# Patient Record
Sex: Female | Born: 1950 | Race: Black or African American | Hispanic: No | Marital: Married | Smoking: Never smoker
Health system: Southern US, Community
[De-identification: ages and names within clinical notes are randomized; demographics above are authoritative.]

## PROBLEM LIST (undated history)

## (undated) DIAGNOSIS — E079 Disorder of thyroid, unspecified: Secondary | ICD-10-CM

## (undated) DIAGNOSIS — N189 Chronic kidney disease, unspecified: Secondary | ICD-10-CM

## (undated) DIAGNOSIS — F329 Major depressive disorder, single episode, unspecified: Secondary | ICD-10-CM

## (undated) DIAGNOSIS — E039 Hypothyroidism, unspecified: Secondary | ICD-10-CM

## (undated) DIAGNOSIS — M1990 Unspecified osteoarthritis, unspecified site: Secondary | ICD-10-CM

## (undated) DIAGNOSIS — K219 Gastro-esophageal reflux disease without esophagitis: Secondary | ICD-10-CM

## (undated) DIAGNOSIS — E785 Hyperlipidemia, unspecified: Secondary | ICD-10-CM

## (undated) DIAGNOSIS — F32A Depression, unspecified: Secondary | ICD-10-CM

## (undated) DIAGNOSIS — Z98811 Dental restoration status: Secondary | ICD-10-CM

## (undated) HISTORY — PX: ABDOMINAL HYSTERECTOMY: SHX81

## (undated) HISTORY — DX: Gastro-esophageal reflux disease without esophagitis: K21.9

## (undated) HISTORY — DX: Hyperlipidemia, unspecified: E78.5

## (undated) HISTORY — DX: Major depressive disorder, single episode, unspecified: F32.9

## (undated) HISTORY — DX: Disorder of thyroid, unspecified: E07.9

## (undated) HISTORY — PX: BILATERAL OOPHORECTOMY: SHX1221

## (undated) HISTORY — DX: Depression, unspecified: F32.A

## (undated) HISTORY — PX: KNEE ARTHROSCOPY: SHX127

## (undated) HISTORY — PX: OTHER SURGICAL HISTORY: SHX169

## (undated) HISTORY — PX: TONSILLECTOMY: SUR1361

## (undated) HISTORY — DX: Unspecified osteoarthritis, unspecified site: M19.90

---

## 2005-06-03 ENCOUNTER — Ambulatory Visit: Payer: Self-pay

## 2006-05-12 ENCOUNTER — Ambulatory Visit: Payer: Self-pay | Admitting: Family Medicine

## 2006-06-02 ENCOUNTER — Ambulatory Visit (HOSPITAL_COMMUNITY): Admission: RE | Admit: 2006-06-02 | Discharge: 2006-06-02 | Payer: Self-pay | Admitting: Gynecology

## 2006-06-22 ENCOUNTER — Ambulatory Visit: Payer: Self-pay | Admitting: Family Medicine

## 2006-07-15 ENCOUNTER — Ambulatory Visit: Payer: Self-pay | Admitting: Gastroenterology

## 2006-09-15 ENCOUNTER — Ambulatory Visit: Payer: Self-pay | Admitting: Family Medicine

## 2006-11-07 ENCOUNTER — Ambulatory Visit: Payer: Self-pay | Admitting: Family Medicine

## 2007-06-16 DIAGNOSIS — D259 Leiomyoma of uterus, unspecified: Secondary | ICD-10-CM | POA: Insufficient documentation

## 2007-06-23 ENCOUNTER — Ambulatory Visit: Payer: Self-pay | Admitting: Family Medicine

## 2007-12-30 ENCOUNTER — Ambulatory Visit: Payer: Self-pay | Admitting: Family Medicine

## 2008-02-01 ENCOUNTER — Ambulatory Visit: Payer: Self-pay | Admitting: Specialist

## 2008-07-04 ENCOUNTER — Ambulatory Visit: Payer: Self-pay | Admitting: Family Medicine

## 2008-09-07 ENCOUNTER — Ambulatory Visit: Payer: Self-pay | Admitting: Family Medicine

## 2008-09-12 ENCOUNTER — Encounter: Payer: Self-pay | Admitting: Family Medicine

## 2008-10-02 ENCOUNTER — Encounter: Payer: Self-pay | Admitting: Family Medicine

## 2008-10-30 ENCOUNTER — Encounter: Payer: Self-pay | Admitting: Family Medicine

## 2008-12-15 ENCOUNTER — Ambulatory Visit: Payer: Self-pay | Admitting: Family Medicine

## 2008-12-27 ENCOUNTER — Ambulatory Visit: Payer: Self-pay | Admitting: Family Medicine

## 2009-01-22 ENCOUNTER — Encounter: Payer: Self-pay | Admitting: Orthopedic Surgery

## 2009-01-30 ENCOUNTER — Encounter: Payer: Self-pay | Admitting: Orthopedic Surgery

## 2009-03-01 ENCOUNTER — Encounter: Payer: Self-pay | Admitting: Orthopedic Surgery

## 2009-07-05 ENCOUNTER — Ambulatory Visit: Payer: Self-pay | Admitting: Family Medicine

## 2009-08-17 ENCOUNTER — Ambulatory Visit: Payer: Self-pay | Admitting: Specialist

## 2009-08-29 ENCOUNTER — Ambulatory Visit: Payer: Self-pay | Admitting: Specialist

## 2009-12-26 ENCOUNTER — Ambulatory Visit: Payer: Self-pay | Admitting: Anesthesiology

## 2010-07-09 ENCOUNTER — Ambulatory Visit: Payer: Self-pay | Admitting: Family Medicine

## 2011-07-29 ENCOUNTER — Ambulatory Visit: Payer: Self-pay | Admitting: Family Medicine

## 2011-12-23 ENCOUNTER — Ambulatory Visit: Payer: Self-pay | Admitting: Family Medicine

## 2011-12-23 LAB — CREATININE, SERUM
Creatinine: 1.09 mg/dL (ref 0.60–1.30)
EGFR (Non-African Amer.): 55 — ABNORMAL LOW

## 2012-04-15 ENCOUNTER — Ambulatory Visit: Payer: Self-pay | Admitting: Family Medicine

## 2012-08-02 ENCOUNTER — Ambulatory Visit: Payer: Self-pay | Admitting: Family Medicine

## 2012-08-14 ENCOUNTER — Emergency Department: Payer: Self-pay | Admitting: Emergency Medicine

## 2012-08-14 LAB — COMPREHENSIVE METABOLIC PANEL
Alkaline Phosphatase: 70 U/L (ref 50–136)
BUN: 21 mg/dL — ABNORMAL HIGH (ref 7–18)
Bilirubin,Total: 0.4 mg/dL (ref 0.2–1.0)
Chloride: 105 mmol/L (ref 98–107)
Creatinine: 1.02 mg/dL (ref 0.60–1.30)
EGFR (African American): >60
EGFR (Non-African Amer.): 59 — ABNORMAL LOW
SGOT(AST): 25 U/L (ref 15–37)
SGPT (ALT): 20 U/L (ref 12–78)
Total Protein: 7.8 g/dL (ref 6.4–8.2)

## 2012-08-14 LAB — CBC
HCT: 39.5 % (ref 35.0–47.0)
HGB: 12.9 g/dL (ref 12.0–16.0)
MCV: 91 fL (ref 80–100)
Platelet: 294 10*3/uL (ref 150–440)
RBC: 4.35 10*6/uL (ref 3.80–5.20)
WBC: 9.2 10*3/uL (ref 3.6–11.0)

## 2012-11-18 ENCOUNTER — Ambulatory Visit: Payer: Self-pay | Admitting: Family Medicine

## 2012-11-22 ENCOUNTER — Encounter: Payer: Self-pay | Admitting: Family Medicine

## 2012-11-30 ENCOUNTER — Encounter: Payer: Self-pay | Admitting: Family Medicine

## 2012-12-04 ENCOUNTER — Emergency Department: Payer: Self-pay | Admitting: Emergency Medicine

## 2012-12-09 ENCOUNTER — Ambulatory Visit: Payer: Self-pay | Admitting: Family Medicine

## 2012-12-30 ENCOUNTER — Encounter: Payer: Self-pay | Admitting: Family Medicine

## 2013-01-30 ENCOUNTER — Encounter: Payer: Self-pay | Admitting: Family Medicine

## 2013-05-16 ENCOUNTER — Ambulatory Visit: Payer: Self-pay | Admitting: Family Medicine

## 2013-06-01 ENCOUNTER — Ambulatory Visit: Payer: Self-pay | Admitting: Family Medicine

## 2013-08-03 ENCOUNTER — Ambulatory Visit: Payer: Self-pay | Admitting: Family Medicine

## 2013-09-20 ENCOUNTER — Encounter: Payer: Self-pay | Admitting: Obstetrics & Gynecology

## 2013-09-20 ENCOUNTER — Ambulatory Visit (INDEPENDENT_AMBULATORY_CARE_PROVIDER_SITE_OTHER): Payer: MEDICARE | Admitting: Obstetrics & Gynecology

## 2013-09-20 VITALS — BP 131/78 | HR 72 | Ht 67.0 in | Wt 176.6 lb

## 2013-09-20 DIAGNOSIS — Z113 Encounter for screening for infections with a predominantly sexual mode of transmission: Secondary | ICD-10-CM

## 2013-09-20 NOTE — Progress Notes (Signed)
Husband passed away 18 months ago and she recently became sexually active again in early January and did not use protection so she would like to be checked out.

## 2013-09-20 NOTE — Progress Notes (Signed)
   Subjective:    Patient ID: Lauren Johnston, female    DOB: [birth year], 63 y.o.   MRN: [id number]  HPI This lovely 63 yo lady had unprotected intercourse 09/03/13 and would like STI testing. She has absolutely no symptoms or complaints.   Review of Systems Her health maintenance is UTD, including flu vaccine, mammo and breast exam.    Objective:   Physical Exam        Assessment & Plan:  Check for GC/CT today and draw blood in 6 weeks for blood-bourne STIs.

## 2013-09-21 LAB — GC/CHLAMYDIA PROBE AMP, URINE
CHLAMYDIA, SWAB/URINE, PCR: NEGATIVE
GC PROBE AMP, URINE: NEGATIVE

## 2013-11-02 ENCOUNTER — Other Ambulatory Visit (INDEPENDENT_AMBULATORY_CARE_PROVIDER_SITE_OTHER): Payer: MEDICARE | Admitting: *Deleted

## 2013-11-02 DIAGNOSIS — Z113 Encounter for screening for infections with a predominantly sexual mode of transmission: Secondary | ICD-10-CM

## 2013-11-02 NOTE — Progress Notes (Signed)
Pt came in today for STD testing

## 2013-11-03 LAB — HEPATITIS B SURFACE ANTIGEN: Hepatitis B Surface Ag: NEGATIVE

## 2013-11-03 LAB — HEPATITIS C ANTIBODY: HCV AB: NEGATIVE

## 2013-11-03 LAB — HIV ANTIBODY (ROUTINE TESTING W REFLEX): HIV: NONREACTIVE

## 2013-11-03 LAB — RPR

## 2014-02-20 DIAGNOSIS — E042 Nontoxic multinodular goiter: Secondary | ICD-10-CM | POA: Insufficient documentation

## 2014-02-20 DIAGNOSIS — E890 Postprocedural hypothyroidism: Secondary | ICD-10-CM | POA: Insufficient documentation

## 2014-07-05 ENCOUNTER — Ambulatory Visit: Payer: Self-pay | Admitting: Family Medicine

## 2014-10-24 ENCOUNTER — Ambulatory Visit: Payer: Self-pay | Admitting: Family Medicine

## 2015-03-14 ENCOUNTER — Ambulatory Visit: Payer: Self-pay | Admitting: Family Medicine

## 2015-04-23 ENCOUNTER — Ambulatory Visit (INDEPENDENT_AMBULATORY_CARE_PROVIDER_SITE_OTHER): Payer: Medicare Other | Admitting: Family Medicine

## 2015-04-23 ENCOUNTER — Encounter: Payer: Self-pay | Admitting: Family Medicine

## 2015-04-23 ENCOUNTER — Encounter (INDEPENDENT_AMBULATORY_CARE_PROVIDER_SITE_OTHER): Payer: Self-pay

## 2015-04-23 VITALS — BP 120/78 | HR 77 | Temp 97.6°F | Resp 16 | Ht 66.0 in | Wt 200.6 lb

## 2015-04-23 DIAGNOSIS — Z23 Encounter for immunization: Secondary | ICD-10-CM | POA: Diagnosis not present

## 2015-04-23 DIAGNOSIS — G4700 Insomnia, unspecified: Secondary | ICD-10-CM | POA: Diagnosis not present

## 2015-04-23 DIAGNOSIS — F325 Major depressive disorder, single episode, in full remission: Secondary | ICD-10-CM | POA: Insufficient documentation

## 2015-04-23 DIAGNOSIS — F329 Major depressive disorder, single episode, unspecified: Secondary | ICD-10-CM | POA: Diagnosis not present

## 2015-04-23 DIAGNOSIS — E890 Postprocedural hypothyroidism: Secondary | ICD-10-CM | POA: Diagnosis not present

## 2015-04-23 DIAGNOSIS — E042 Nontoxic multinodular goiter: Secondary | ICD-10-CM | POA: Diagnosis not present

## 2015-04-23 DIAGNOSIS — G8929 Other chronic pain: Secondary | ICD-10-CM | POA: Insufficient documentation

## 2015-04-23 DIAGNOSIS — M19212 Secondary osteoarthritis, left shoulder: Secondary | ICD-10-CM

## 2015-04-23 DIAGNOSIS — E7800 Pure hypercholesterolemia, unspecified: Secondary | ICD-10-CM

## 2015-04-23 DIAGNOSIS — F32A Depression, unspecified: Secondary | ICD-10-CM

## 2015-04-23 MED ORDER — GABAPENTIN 300 MG PO CAPS: 300.0000 mg | ORAL_CAPSULE | Freq: Three times a day (TID) | ORAL | Status: DC

## 2015-04-23 MED ORDER — LEVOTHYROXINE SODIUM 50 MCG PO TABS: 50.0000 ug | ORAL_TABLET | Freq: Every day | ORAL | Status: DC

## 2015-04-23 MED ORDER — DULOXETINE HCL 60 MG PO CPEP: 60.0000 mg | ORAL_CAPSULE | Freq: Every day | ORAL | Status: DC

## 2015-04-23 MED ORDER — ESTRADIOL 0.5 MG PO TABS
0.5000 mg | ORAL_TABLET | Freq: Every day | ORAL | Status: DC
Start: 2015-04-23 — End: 2016-01-16

## 2015-04-23 MED ORDER — ZOLPIDEM TARTRATE ER 12.5 MG PO TBCR: 12.5000 mg | EXTENDED_RELEASE_TABLET | Freq: Every evening | ORAL | Status: DC | PRN

## 2015-04-23 NOTE — Patient Instructions (Signed)

## 2015-04-24 DIAGNOSIS — Z23 Encounter for immunization: Secondary | ICD-10-CM | POA: Insufficient documentation

## 2015-04-24 NOTE — Progress Notes (Signed)
Name: Lauren Johnston   MRN: [id number]    DOB: [birth year]   Date:04/24/2015       Progress Note  Subjective  Chief Complaint  Chief Complaint  Patient presents with  . Insomnia  . Hyperlipidemia  . Depression  . Pain    HPI  Insomnia problem  Patient has a long-standing history of insomnia. Most recently her zolpidem was switched 10 mg daily at bedtime and she states she's having early awakening.  Left shoulder pain  Complaint of pain and discomfort in the left shoulder is 3 weeks. No history of any significant trauma. The pain is most noted when she pulls her has overhead such as dimpling of her blouse or her brassiere. It is also noted when she abducts the shoulder. There's been no history of any antecedent trauma.  Hypothyroidism  Long-standing history of hypothyroidism for over 5 years she is currently cysts Synthroid 50 g daily. Currently no problem with any weight loss night sweats.: Intolerance significant hair loss .  Hyperlipidemia  Patient currently on a regimen of atorvastatin 20 mg once daily. She is tolerating this well cardiac risk factors include hyperlipidemia and postmenopausal state relative inactivity  Depression. Patient currently on Cymbalta 60 mg once daily. This continues to do well.  Past Medical History  Diagnosis Date  . Arthritis   . Hyperlipidemia   . Thyroid disease     Social History  Substance Use Topics  . Smoking status: Never Smoker   . Smokeless tobacco: Not on file  . Alcohol Use: No     Current outpatient prescriptions:  .  aspirin 81 MG tablet, Take 81 mg by mouth daily., Disp: , Rfl:  .  DULoxetine (CYMBALTA) 60 MG capsule, Take 1 capsule (60 mg total) by mouth daily., Disp: 90 capsule, Rfl: 1 .  estradiol (ESTRACE) 0.5 MG tablet, Take 1 tablet (0.5 mg total) by mouth daily., Disp: 90 tablet, Rfl: 1 .  gabapentin (NEURONTIN) 300 MG capsule, Take 1 capsule (300 mg total) by mouth 3 (three) times daily., Disp: 90 capsule,  Rfl: 1 .  levothyroxine (SYNTHROID, LEVOTHROID) 50 MCG tablet, Take 1 tablet (50 mcg total) by mouth daily before breakfast., Disp: 90 tablet, Rfl: 1 .  zolpidem (AMBIEN CR) 12.5 MG CR tablet, Take 1 tablet (12.5 mg total) by mouth at bedtime as needed for sleep., Disp: 90 tablet, Rfl: 1  Allergies  Allergen Reactions  . Sulfamethoxazole-Trimethoprim Other (See Comments) and Rash  . Tetracycline Other (See Comments)    Other Reaction: URTICARIA  . Tetracyclines & Related Itching  . Codeine Itching    Review of Systems  Constitutional: Negative for fever, chills and weight loss.  HENT: Negative for congestion, hearing loss, sore throat and tinnitus.   Eyes: Negative for blurred vision, double vision and redness.  Respiratory: Negative for cough, hemoptysis and shortness of breath.   Cardiovascular: Negative for chest pain, palpitations, orthopnea, claudication and leg swelling.  Gastrointestinal: Negative for heartburn, nausea, vomiting, diarrhea, constipation and blood in stool.  Genitourinary: Negative for dysuria, urgency, frequency and hematuria.  Musculoskeletal: Positive for back pain and joint pain (Left shoulder pain). Negative for myalgias, falls and neck pain.  Skin: Negative for itching.  Neurological: Negative for dizziness, tingling, tremors, focal weakness, seizures, loss of consciousness, weakness and headaches.  Endo/Heme/Allergies: Does not bruise/bleed easily.  Psychiatric/Behavioral: Positive for depression. Negative for substance abuse. The patient is not nervous/anxious and does not have insomnia.      Objective  Filed Vitals:  04/23/15 1113  BP: 120/78  Pulse: 77  Temp: 97.6 F (36.4 C)  Resp: 16  Height: 5\' 6"  (1.676 m)  Weight: 200 lb 9 oz (90.975 kg)  SpO2: 97%     Physical Exam  Constitutional: She is oriented to person, place, and time and well-developed, well-nourished, and in no distress.  HENT:  Head: Normocephalic.  Eyes: EOM are normal.  Pupils are equal, round, and reactive to light.  Neck: Normal range of motion. No thyromegaly present.  Cardiovascular: Normal rate, regular rhythm and normal heart sounds.   No murmur heard. Pulmonary/Chest: Effort normal and breath sounds normal.  Abdominal: Soft. Bowel sounds are normal.  Musculoskeletal: She exhibits no edema.  Crepitus and pain noted with extreme abduction of the left shoulder distal range of motion of the C-spine O flexion-extension  Neurological: She is alert and oriented to person, place, and time. No cranial nerve deficit. Gait normal.  Skin: Skin is warm and dry. No rash noted.  Psychiatric:  Somewhat anxious and loquacious      Assessment & Plan  1. Goiter, nontoxic, multinodular Stabl   2. Hypothyroidism, postablative Stable  3. Hypercholesterolemia   4. Insomnia Change zolpidem 12.5 mg  5. Depression Continue duloxetine  6. Chronic pain Continue Ultram as needed  7. Immunization due  - Flu Vaccine QUAD 36+ mos PF IM (Fluarix & Fluzone Quad PF)

## 2015-08-23 ENCOUNTER — Ambulatory Visit: Admitting: Family Medicine

## 2015-08-29 ENCOUNTER — Encounter: Payer: Self-pay | Admitting: Family Medicine

## 2015-08-29 ENCOUNTER — Ambulatory Visit (INDEPENDENT_AMBULATORY_CARE_PROVIDER_SITE_OTHER): Payer: Medicare Other | Admitting: Family Medicine

## 2015-08-29 VITALS — BP 110/70 | HR 78 | Temp 98.6°F | Resp 16 | Ht 66.0 in | Wt 199.3 lb

## 2015-08-29 DIAGNOSIS — F329 Major depressive disorder, single episode, unspecified: Secondary | ICD-10-CM | POA: Diagnosis not present

## 2015-08-29 DIAGNOSIS — G8929 Other chronic pain: Secondary | ICD-10-CM | POA: Diagnosis not present

## 2015-08-29 DIAGNOSIS — E7800 Pure hypercholesterolemia, unspecified: Secondary | ICD-10-CM | POA: Diagnosis not present

## 2015-08-29 DIAGNOSIS — E785 Hyperlipidemia, unspecified: Secondary | ICD-10-CM

## 2015-08-29 DIAGNOSIS — F419 Anxiety disorder, unspecified: Secondary | ICD-10-CM

## 2015-08-29 DIAGNOSIS — E890 Postprocedural hypothyroidism: Secondary | ICD-10-CM

## 2015-08-29 DIAGNOSIS — G4700 Insomnia, unspecified: Secondary | ICD-10-CM | POA: Diagnosis not present

## 2015-08-29 DIAGNOSIS — F32A Depression, unspecified: Secondary | ICD-10-CM

## 2015-08-29 DIAGNOSIS — Z23 Encounter for immunization: Secondary | ICD-10-CM | POA: Diagnosis not present

## 2015-08-29 DIAGNOSIS — E042 Nontoxic multinodular goiter: Secondary | ICD-10-CM | POA: Diagnosis not present

## 2015-08-29 MED ORDER — ESTRADIOL 0.5 MG PO TABS: 0.5000 mg | ORAL_TABLET | Freq: Every day | ORAL | Status: DC

## 2015-08-29 MED ORDER — ATORVASTATIN CALCIUM 20 MG PO TABS: 20.0000 mg | ORAL_TABLET | Freq: Every day | ORAL | Status: DC

## 2015-08-29 MED ORDER — LEVOTHYROXINE SODIUM 50 MCG PO TABS: 50.0000 ug | ORAL_TABLET | Freq: Every day | ORAL | Status: DC

## 2015-08-29 MED ORDER — DULOXETINE HCL 60 MG PO CPEP: 60.0000 mg | ORAL_CAPSULE | Freq: Every day | ORAL | Status: DC

## 2015-08-29 MED ORDER — GABAPENTIN 300 MG PO CAPS: 300.0000 mg | ORAL_CAPSULE | Freq: Three times a day (TID) | ORAL | Status: DC

## 2015-08-29 MED ORDER — ZOLPIDEM TARTRATE ER 12.5 MG PO TBCR: 12.5000 mg | EXTENDED_RELEASE_TABLET | Freq: Every evening | ORAL | Status: DC | PRN

## 2015-08-29 MED ORDER — LORAZEPAM 0.5 MG PO TABS: 0.5000 mg | ORAL_TABLET | Freq: Two times a day (BID) | ORAL | Status: DC | PRN

## 2015-08-29 NOTE — Progress Notes (Signed)
Name: Lauren Johnston   MRN: [id number]    DOB: Feb 14, 1951   Date:08/29/2015       Progress Note  Subjective  Chief Complaint  Chief Complaint  Patient presents with  . Depression    4 month recheck  . Hypothyroidism  . Insomnia  . Hyperlipidemia    HPI    hypothyroidism  Patient presents for follow-up of hypothyroidism. It has been present for  Over 5 years.  Current symptoms consist of none . Current medication regimen consist of levothyroxin  levothyroxin 50 micrograms micrograms daily .   There is good compliance with regimen.    Hyperlipidemia  Patient has a history of hyperlipidemia for  Over 5 years.  Current medical regimen consist of  Atorvastatin 20 mg daily at bedtime .  Compliance is  good .  Diet and exercise are currently followed  irregularly .  Risk factors for cardiovascular disease include hyperlipidemia  hypertension .   There have been no side effects from the medication.    Insomnia history of present illness   long-standing history of insomnia. It acutely worsened the death of her husband several years ago. She describes difficulty falling asleep more difficulty with staying asleep. Ambien has been effective in the CR formulation.     Depression history of present illness   several year history of depression since the death of her husband. She still (about him and has crying episodes and time to time. She has not been suicidal or had hallucinations. She is currently on Cymbalta 60 mg daily. She is trying to go on with life with her family other family members and has some new female friend at times.    Anxiety to situational  Disturbance.   anxiety patient's has intermittent episodes of anxiety symptomatology. Patient has significant panic attacks associated with the stressors of her former husband who died recently and in her relations with the children that she had by that marriage.   Arthritis and low back pain.    long-standing history of degenerative  disc disease along with joint pain from arthritis. She takes over-the-counter NSAIDs and gabapentin.    Past Medical History  Diagnosis Date  . Arthritis   . Hyperlipidemia   . Thyroid disease     Social History  Substance Use Topics  . Smoking status: Never Smoker   . Smokeless tobacco: Not on file  . Alcohol Use: No     Current outpatient prescriptions:  .  aspirin 81 MG tablet, Take 81 mg by mouth daily., Disp: , Rfl:  .  atorvastatin (LIPITOR) 20 MG tablet, Take 1 tablet (20 mg total) by mouth daily., Disp: 90 tablet, Rfl: 1 .  DULoxetine (CYMBALTA) 60 MG capsule, Take 1 capsule (60 mg total) by mouth daily., Disp: 90 capsule, Rfl: 1 .  DULoxetine (CYMBALTA) 60 MG capsule, Take 1 capsule (60 mg total) by mouth daily., Disp: 90 capsule, Rfl: 1 .  estradiol (ESTRACE) 0.5 MG tablet, Take 1 tablet (0.5 mg total) by mouth daily., Disp: 90 tablet, Rfl: 1 .  estradiol (ESTRACE) 0.5 MG tablet, Take 1 tablet (0.5 mg total) by mouth daily., Disp: 90 tablet, Rfl: 1 .  gabapentin (NEURONTIN) 300 MG capsule, Take 1 capsule (300 mg total) by mouth 3 (three) times daily., Disp: 270 capsule, Rfl: 1 .  levothyroxine (SYNTHROID, LEVOTHROID) 50 MCG tablet, Take 1 tablet (50 mcg total) by mouth daily before breakfast., Disp: 90 tablet, Rfl: 1 .  zolpidem (AMBIEN CR) 12.5 MG CR  tablet, Take 1 tablet (12.5 mg total) by mouth at bedtime as needed for sleep., Disp: 90 tablet, Rfl: 1  Allergies  Allergen Reactions  . Sulfamethoxazole-Trimethoprim Other (See Comments) and Rash  . Tetracycline Other (See Comments)    Other Reaction: URTICARIA  . Tetracyclines & Related Itching  . Codeine Itching    Review of Systems  Constitutional: Negative for fever, chills and weight loss.  HENT: Negative for congestion, hearing loss, sore throat and tinnitus.   Eyes: Negative for blurred vision, double vision and redness.  Respiratory: Negative for cough, hemoptysis and shortness of breath.   Cardiovascular:  Negative for chest pain, palpitations, orthopnea, claudication and leg swelling.  Gastrointestinal: Negative for heartburn, nausea, vomiting, diarrhea, constipation and blood in stool.  Genitourinary: Negative for dysuria, urgency, frequency and hematuria.  Musculoskeletal: Positive for back pain and joint pain. Negative for myalgias, falls and neck pain.  Skin: Negative for itching.  Neurological: Negative for dizziness, tingling, tremors, focal weakness, seizures, loss of consciousness, weakness and headaches.  Endo/Heme/Allergies: Does not bruise/bleed easily.  Psychiatric/Behavioral: Positive for depression. Negative for suicidal ideas, hallucinations, memory loss and substance abuse. The patient is not nervous/anxious and does not have insomnia.      Objective  Filed Vitals:   08/29/15 1202  BP: 110/70  Pulse: 78  Temp: 98.6 F (37 C)  TempSrc: Oral  Resp: 16  Height: 5\' 6"  (1.676 m)  Weight: 199 lb 4.8 oz (90.402 kg)  SpO2: 95%     Physical Exam  Constitutional: She is oriented to person, place, and time.  Family obese in no acute distress  HENT:  Head: Normocephalic.  Eyes: EOM are normal. Pupils are equal, round, and reactive to light.  Neck: Normal range of motion. No thyromegaly present.  Cardiovascular: Normal rate, regular rhythm and normal heart sounds.   No murmur heard. Pulmonary/Chest: Effort normal and breath sounds normal.  Abdominal: Soft. Bowel sounds are normal.  Musculoskeletal: Normal range of motion. She exhibits tenderness. She exhibits no edema.  Neurological: She is alert and oriented to person, place, and time. No cranial nerve deficit. Gait normal.  Skin: Skin is warm and dry. No rash noted.  Psychiatric: Memory normal.  Rather anxious and loquacious and today      Assessment & Plan  1. Insomnia  - zolpidem (AMBIEN CR) 12.5 MG CR tablet; Take 1 tablet (12.5 mg total) by mouth at bedtime as needed for sleep.  Dispense: 90 tablet; Refill:  1 - Comprehensive Metabolic Panel (CMET)  2. Hyperlipidemia  atorvastatin (LIPITOR) 20 MG tablet; Take 1 tablet (20 mg total) by mouth daily.  Dispense: 90 tablet; Refill: 1 - Lipid panel  3. Acute anxiety  situational related to family issues - LORazepam (ATIVAN) 0.5 MG tablet; Take 1 tablet (0.5 mg total) by mouth 2 (two) times daily as needed for anxiety.  Dispense: 30 tablet; Refill: 1 - Comprehensive Metabolic Panel (CMET)  4. Hypothyroidism, postablative  - levothyroxine (SYNTHROID, LEVOTHROID) 50 MCG tablet; Take 1 tablet (50 mcg total) by mouth daily before breakfast.  Dispense: 90 tablet; Refill: 1  5. Goiter, nontoxic, multinodular   6. Immunization due  given  7. Hypercholesterolemia  continue atorvastatin  8. Chronic pain  continue current meds - gabapentin (NEURONTIN) 300 MG capsule; Take 1 capsule (300 mg total) by mouth 3 (three) times daily.  Dispense: 270 capsule; Refill: 1  9. Depression  stable on Cymbalta with exacerbation with some anxiety due to family stressors - DULoxetine (CYMBALTA) 53  MG capsule; Take 1 capsule (60 mg total) by mouth daily.  Dispense: 90 capsule; Refill: 1

## 2015-08-30 LAB — COMPREHENSIVE METABOLIC PANEL
ALK PHOS: 73 IU/L (ref 39–117)
ALT: 15 IU/L (ref 0–32)
AST: 20 IU/L (ref 0–40)
Albumin/Globulin Ratio: 1.3 (ref 1.1–2.5)
Albumin: 4 g/dL (ref 3.6–4.8)
BUN/Creatinine Ratio: 16 (ref 11–26)
BUN: 16 mg/dL (ref 8–27)
Bilirubin Total: 0.5 mg/dL (ref 0.0–1.2)
CALCIUM: 9.3 mg/dL (ref 8.7–10.3)
CO2: 23 mmol/L (ref 18–29)
CREATININE: 0.98 mg/dL (ref 0.57–1.00)
Chloride: 102 mmol/L (ref 96–106)
GFR calc Af Amer: 70 mL/min/{1.73_m2} (ref >59)
GFR, EST NON AFRICAN AMERICAN: 61 mL/min/{1.73_m2} (ref >59)
GLOBULIN, TOTAL: 3.1 g/dL (ref 1.5–4.5)
GLUCOSE: 88 mg/dL (ref 65–99)
Potassium: 4.2 mmol/L (ref 3.5–5.2)
SODIUM: 141 mmol/L (ref 134–144)
Total Protein: 7.1 g/dL (ref 6.0–8.5)

## 2015-08-30 LAB — LIPID PANEL
CHOL/HDL RATIO: 2.8 ratio (ref 0.0–4.4)
CHOLESTEROL TOTAL: 163 mg/dL (ref 100–199)
HDL: 58 mg/dL (ref >39)
LDL CALC: 92 mg/dL (ref 0–99)
TRIGLYCERIDES: 63 mg/dL (ref 0–149)
VLDL CHOLESTEROL CAL: 13 mg/dL (ref 5–40)

## 2015-09-05 ENCOUNTER — Telehealth: Payer: Self-pay | Admitting: Emergency Medicine

## 2015-09-05 NOTE — Telephone Encounter (Signed)
Patient notified of labs.   

## 2015-12-07 ENCOUNTER — Other Ambulatory Visit: Payer: Self-pay

## 2015-12-07 DIAGNOSIS — F419 Anxiety disorder, unspecified: Secondary | ICD-10-CM

## 2015-12-07 DIAGNOSIS — G4700 Insomnia, unspecified: Secondary | ICD-10-CM

## 2015-12-07 MED ORDER — ZOLPIDEM TARTRATE ER 12.5 MG PO TBCR: 12.5000 mg | EXTENDED_RELEASE_TABLET | Freq: Every evening | ORAL | Status: DC | PRN

## 2015-12-07 MED ORDER — LORAZEPAM 0.5 MG PO TABS: 0.5000 mg | ORAL_TABLET | Freq: Two times a day (BID) | ORAL | Status: DC | PRN

## 2016-01-01 ENCOUNTER — Ambulatory Visit: Admitting: Family Medicine

## 2016-01-16 ENCOUNTER — Other Ambulatory Visit: Payer: Self-pay | Admitting: Family Medicine

## 2016-01-16 DIAGNOSIS — E785 Hyperlipidemia, unspecified: Secondary | ICD-10-CM

## 2016-01-16 DIAGNOSIS — F32A Depression, unspecified: Secondary | ICD-10-CM

## 2016-01-16 DIAGNOSIS — G4700 Insomnia, unspecified: Secondary | ICD-10-CM

## 2016-01-16 DIAGNOSIS — F329 Major depressive disorder, single episode, unspecified: Secondary | ICD-10-CM

## 2016-01-16 MED ORDER — ATORVASTATIN CALCIUM 20 MG PO TABS: 20.0000 mg | ORAL_TABLET | Freq: Every day | ORAL | Status: DC

## 2016-01-16 MED ORDER — ESTRADIOL 0.5 MG PO TABS: 0.5000 mg | ORAL_TABLET | Freq: Every day | ORAL | Status: DC

## 2016-01-16 MED ORDER — DULOXETINE HCL 60 MG PO CPEP: 60.0000 mg | ORAL_CAPSULE | Freq: Every day | ORAL | Status: DC

## 2016-01-16 MED ORDER — ZOLPIDEM TARTRATE ER 12.5 MG PO TBCR: 12.5000 mg | EXTENDED_RELEASE_TABLET | Freq: Every evening | ORAL | Status: DC | PRN

## 2016-01-16 NOTE — Telephone Encounter (Signed)
Dr. Rutherford Nail patient: patient has scheduled appointment with Dr Ancil Boozer for medication refills for 02-05-16. She is asking that you please refill Atorvastatin 20mg , Estradiol 0.5mg , Cymbalta 60mg  and Zolpidem 12.5mg . It can now be faxed to [facility]. She will be out by her appointment date.

## 2016-01-16 NOTE — Telephone Encounter (Signed)
Refill request was sent to Dr. Krichna Sowles for approval and submission.  

## 2016-02-05 ENCOUNTER — Encounter: Payer: Self-pay | Admitting: Family Medicine

## 2016-02-05 ENCOUNTER — Ambulatory Visit (INDEPENDENT_AMBULATORY_CARE_PROVIDER_SITE_OTHER): Payer: Medicare Other | Admitting: Family Medicine

## 2016-02-05 VITALS — BP 122/68 | HR 88 | Temp 97.4°F | Resp 16 | Ht 66.0 in | Wt 204.0 lb

## 2016-02-05 DIAGNOSIS — Z79899 Other long term (current) drug therapy: Secondary | ICD-10-CM | POA: Diagnosis not present

## 2016-02-05 DIAGNOSIS — Z23 Encounter for immunization: Secondary | ICD-10-CM | POA: Diagnosis not present

## 2016-02-05 DIAGNOSIS — F325 Major depressive disorder, single episode, in full remission: Secondary | ICD-10-CM

## 2016-02-05 DIAGNOSIS — Z1211 Encounter for screening for malignant neoplasm of colon: Secondary | ICD-10-CM

## 2016-02-05 DIAGNOSIS — L989 Disorder of the skin and subcutaneous tissue, unspecified: Secondary | ICD-10-CM

## 2016-02-05 DIAGNOSIS — M5030 Other cervical disc degeneration, unspecified cervical region: Secondary | ICD-10-CM | POA: Insufficient documentation

## 2016-02-05 DIAGNOSIS — Z1231 Encounter for screening mammogram for malignant neoplasm of breast: Secondary | ICD-10-CM | POA: Diagnosis not present

## 2016-02-05 DIAGNOSIS — E890 Postprocedural hypothyroidism: Secondary | ICD-10-CM | POA: Diagnosis not present

## 2016-02-05 DIAGNOSIS — E2839 Other primary ovarian failure: Secondary | ICD-10-CM

## 2016-02-05 DIAGNOSIS — E785 Hyperlipidemia, unspecified: Secondary | ICD-10-CM | POA: Diagnosis not present

## 2016-02-05 DIAGNOSIS — G4700 Insomnia, unspecified: Secondary | ICD-10-CM

## 2016-02-05 DIAGNOSIS — E559 Vitamin D deficiency, unspecified: Secondary | ICD-10-CM | POA: Insufficient documentation

## 2016-02-05 DIAGNOSIS — E042 Nontoxic multinodular goiter: Secondary | ICD-10-CM

## 2016-02-05 DIAGNOSIS — B001 Herpesviral vesicular dermatitis: Secondary | ICD-10-CM | POA: Insufficient documentation

## 2016-02-05 MED ORDER — LEVOTHYROXINE SODIUM 50 MCG PO TABS: 50.0000 ug | ORAL_TABLET | Freq: Every day | ORAL | Status: DC

## 2016-02-05 MED ORDER — MELATONIN 1 MG PO TABS: 1.0000 | ORAL_TABLET | Freq: Every evening | ORAL | Status: DC

## 2016-02-05 NOTE — Progress Notes (Signed)
Name: Lauren Johnston   MRN: [id number]    DOB: 01/18/1951   Date:02/05/2016       Progress Note  Subjective  Chief Complaint  Chief Complaint  Patient presents with  . Medication Refill    6 month F/U and is due for her colonoscopy  . Insomnia    Patient states she doesn't sleep well, but takes medication most time to help. After taking medication it will still take her 2 hours to fall asleep   . Depression    Cymbalta controls symptoms  . Hypothyroidism    Takes Miralex for constipation and some weight gain   . Hyperlipidemia    Joint pain from medication  . Skin Mole    Patient is concerned about mole on right side of forehead that is changing texture and becoming itchy. Would like it checked out.    HPI  Insomnia: she has been taking Ambien every night and helps her but still has difficulty falling asleep at times. Try adding Melatonin before bed. She stays at nursing home until 8 pm, advised to eat dinner at home and have a routine before bed time  Depression Major in Remission: she was very depressed when husband died from complications of CHF in 3790, she was very angry because he did not tell her how sick he was and it caught her by surprise. 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 Advised her to start having hobbies to help her when her mother dies.   Hypothyroidism: taking synthroid daily and denies side effects, she sees Dr. Filbert Berthold  Skin lesion: she has a lesion on right forehead for at least 30 years, but is changing in texture and has been itching over the past couple of months. No change in size that she can tell.   Hyperlipidemia: taking Atorvastatin and denies side effects, no chest pain or myalgia.    Patient Active Problem List   Diagnosis Date Noted  . Vitamin D deficiency 02/05/2016  . Cold sore 02/05/2016  . Degeneration of intervertebral disc of cervical region 02/05/2016  . Insomnia 04/23/2015  . Major  depression in remission ([place]) 04/23/2015  . Chronic pain 04/23/2015  . Goiter, nontoxic, 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  . 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 Uterine leiomyoma 06/16/2007    Past Surgical History  Procedure Laterality Date  . Abdominal hysterectomy    . Bilateral oophorectomy    . Knee arthroscopy    . Dequavian tendonitis      Family History  Problem Relation Age of Onset  . Diabetes Mother   . Emphysema Father   . Multiple myeloma Brother     Social History   Social History  . Marital Status: Widowed    Spouse Name: N/A  . Number of Children: N/A  . Years of Education: N/A   Occupational History  . Not on file.   Social History Main Topics  . Smoking status: Never Smoker   . Smokeless tobacco: Never Used  . Alcohol Use: No  . Drug Use: No  . Sexual Activity:    Partners: Male    Birth Control/ Protection: Post-menopausal   Other Topics Concern  . Not on file   Social History Narrative     Current outpatient prescriptions:  .  aspirin 81 MG tablet, Take 81 mg by mouth daily., Disp: , Rfl:  .  atorvastatin (LIPITOR) 20 MG tablet, Take 1 tablet (20 mg total) by mouth daily.,  Disp: 90 tablet, Rfl: 1 .  DULoxetine (CYMBALTA) 60 MG capsule, Take 1 capsule (60 mg total) by mouth daily., Disp: 90 capsule, Rfl: 1 .  estradiol (ESTRACE) 0.5 MG tablet, Take 1 tablet (0.5 mg total) by mouth daily., Disp: 90 tablet, Rfl: 1 .  gabapentin (NEURONTIN) 300 MG capsule, Take 1 capsule (300 mg total) by mouth 3 (three) times daily., Disp: 270 capsule, Rfl: 1 .  levothyroxine (SYNTHROID, LEVOTHROID) 50 MCG tablet, Take 1 tablet (50 mcg total) by mouth daily before breakfast., Disp: 90 tablet, Rfl: 1 .  LORazepam (ATIVAN) 0.5 MG tablet, Take 1 tablet (0.5 mg total) by mouth 2 (two) times daily as needed for anxiety., Disp: 30 tablet, Rfl: 1 .  zolpidem (AMBIEN CR) 12.5 MG CR tablet, Take 1 tablet (12.5 mg total) by mouth at bedtime as needed for sleep., Disp: 90  tablet, Rfl: 0  Allergies  Allergen Reactions  . Sulfamethoxazole-Trimethoprim Other (See Comments) and Rash  . Citalopram   . Tetracycline Other (See Comments)    Other Reaction: URTICARIA  . Tetracyclines & Related Itching  . Codeine Itching     ROS  Constitutional: Negative for fever or significant weight change.  Respiratory: Negative for cough and shortness of breath.   Cardiovascular: Negative for chest pain or palpitations.  Gastrointestinal: Negative for abdominal pain, no bowel changes.  Musculoskeletal: Negative for gait problem or joint swelling.  Skin: Negative for rash. Lesion skin changed Neurological: Negative for dizziness or headache.  No other specific complaints in a complete review of systems (except as listed in HPI above).  Objective  Filed Vitals:   02/05/16 1600  BP: 132/84  Pulse: 88  Temp: 97.4 F (36.3 C)  TempSrc: Oral  Resp: 16  Height: _0  (1.676 m)  Weight: 204 lb (92.534 kg)  SpO2: 96%    Body mass index is 32.94 kg/(m^2).  Physical Exam  Constitutional: Patient appears well-developed and well-nourished. Obese  No distress.  HEENT: head atraumatic, normocephalic, pupils equal and reactive to light, neck supple, throat within normal limits Cardiovascular: Normal rate, regular rhythm and normal heart sounds.  No murmur heard. No BLE edema. Pulmonary/Chest: Effort normal and breath sounds normal. No respiratory distress. Abdominal: Soft.  There is no tenderness. Psychiatric: Patient has a normal mood and affect. behavior is normal. Judgment and thought content normal. Skin: large SK on right forehead - per patient changing in texture and getting itchy, refer to Dermatologist   PHQ2/9: Depression screen [facility] 2/9 02/05/2016 08/29/2015 04/23/2015  Decreased Interest 0 0 0  Down, Depressed, Hopeless 0 0 0  PHQ - 2 Score 0 0 0    Fall Risk: Fall Risk  02/05/2016 08/29/2015 04/23/2015  Falls in the past year? No No No     Functional  Status Survey: Is the patient deaf or have difficulty hearing?: No Does the patient have difficulty seeing, even when wearing glasses/contacts?: No Does the patient have difficulty concentrating, remembering, or making decisions?: No Does the patient have difficulty walking or climbing stairs?: No Does the patient have difficulty dressing or bathing?: No Does the patient have difficulty doing errands alone such as visiting a doctor's office or shopping?: No    Assessment & Plan  1. Insomnia  Doing well on Ambien CR , add melatonin and discussed sleep hygiene  2. Goiter, nontoxic, multinodular  Continue follow up with Endo - Dr. Filbert Berthold  3. Hyperlipidemia  - Lipid panel  4. Hypothyroidism, postablative  - TSH  5. Major  depression in remission (HCC)  Continue Cymbalta  6. Vitamin D deficiency  - VITAMIN D 25 Hydroxy (Vit-D Deficiency, Fractures)  7. Need for pneumococcal vaccination  - Pneumococcal polysaccharide vaccine 23-valent greater than or equal to 2yo subcutaneous/IM  8. Long-term use of high-risk medication  - Comprehensive metabolic panel  9. Colon cancer screening  - Ambulatory referral to Gastroenterology  10. Ovarian failure  - DG Bone Density; Future  11. Encounter for screening mammogram for breast cancer  - MM Digital Screening; Future  12. Skin lesion of face  - Ambulatory referral to Dermatology

## 2016-03-06 ENCOUNTER — Telehealth: Payer: Self-pay | Admitting: Family Medicine

## 2016-03-06 DIAGNOSIS — Z79899 Other long term (current) drug therapy: Secondary | ICD-10-CM | POA: Diagnosis not present

## 2016-03-06 DIAGNOSIS — E890 Postprocedural hypothyroidism: Secondary | ICD-10-CM | POA: Diagnosis not present

## 2016-03-06 DIAGNOSIS — E559 Vitamin D deficiency, unspecified: Secondary | ICD-10-CM | POA: Diagnosis not present

## 2016-03-06 DIAGNOSIS — E785 Hyperlipidemia, unspecified: Secondary | ICD-10-CM | POA: Diagnosis not present

## 2016-03-06 NOTE — Telephone Encounter (Signed)
Pt would like a call back from Dr Ancil Boozer when available.

## 2016-03-07 LAB — COMPREHENSIVE METABOLIC PANEL
ALBUMIN: 3.8 g/dL (ref 3.6–4.8)
ALT: 15 IU/L (ref 0–32)
AST: 20 IU/L (ref 0–40)
Albumin/Globulin Ratio: 1.2 (ref 1.2–2.2)
Alkaline Phosphatase: 73 IU/L (ref 39–117)
BUN / CREAT RATIO: 12 (ref 12–28)
BUN: 14 mg/dL (ref 8–27)
Bilirubin Total: 0.5 mg/dL (ref 0.0–1.2)
CALCIUM: 8.8 mg/dL (ref 8.7–10.3)
CHLORIDE: 101 mmol/L (ref 96–106)
CO2: 22 mmol/L (ref 18–29)
CREATININE: 1.13 mg/dL — AB (ref 0.57–1.00)
GFR calc Af Amer: 59 mL/min/{1.73_m2} — ABNORMAL LOW (ref >59)
GFR, EST NON AFRICAN AMERICAN: 51 mL/min/{1.73_m2} — AB (ref >59)
GLOBULIN, TOTAL: 3.1 g/dL (ref 1.5–4.5)
GLUCOSE: 94 mg/dL (ref 65–99)
Potassium: 4.5 mmol/L (ref 3.5–5.2)
SODIUM: 140 mmol/L (ref 134–144)
TOTAL PROTEIN: 6.9 g/dL (ref 6.0–8.5)

## 2016-03-07 LAB — LIPID PANEL
CHOL/HDL RATIO: 2.9 ratio (ref 0.0–4.4)
CHOLESTEROL TOTAL: 161 mg/dL (ref 100–199)
HDL: 55 mg/dL (ref >39)
LDL Calculated: 92 mg/dL (ref 0–99)
TRIGLYCERIDES: 72 mg/dL (ref 0–149)
VLDL Cholesterol Cal: 14 mg/dL (ref 5–40)

## 2016-03-07 LAB — VITAMIN D 25 HYDROXY (VIT D DEFICIENCY, FRACTURES): Vit D, 25-Hydroxy: 25.1 ng/mL — ABNORMAL LOW (ref 30.0–100.0)

## 2016-03-07 LAB — TSH: TSH: 1.96 u[IU]/mL (ref 0.450–4.500)

## 2016-03-07 NOTE — Telephone Encounter (Signed)
I contacted this patient to review labs and asked her how we can be of help to her. She asked about her thyroid and said that was it. I encouraged her to give [place] a call back on Monday if she had any other questions.

## 2016-03-07 NOTE — Telephone Encounter (Signed)
Please call patient to find out what she needs to discuss with me

## 2016-03-11 DIAGNOSIS — L821 Other seborrheic keratosis: Secondary | ICD-10-CM | POA: Diagnosis not present

## 2016-03-11 DIAGNOSIS — L820 Inflamed seborrheic keratosis: Secondary | ICD-10-CM | POA: Diagnosis not present

## 2016-03-11 DIAGNOSIS — L815 Leukoderma, not elsewhere classified: Secondary | ICD-10-CM | POA: Diagnosis not present

## 2016-03-11 DIAGNOSIS — D485 Neoplasm of uncertain behavior of skin: Secondary | ICD-10-CM | POA: Diagnosis not present

## 2016-05-07 ENCOUNTER — Encounter: Payer: Self-pay | Admitting: Family Medicine

## 2016-05-07 ENCOUNTER — Ambulatory Visit (INDEPENDENT_AMBULATORY_CARE_PROVIDER_SITE_OTHER): Payer: Medicare Other | Admitting: Family Medicine

## 2016-05-07 VITALS — BP 118/74 | HR 64 | Temp 97.7°F | Resp 16 | Ht 66.0 in | Wt 202.2 lb

## 2016-05-07 DIAGNOSIS — F4321 Adjustment disorder with depressed mood: Secondary | ICD-10-CM

## 2016-05-07 DIAGNOSIS — E559 Vitamin D deficiency, unspecified: Secondary | ICD-10-CM

## 2016-05-07 DIAGNOSIS — M150 Primary generalized (osteo)arthritis: Secondary | ICD-10-CM

## 2016-05-07 DIAGNOSIS — M25552 Pain in left hip: Secondary | ICD-10-CM

## 2016-05-07 DIAGNOSIS — Z1211 Encounter for screening for malignant neoplasm of colon: Secondary | ICD-10-CM

## 2016-05-07 DIAGNOSIS — G4700 Insomnia, unspecified: Secondary | ICD-10-CM | POA: Diagnosis not present

## 2016-05-07 DIAGNOSIS — Z23 Encounter for immunization: Secondary | ICD-10-CM | POA: Diagnosis not present

## 2016-05-07 DIAGNOSIS — F331 Major depressive disorder, recurrent, moderate: Secondary | ICD-10-CM

## 2016-05-07 DIAGNOSIS — E890 Postprocedural hypothyroidism: Secondary | ICD-10-CM | POA: Diagnosis not present

## 2016-05-07 DIAGNOSIS — M159 Polyosteoarthritis, unspecified: Secondary | ICD-10-CM

## 2016-05-07 DIAGNOSIS — E785 Hyperlipidemia, unspecified: Secondary | ICD-10-CM

## 2016-05-07 DIAGNOSIS — M8949 Other hypertrophic osteoarthropathy, multiple sites: Secondary | ICD-10-CM

## 2016-05-07 MED ORDER — ATORVASTATIN CALCIUM 20 MG PO TABS: 20.0000 mg | ORAL_TABLET | Freq: Every day | ORAL | 1 refills | Status: DC

## 2016-05-07 MED ORDER — ZOLPIDEM TARTRATE ER 12.5 MG PO TBCR: 12.5000 mg | EXTENDED_RELEASE_TABLET | Freq: Every evening | ORAL | 0 refills | Status: DC | PRN

## 2016-05-07 MED ORDER — DULOXETINE HCL 60 MG PO CPEP: 60.0000 mg | ORAL_CAPSULE | Freq: Every day | ORAL | 1 refills | Status: DC

## 2016-05-07 NOTE — Addendum Note (Signed)
Addended by: Steele Sizer F on: 05/07/2016 12:09 PM   Modules accepted: Orders

## 2016-05-07 NOTE — Progress Notes (Signed)
Name: Lauren Johnston   MRN: [id number]    DOB: 05-20-1951   Date:05/07/2016       Progress Note  Subjective  Chief Complaint  Chief Complaint  Patient presents with  . Medication Refill    4 month F/U, Needs referra for colonoscopy.  . Insomnia    Patient states medication is the only way she gets sleep, patient mother passed away since last visit.   Marland Kitchen Hypothyroidism    Itchy Skin, Hair Loss- patient takes medication daily  . Hyperlipidemia    Still giving her joint pain  . Toe Pain    Big Toe on Right Foot, Edema and has been giving her pain.    HPI  Insomnia: she has been taking Ambien every night and helps her but still has difficulty falling asleep at times. She forgot to get Melatonin. Discussed going down on dose of Ambien but she states other doses did not work. Discussed FDA.  Depression Major recurrent: she was very depressed when husband died from complications of CHF in 9528, she was very angry because he did not tell her how sick he was and it caught her by surprise. Mother died end of March 02, 2023 and was feeling lost initially, still grieving , started to get out of the house again, but still has anhedonia. Discussed Hospice care  Hypothyroidism: taking synthroid daily and denies side effects, she sees Dr. Filbert Berthold  Skin lesion: she has a lesion on right forehead for at least 30 years, but is changing in texture and has been itching over the past couple of months. No change in size that she can tell.   Hyperlipidemia: taking Atorvastatin and denies side effects, no chest pain or myalgia. Discussed last labs done.   Osteoarthritis: history of problems on right shoulder, also history of bilateral knee surgery. She is now having daily left hip pain/groin pain.    Patient Active Problem List   Diagnosis Date Noted  . Vitamin D deficiency 02/05/2016  . Cold sore 02/05/2016  . Degeneration of intervertebral disc of cervical region 02/05/2016  . Insomnia 04/23/2015  .  Major depression in remission ([place]) 04/23/2015  . Chronic pain 04/23/2015  . Goiter, nontoxic, 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  . 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 Uterine leiomyoma 06/16/2007    Past Surgical History:  Procedure Laterality Date  . ABDOMINAL HYSTERECTOMY    . BILATERAL OOPHORECTOMY    . dequavian tendonitis    . KNEE ARTHROSCOPY      Family History  Problem Relation Age of Onset  . Diabetes Mother   . Emphysema Father   . Multiple myeloma Brother     Social History   Social History  . Marital status: Widowed    Spouse name: N/A  . Number of children: N/A  . Years of education: N/A   Occupational History  . Not on file.   Social History Main Topics  . Smoking status: Never Smoker  . Smokeless tobacco: Never Used  . Alcohol use No  . Drug use: No  . Sexual activity: Yes    Partners: Male    Birth control/ protection: Post-menopausal   Other Topics Concern  . Not on file   Social History Narrative  . No narrative on file     Current Outpatient Prescriptions:  .  aspirin 81 MG tablet, Take 81 mg by mouth daily., Disp: , Rfl:  .  atorvastatin (LIPITOR) 20 MG tablet, Take 1 tablet (20 mg total) by mouth daily., Disp: 90 tablet,  Rfl: 1 .  DULoxetine (CYMBALTA) 60 MG capsule, Take 1 capsule (60 mg total) by mouth daily., Disp: 90 capsule, Rfl: 1 .  estradiol (ESTRACE) 0.5 MG tablet, Take 1 tablet (0.5 mg total) by mouth daily., Disp: 90 tablet, Rfl: 1 .  gabapentin (NEURONTIN) 300 MG capsule, Take 1 capsule (300 mg total) by mouth 3 (three) times daily., Disp: 270 capsule, Rfl: 1 .  levothyroxine (SYNTHROID, LEVOTHROID) 50 MCG tablet, Take 1 tablet (50 mcg total) by mouth daily before breakfast., Disp: 90 tablet, Rfl: 1 .  LORazepam (ATIVAN) 0.5 MG tablet, Take 1 tablet (0.5 mg total) by mouth 2 (two) times daily as needed for anxiety., Disp: 30 tablet, Rfl: 1 .  Melatonin 1 MG TABS, Take 1 tablet (1 mg total) by mouth every evening., Disp: 30  tablet, Rfl: 0 .  zolpidem (AMBIEN CR) 12.5 MG CR tablet, Take 1 tablet (12.5 mg total) by mouth at bedtime as needed for sleep., Disp: 90 tablet, Rfl: 0  Allergies  Allergen Reactions  . Sulfamethoxazole-Trimethoprim Other (See Comments) and Rash  . Citalopram   . Tetracycline Other (See Comments)    Other Reaction: URTICARIA  . Tetracyclines & Related Itching  . Codeine Itching     ROS  Ten systems reviewed and is negative except as mentioned in HPI   Objective  Vitals:   05/07/16 1119  BP: 118/74  Pulse: 64  Resp: 16  Temp: 97.7 F (36.5 C)  TempSrc: Oral  SpO2: 95%  Weight: 202 lb 3.2 oz (91.7 kg)  Height: '5\' 6"'  (1.676 m)    Body mass index is 32.64 kg/m.  Physical Exam  Constitutional: Patient appears well-developed and well-nourished. Obese No distress.  HEENT: head atraumatic, normocephalic, pupils equal and reactive to light,  neck supple, throat within normal limits Cardiovascular: Normal rate, regular rhythm and normal heart sounds.  No murmur heard. No BLE edema. Pulmonary/Chest: Effort normal and breath sounds normal. No respiratory distress. Abdominal: Soft.  There is no tenderness. Psychiatric: Patient has a normal mood and affect. behavior is normal. Judgment and thought content normal. Muscular Skeletal: pain with internal rotation of left hip  Recent Results (from the past 2160 hour(s))  Lipid panel     Status: None   Collection Time: 03/06/16 10:49 AM  Result Value Ref Range   Cholesterol, Total 161 100 - 199 mg/dL   Triglycerides 72 0 - 149 mg/dL   HDL 55 >39 mg/dL   VLDL Cholesterol Cal 14 5 - 40 mg/dL   LDL Calculated 92 0 - 99 mg/dL   Chol/HDL Ratio 2.9 0.0 - 4.4 ratio units    Comment:                                   T. Chol/HDL Ratio                                             Men  Women                               1/2 Avg.Risk  3.4    3.3  Avg.Risk  5.0    4.4                                2X  Avg.Risk  9.6    7.1                                3X Avg.Risk 23.4   11.0   TSH     Status: None   Collection Time: 03/06/16 10:49 AM  Result Value Ref Range   TSH 1.960 0.450 - 4.500 uIU/mL  VITAMIN D 25 Hydroxy (Vit-D Deficiency, Fractures)     Status: Abnormal   Collection Time: 03/06/16 10:49 AM  Result Value Ref Range   Vit D, 25-Hydroxy 25.1 (L) 30.0 - 100.0 ng/mL    Comment: Vitamin D deficiency has been defined by the [place] and an Endocrine Society practice guideline as a level of serum 25-OH vitamin D less than 20 ng/mL (1,2). The Endocrine Society went on to further define vitamin D insufficiency as a level between 21 and 29 ng/mL (2). 1. IOM (Institute of Medicine). 2010. Dietary reference    intakes for calcium and D. [place]: The    [employer]. 2. Holick MF, Binkley [place], Bischoff-Ferrari HA, et al.    Evaluation, treatment, and prevention of vitamin D    deficiency: an Endocrine Society clinical practice    guideline. JCEM. 2011 Jul; 96(7):1911-30.   Comprehensive metabolic panel     Status: Abnormal   Collection Time: 03/06/16 10:49 AM  Result Value Ref Range   Glucose 94 65 - 99 mg/dL   BUN 14 8 - 27 mg/dL   Creatinine, Ser 1.13 (H) 0.57 - 1.00 mg/dL   GFR calc non Af Amer 51 (L) >59 mL/min/1.73   GFR calc Af Amer 59 (L) >59 mL/min/1.73   BUN/Creatinine Ratio 12 12 - 28   Sodium 140 134 - 144 mmol/L   Potassium 4.5 3.5 - 5.2 mmol/L   Chloride 101 96 - 106 mmol/L   CO2 22 18 - 29 mmol/L   Calcium 8.8 8.7 - 10.3 mg/dL   Total Protein 6.9 6.0 - 8.5 g/dL   Albumin 3.8 3.6 - 4.8 g/dL   Globulin, Total 3.1 1.5 - 4.5 g/dL   Albumin/Globulin Ratio 1.2 1.2 - 2.2   Bilirubin Total 0.5 0.0 - 1.2 mg/dL   Alkaline Phosphatase 73 39 - 117 IU/L   AST 20 0 - 40 IU/L   ALT 15 0 - 32 IU/L      PHQ2/9: Depression screen [facility] 2/9 05/07/2016 02/05/2016 08/29/2015 04/23/2015  Decreased Interest 0 0 0 0  Down, Depressed, Hopeless 0 0 0 0   PHQ - 2 Score 0 0 0 0     Fall Risk: Fall Risk  05/07/2016 02/05/2016 08/29/2015 04/23/2015  Falls in the past year? No No No No     Functional Status Survey: Is the patient deaf or have difficulty hearing?: No Does the patient have difficulty seeing, even when wearing glasses/contacts?: No Does the patient have difficulty concentrating, remembering, or making decisions?: No Does the patient have difficulty walking or climbing stairs?: No Does the patient have difficulty dressing or bathing?: No Does the patient have difficulty doing errands alone such as visiting a doctor's office or shopping?: No    Assessment & Plan  1. Moderate recurrent major depression ([place])  -  DULoxetine (CYMBALTA) 60 MG capsule; Take 1 capsule (60 mg total) by mouth daily.  Dispense: 90 capsule; Refill: 1  2. Insomnia  - zolpidem (AMBIEN CR) 12.5 MG CR tablet; Take 1 tablet (12.5 mg total) by mouth at bedtime as needed for sleep.  Dispense: 90 tablet; Refill: 0  3. Needs flu shot  - Flu Vaccine QUAD 36+ mos PF IM (Fluarix & Fluzone Quad PF)  4. Hyperlipidemia  - atorvastatin (LIPITOR) 20 MG tablet; Take 1 tablet (20 mg total) by mouth daily.  Dispense: 90 tablet; Refill: 1  5. Hypothyroidism, postablative  Dr. Filbert Berthold  6. Primary osteoarthritis involving multiple joints   7. Grieving   8. Vitamin D deficiency  Continue otc supplementation   9. Left hip pain  - Ambulatory referral to Orthopedic Surgery ( seen by Dr. Tamala Julian in the past ) She is also having pain on left thumb, history of Dequervains' tenosynovitis surgery

## 2016-05-09 ENCOUNTER — Telehealth: Payer: Self-pay | Admitting: Gastroenterology

## 2016-05-09 NOTE — Telephone Encounter (Signed)
colonoscopy

## 2016-05-12 ENCOUNTER — Other Ambulatory Visit: Payer: Self-pay

## 2016-05-12 NOTE — Telephone Encounter (Signed)
Gastroenterology Pre-Procedure Review  Request Date: 06/02/2016 Requesting Physician: Dr. Rutherford Nail  PATIENT REVIEW QUESTIONS: The patient responded to the following health history questions as indicated:    1. Are you having any GI issues? no 2. Do you have a personal history of Polyps? yes (benign) 3. Do you have a family history of Colon Cancer or Polyps? no 4. Diabetes Mellitus? no 5. Joint replacements in the past 12 months?no 6. Major health problems in the past 3 months?no 7. Any artificial heart valves, MVP, or defibrillator?no    MEDICATIONS & ALLERGIES:    Patient reports the following regarding taking any anticoagulation/antiplatelet therapy:   Plavix, Coumadin, Eliquis, Xarelto, Lovenox, Pradaxa, Brilinta, or Effient? no Aspirin? yes (Heart health)  Patient confirms/reports the following medications:  Current Outpatient Prescriptions  Medication Sig Dispense Refill  . aspirin 81 MG tablet Take 81 mg by mouth daily.    Marland Kitchen atorvastatin (LIPITOR) 20 MG tablet Take 1 tablet (20 mg total) by mouth daily. 90 tablet 1  . DULoxetine (CYMBALTA) 60 MG capsule Take 1 capsule (60 mg total) by mouth daily. 90 capsule 1  . estradiol (ESTRACE) 0.5 MG tablet Take 1 tablet (0.5 mg total) by mouth daily. 90 tablet 1  . gabapentin (NEURONTIN) 300 MG capsule Take 1 capsule (300 mg total) by mouth 3 (three) times daily. 270 capsule 1  . levothyroxine (SYNTHROID, LEVOTHROID) 50 MCG tablet Take 1 tablet (50 mcg total) by mouth daily before breakfast. 90 tablet 1  . zolpidem (AMBIEN CR) 12.5 MG CR tablet Take 1 tablet (12.5 mg total) by mouth at bedtime as needed for sleep. 90 tablet 0  . LORazepam (ATIVAN) 0.5 MG tablet Take 1 tablet (0.5 mg total) by mouth 2 (two) times daily as needed for anxiety. (Patient not taking: Reported on 05/12/2016) 30 tablet 1  . Melatonin 1 MG TABS Take 1 tablet (1 mg total) by mouth every evening. (Patient not taking: Reported on 05/12/2016) 30 tablet 0   No current  facility-administered medications for this visit.     Patient confirms/reports the following allergies:  Allergies  Allergen Reactions  . Sulfamethoxazole-Trimethoprim Other (See Comments) and Rash  . Citalopram   . Tetracycline Other (See Comments)    Other Reaction: URTICARIA  . Tetracyclines & Related Itching  . Codeine Itching    No orders of the defined types were placed in this encounter.   AUTHORIZATION INFORMATION Primary Insurance: 1D#: Group #:  Secondary Insurance: 1D#: Group #:  SCHEDULE INFORMATION: Date: 06/02/2016 Time: Location: [facility]

## 2016-05-12 NOTE — Telephone Encounter (Signed)
Screening Colonoscopy Z12.11 [facility] 06/02/2016 BCBS/ Medicare (no Josem Kaufmann is required)

## 2016-05-15 DIAGNOSIS — M1612 Unilateral primary osteoarthritis, left hip: Secondary | ICD-10-CM | POA: Diagnosis not present

## 2016-05-15 DIAGNOSIS — M778 Other enthesopathies, not elsewhere classified: Secondary | ICD-10-CM | POA: Diagnosis not present

## 2016-05-15 DIAGNOSIS — M25552 Pain in left hip: Secondary | ICD-10-CM | POA: Diagnosis not present

## 2016-05-16 ENCOUNTER — Encounter: Payer: Self-pay | Admitting: Family Medicine

## 2016-05-16 DIAGNOSIS — M778 Other enthesopathies, not elsewhere classified: Secondary | ICD-10-CM | POA: Insufficient documentation

## 2016-05-16 DIAGNOSIS — M1612 Unilateral primary osteoarthritis, left hip: Secondary | ICD-10-CM | POA: Insufficient documentation

## 2016-05-26 ENCOUNTER — Encounter: Payer: Self-pay | Admitting: *Deleted

## 2016-05-29 DIAGNOSIS — M1612 Unilateral primary osteoarthritis, left hip: Secondary | ICD-10-CM | POA: Diagnosis not present

## 2016-05-29 DIAGNOSIS — M25552 Pain in left hip: Secondary | ICD-10-CM | POA: Diagnosis not present

## 2016-05-30 NOTE — Discharge Instructions (Signed)

## 2016-06-02 ENCOUNTER — Encounter
Admission: RE | Disposition: A | Discharge status: Home / Self Care | Payer: Self-pay | Source: Ambulatory Visit | Attending: Gastroenterology

## 2016-06-02 ENCOUNTER — Ambulatory Visit
Admission: RE | Admit: 2016-06-02 | Discharge: 2016-06-02 | Disposition: A | Discharge status: Home / Self Care | Payer: Medicare Other | Source: Ambulatory Visit | Attending: Gastroenterology | Admitting: Gastroenterology

## 2016-06-02 ENCOUNTER — Ambulatory Visit: Payer: Medicare Other | Admitting: Anesthesiology

## 2016-06-02 DIAGNOSIS — K621 Rectal polyp: Secondary | ICD-10-CM

## 2016-06-02 DIAGNOSIS — Z79899 Other long term (current) drug therapy: Secondary | ICD-10-CM | POA: Diagnosis not present

## 2016-06-02 DIAGNOSIS — Z1211 Encounter for screening for malignant neoplasm of colon: Secondary | ICD-10-CM

## 2016-06-02 DIAGNOSIS — E785 Hyperlipidemia, unspecified: Secondary | ICD-10-CM | POA: Diagnosis not present

## 2016-06-02 DIAGNOSIS — E039 Hypothyroidism, unspecified: Secondary | ICD-10-CM | POA: Insufficient documentation

## 2016-06-02 DIAGNOSIS — Z7982 Long term (current) use of aspirin: Secondary | ICD-10-CM | POA: Insufficient documentation

## 2016-06-02 HISTORY — DX: Hypothyroidism, unspecified: E03.9

## 2016-06-02 HISTORY — PX: POLYPECTOMY: SHX5525

## 2016-06-02 HISTORY — DX: Dental restoration status: Z98.811

## 2016-06-02 HISTORY — PX: COLONOSCOPY WITH PROPOFOL: SHX5780

## 2016-06-02 SURGERY — COLONOSCOPY WITH PROPOFOL
Anesthesia: Monitor Anesthesia Care

## 2016-06-02 MED ORDER — ACETAMINOPHEN 160 MG/5ML PO SOLN: 325.0000 mg | ORAL | Status: DC | PRN

## 2016-06-02 MED ORDER — LACTATED RINGERS IV SOLN
INTRAVENOUS | Status: DC
  Administered 2016-06-02: 08:00:00 via INTRAVENOUS

## 2016-06-02 MED ORDER — ACETAMINOPHEN 325 MG PO TABS: 325.0000 mg | ORAL_TABLET | ORAL | Status: DC | PRN

## 2016-06-02 MED ORDER — PROPOFOL 10 MG/ML IV BOLUS
INTRAVENOUS | Status: DC | PRN
  Administered 2016-06-02: 20 mg via INTRAVENOUS
  Administered 2016-06-02: 30 mg via INTRAVENOUS
  Administered 2016-06-02: 50 mg via INTRAVENOUS
  Administered 2016-06-02: 100 mg via INTRAVENOUS

## 2016-06-02 MED ORDER — SODIUM CHLORIDE 0.9 % IJ SOLN
PREFILLED_SYRINGE | INTRAMUSCULAR | Status: DC | PRN
  Administered 2016-06-02: 1 mL

## 2016-06-02 MED ORDER — LIDOCAINE HCL (CARDIAC) 20 MG/ML IV SOLN
INTRAVENOUS | Status: DC | PRN
  Administered 2016-06-02: 50 mg via INTRAVENOUS

## 2016-06-02 SURGICAL SUPPLY — 23 items
CANISTER SUCT 1200ML W/VALVE (MISCELLANEOUS) ×3 IMPLANT
CLIP HMST 235XBRD CATH ROT (MISCELLANEOUS) IMPLANT
CLIP RESOLUTION 360 11X235 (MISCELLANEOUS)
FCP ESCP3.2XJMB 240X2.8X (MISCELLANEOUS) ×2
FORCEPS BIOP RAD 4 LRG CAP 4 (CUTTING FORCEPS) IMPLANT
FORCEPS BIOP RJ4 240 W/NDL (MISCELLANEOUS) ×3
FORCEPS ESCP3.2XJMB 240X2.8X (MISCELLANEOUS) ×1 IMPLANT
GOWN CVR UNV OPN BCK APRN NK (MISCELLANEOUS) ×4 IMPLANT
GOWN ISOL THUMB LOOP REG UNIV (MISCELLANEOUS) ×6
INJECTOR VARIJECT VIN23 (MISCELLANEOUS) IMPLANT
KIT DEFENDO VALVE AND CONN (KITS) IMPLANT
KIT ENDO PROCEDURE OLY (KITS) ×3 IMPLANT
MARKER SPOT ENDO TATTOO 5ML (MISCELLANEOUS) IMPLANT
PAD GROUND ADULT SPLIT (MISCELLANEOUS) IMPLANT
PROBE APC STR FIRE (PROBE) IMPLANT
RETRIEVER NET ROTH 2.5X230 LF (MISCELLANEOUS) IMPLANT
SNARE SHORT THROW 13M SML OVAL (MISCELLANEOUS) IMPLANT
SNARE SHORT THROW 30M LRG OVAL (MISCELLANEOUS) IMPLANT
SNARE SNG USE RND 15MM (INSTRUMENTS) IMPLANT
SPOT EX ENDOSCOPIC TATTOO (MISCELLANEOUS)
TRAP ETRAP POLY (MISCELLANEOUS) IMPLANT
VARIJECT INJECTOR VIN23 (MISCELLANEOUS)
WATER STERILE IRR 250ML POUR (IV SOLUTION) ×3 IMPLANT

## 2016-06-02 NOTE — Anesthesia Postprocedure Evaluation (Signed)
Anesthesia Post Note  Patient: Lauren Johnston  Procedure(s) Performed: Procedure(s) (LRB): COLONOSCOPY WITH PROPOFOL (N/A) POLYPECTOMY  Patient location during evaluation: PACU Anesthesia Type: MAC Level of consciousness: awake and alert and oriented Pain management: satisfactory to patient Vital Signs Assessment: post-procedure vital signs reviewed and stable Respiratory status: spontaneous breathing, nonlabored ventilation and respiratory function stable Cardiovascular status: blood pressure returned to baseline and stable Postop Assessment: Adequate PO intake and No signs of nausea or vomiting Anesthetic complications: no    Raliegh Ip

## 2016-06-02 NOTE — Anesthesia Preprocedure Evaluation (Signed)
Anesthesia Evaluation  Patient identified by MRN, date of birth, ID band Patient awake    Reviewed: Allergy & Precautions, H&P , NPO status , Patient's Chart, lab work & pertinent test results  Airway Mallampati: II  TM Distance: >3 FB Neck ROM: full    Dental no notable dental hx.    Pulmonary    Pulmonary exam normal        Cardiovascular Normal cardiovascular exam     Neuro/Psych    GI/Hepatic   Endo/Other  Hypothyroidism   Renal/GU      Musculoskeletal   Abdominal   Peds  Hematology   Anesthesia Other Findings   Reproductive/Obstetrics                             Anesthesia Physical Anesthesia Plan  ASA: II  Anesthesia Plan: MAC   Post-op Pain Management:    Induction:   Airway Management Planned:   Additional Equipment:   Intra-op Plan:   Post-operative Plan:   Informed Consent: I have reviewed the patients History and Physical, chart, labs and discussed the procedure including the risks, benefits and alternatives for the proposed anesthesia with the patient or authorized representative who has indicated his/her understanding and acceptance.     Plan Discussed with:   Anesthesia Plan Comments:         Anesthesia Quick Evaluation

## 2016-06-02 NOTE — Transfer of Care (Signed)
Immediate Anesthesia Transfer of Care Note  Patient: Lauren Johnston  Procedure(s) Performed: Procedure(s): COLONOSCOPY WITH PROPOFOL (N/A) POLYPECTOMY  Patient Location: PACU  Anesthesia Type: MAC  Level of Consciousness: awake, alert  and patient cooperative  Airway and Oxygen Therapy: Patient Spontanous Breathing and Patient connected to supplemental oxygen  Post-op Assessment: Post-op Vital signs reviewed, Patient's Cardiovascular Status Stable, Respiratory Function Stable, Patent Airway and No signs of Nausea or vomiting  Post-op Vital Signs: Reviewed and stable  Complications: No apparent anesthesia complications

## 2016-06-02 NOTE — H&P (Signed)
Lucilla Lame, MD [facility] [address], [place], [place] [address] Phone: [phone] Fax : [phone]  Primary Care Physician:  Ashok Norris, MD Primary Gastroenterologist:  Dr. Allen Norris  Pre-Procedure History & Physical: HPI:  Lauren Johnston is a 65 y.o. female is here for a screening colonoscopy.   Past Medical History:  Diagnosis Date  . Arthritis    hips  . Dental crowns present    dental implants - upper  . Hyperlipidemia   . Hypothyroidism   . Thyroid disease     Past Surgical History:  Procedure Laterality Date  . ABDOMINAL HYSTERECTOMY    . BILATERAL OOPHORECTOMY    . dequavian tendonitis    . KNEE ARTHROSCOPY      Prior to Admission medications   Medication Sig Start Date End Date Taking? Authorizing Provider  aspirin 81 MG tablet Take 81 mg by mouth daily.   Yes Historical Provider, MD  atorvastatin (LIPITOR) 20 MG tablet Take 1 tablet (20 mg total) by mouth daily. 05/07/16  Yes Steele Sizer, MD  Cholecalciferol (VITAMIN D) 2000 units CAPS Take by mouth.   Yes Historical Provider, MD  DULoxetine (CYMBALTA) 60 MG capsule Take 1 capsule (60 mg total) by mouth daily. 05/07/16  Yes Steele Sizer, MD  estradiol (ESTRACE) 0.5 MG tablet Take 1 tablet (0.5 mg total) by mouth daily. 01/16/16  Yes Steele Sizer, MD  levothyroxine (SYNTHROID, LEVOTHROID) 50 MCG tablet Take 1 tablet (50 mcg total) by mouth daily before breakfast. 02/05/16  Yes Steele Sizer, MD  zolpidem (AMBIEN CR) 12.5 MG CR tablet Take 1 tablet (12.5 mg total) by mouth at bedtime as needed for sleep. 05/07/16  Yes Steele Sizer, MD  gabapentin (NEURONTIN) 300 MG capsule Take 1 capsule (300 mg total) by mouth 3 (three) times daily. Patient not taking: Reported on 05/26/2016 08/29/15   Ashok Norris, MD    Allergies as of 05/12/2016 - Review Complete 05/12/2016  Allergen Reaction Noted  . Sulfamethoxazole-trimethoprim Other (See Comments) and Rash 04/23/2015  . Citalopram  02/05/2016  .  Tetracycline Other (See Comments) 04/23/2015  . Tetracyclines & related Itching 09/20/2013  . Codeine Itching 09/20/2013    Family History  Problem Relation Age of Onset  . Diabetes Mother   . Emphysema Father   . Multiple myeloma Brother     Social History   Social History  . Marital status: Widowed    Spouse name: N/A  . Number of children: N/A  . Years of education: N/A   Occupational History  . Not on file.   Social History Main Topics  . Smoking status: Never Smoker  . Smokeless tobacco: Never Used  . Alcohol use No  . Drug use: No  . Sexual activity: Yes    Partners: Male    Birth control/ protection: Post-menopausal   Other Topics Concern  . Not on file   Social History Narrative  . No narrative on file    Review of Systems: See HPI, otherwise negative ROS  Physical Exam: BP (!) 136/93   Pulse 80   Temp 97.1 F (36.2 C)   Resp 16   Ht 5' 7" (1.702 m)   Wt 195 lb (88.5 kg)   SpO2 100%   BMI 30.54 kg/m  General:   Alert,  pleasant and cooperative in NAD Head:  Normocephalic and atraumatic. Neck:  Supple; no masses or thyromegaly. Lungs:  Clear throughout to auscultation.    Heart:  Regular rate and rhythm. Abdomen:  Soft,  nontender and nondistended. Normal bowel sounds, without guarding, and without rebound.   Neurologic:  Alert and  oriented x4;  grossly normal neurologically.  Impression/Plan: Lauren Johnston is now here to undergo a screening colonoscopy.  Risks, benefits, and alternatives regarding colonoscopy have been reviewed with the patient.  Questions have been answered.  All parties agreeable.

## 2016-06-02 NOTE — Op Note (Signed)
[facility] Gastroenterology Patient Name: Lauren Johnston Procedure Date: 06/02/2016 9:00 AM MRN: [id number] Account #: [id number] Date of Birth: 05-Mar-1951 Admit Type: Outpatient Age: 66 Room: [facility] OR ROOM 01 Gender: Female Note Status: Finalized Procedure:            Colonoscopy Indications:          Screening for colorectal malignant neoplasm Providers:            Lucilla Lame MD, MD Referring MD:         Ashok Norris, MD (Referring MD) Medicines:            Propofol per Anesthesia Complications:        No immediate complications. Procedure:            Pre-Anesthesia Assessment:                       - Prior to the procedure, a History and Physical was                        performed, and patient medications and allergies were                        reviewed. The patient's tolerance of previous                        anesthesia was also reviewed. The risks and benefits of                        the procedure and the sedation options and risks were                        discussed with the patient. All questions were                        answered, and informed consent was obtained. Prior                        Anticoagulants: The patient has taken no previous                        anticoagulant or antiplatelet agents. ASA Grade                        Assessment: II - A patient with mild systemic disease.                        After reviewing the risks and benefits, the patient was                        deemed in satisfactory condition to undergo the                        procedure.                       After obtaining informed consent, the colonoscope was                        passed under direct vision. Throughout the procedure,  the patient's blood pressure, pulse, and oxygen                        saturations were monitored continuously. The Olympus                        CF-HQ190L Colonoscope (S#. [id number]) was introduced                      through the anus and advanced to the the cecum,                        identified by appendiceal orifice and ileocecal valve.                        The colonoscopy was performed without difficulty. The                        patient tolerated the procedure well. The quality of                        the bowel preparation was excellent. Findings:      The perianal and digital rectal examinations were normal.      A 3 mm polyp was found in the rectum. The polyp was sessile. The polyp       was removed with a cold biopsy forceps. Resection and retrieval were       complete. Impression:           - One 3 mm polyp in the rectum, removed with a cold                        biopsy forceps. Resected and retrieved. Recommendation:       - Await pathology results.                       - Discharge patient to home.                       - Resume previous diet.                       - Continue present medications. Procedure Code(s):    --- Professional ---                       [phone], Colonoscopy, flexible; with biopsy, single or                        multiple Diagnosis Code(s):    --- Professional ---                       Z12.11, Encounter for screening for malignant neoplasm                        of colon                       K62.1, Rectal polyp CPT copyright 2016 American Medical Association. All rights reserved. The codes documented in this report are preliminary and upon coder review may  be revised to meet current compliance requirements. Lucilla Lame MD, MD 06/02/2016 9:19:06 AM This report  has been signed electronically. Number of Addenda: 0 Note Initiated On: 06/02/2016 9:00 AM Scope Withdrawal Time: 0 hours 6 minutes 37 seconds  Total Procedure Duration: 0 hours 9 minutes 52 seconds       [facility]

## 2016-06-03 ENCOUNTER — Encounter: Payer: Self-pay | Admitting: Gastroenterology

## 2016-06-03 DIAGNOSIS — M25552 Pain in left hip: Secondary | ICD-10-CM | POA: Diagnosis not present

## 2016-06-03 DIAGNOSIS — M25652 Stiffness of left hip, not elsewhere classified: Secondary | ICD-10-CM | POA: Diagnosis not present

## 2016-06-05 ENCOUNTER — Encounter: Payer: Self-pay | Admitting: Gastroenterology

## 2016-06-10 ENCOUNTER — Encounter: Payer: Self-pay | Admitting: Gastroenterology

## 2016-06-10 DIAGNOSIS — M25652 Stiffness of left hip, not elsewhere classified: Secondary | ICD-10-CM | POA: Diagnosis not present

## 2016-06-10 DIAGNOSIS — M25552 Pain in left hip: Secondary | ICD-10-CM | POA: Diagnosis not present

## 2016-06-13 DIAGNOSIS — M25552 Pain in left hip: Secondary | ICD-10-CM | POA: Diagnosis not present

## 2016-06-13 DIAGNOSIS — M25652 Stiffness of left hip, not elsewhere classified: Secondary | ICD-10-CM | POA: Diagnosis not present

## 2016-06-16 DIAGNOSIS — M1612 Unilateral primary osteoarthritis, left hip: Secondary | ICD-10-CM | POA: Diagnosis not present

## 2016-06-16 DIAGNOSIS — M25552 Pain in left hip: Secondary | ICD-10-CM | POA: Diagnosis not present

## 2016-06-24 DIAGNOSIS — M25652 Stiffness of left hip, not elsewhere classified: Secondary | ICD-10-CM | POA: Diagnosis not present

## 2016-06-24 DIAGNOSIS — M25552 Pain in left hip: Secondary | ICD-10-CM | POA: Diagnosis not present

## 2016-07-01 DIAGNOSIS — M25652 Stiffness of left hip, not elsewhere classified: Secondary | ICD-10-CM | POA: Diagnosis not present

## 2016-07-01 DIAGNOSIS — M25552 Pain in left hip: Secondary | ICD-10-CM | POA: Diagnosis not present

## 2016-07-08 DIAGNOSIS — M1711 Unilateral primary osteoarthritis, right knee: Secondary | ICD-10-CM | POA: Diagnosis not present

## 2016-07-09 ENCOUNTER — Ambulatory Visit (INDEPENDENT_AMBULATORY_CARE_PROVIDER_SITE_OTHER): Payer: Medicare Other | Admitting: Family Medicine

## 2016-07-09 ENCOUNTER — Encounter: Payer: Self-pay | Admitting: Family Medicine

## 2016-07-09 VITALS — BP 122/78 | HR 80 | Temp 98.0°F | Resp 16 | Ht 67.0 in | Wt 199.3 lb

## 2016-07-09 DIAGNOSIS — F5101 Primary insomnia: Secondary | ICD-10-CM

## 2016-07-09 DIAGNOSIS — R944 Abnormal results of kidney function studies: Secondary | ICD-10-CM | POA: Diagnosis not present

## 2016-07-09 DIAGNOSIS — Z01419 Encounter for gynecological examination (general) (routine) without abnormal findings: Secondary | ICD-10-CM

## 2016-07-09 DIAGNOSIS — E890 Postprocedural hypothyroidism: Secondary | ICD-10-CM

## 2016-07-09 DIAGNOSIS — Z124 Encounter for screening for malignant neoplasm of cervix: Secondary | ICD-10-CM

## 2016-07-09 DIAGNOSIS — E559 Vitamin D deficiency, unspecified: Secondary | ICD-10-CM

## 2016-07-09 DIAGNOSIS — Z0001 Encounter for general adult medical examination with abnormal findings: Secondary | ICD-10-CM | POA: Diagnosis not present

## 2016-07-09 LAB — COMPLETE METABOLIC PANEL WITH GFR
ALBUMIN: 4 g/dL (ref 3.6–5.1)
ALT: 13 U/L (ref 6–29)
AST: 17 U/L (ref 10–35)
Alkaline Phosphatase: 61 U/L (ref 33–130)
BILIRUBIN TOTAL: 0.5 mg/dL (ref 0.2–1.2)
BUN: 15 mg/dL (ref 7–25)
CALCIUM: 9.5 mg/dL (ref 8.6–10.4)
CO2: 27 mmol/L (ref 20–31)
CREATININE: 1.01 mg/dL — AB (ref 0.50–0.99)
Chloride: 102 mmol/L (ref 98–110)
GFR, EST AFRICAN AMERICAN: 68 mL/min (ref >=60)
GFR, Est Non African American: 59 mL/min — ABNORMAL LOW (ref >=60)
Glucose, Bld: 96 mg/dL (ref 65–99)
Potassium: 4.1 mmol/L (ref 3.5–5.3)
Sodium: 138 mmol/L (ref 135–146)
TOTAL PROTEIN: 7.4 g/dL (ref 6.1–8.1)

## 2016-07-09 LAB — TSH: TSH: 0.77 m[IU]/L

## 2016-07-09 MED ORDER — LEVOTHYROXINE SODIUM 50 MCG PO TABS: 50.0000 ug | ORAL_TABLET | Freq: Every day | ORAL | 1 refills | Status: DC

## 2016-07-09 MED ORDER — QUETIAPINE FUMARATE 25 MG PO TABS: 25.0000 mg | ORAL_TABLET | Freq: Every day | ORAL | 1 refills | Status: DC

## 2016-07-09 NOTE — Progress Notes (Signed)
Name: Lauren Johnston   MRN: [id number]    DOB: 1951/05/25   Date:07/09/2016       Progress Note  Subjective  Chief Complaint  Chief Complaint  Patient presents with  . Annual Exam    HPI  Well Woman : she is a widow for the past 4 years, she has been dating her old boyfriend ( from 53[employer] 27 [employer] grade ) and they re-connected in a class reunion last year. She has been sexually active but denies vaginal discharge or dyspareunia. She denies urinary problems  Hypothyroidism: she is taking medication, denies dysphagia, no weight gain or constipation  Vitamin D deficiency: she has been taking otc supplementation  Insomnia: we will try to stop Ambien CR and switch to Seroquel, discussed possible side effects. She states her mind is always going.  Patient Active Problem List   Diagnosis Date Noted  . Special screening for malignant neoplasms, colon   . Rectal polyp   . Right wrist tendonitis 05/16/2016  . Osteoarthritis of left hip 05/16/2016  . Vitamin D deficiency 02/05/2016  . Cold sore 02/05/2016  . Degeneration of intervertebral disc of cervical region 02/05/2016  . Insomnia 04/23/2015  . Major depression in remission ([place]) 04/23/2015  . Chronic pain 04/23/2015  . Goiter, nontoxic, 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  . 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 Uterine leiomyoma 06/16/2007    Past Surgical History:  Procedure Laterality Date  . ABDOMINAL HYSTERECTOMY    . BILATERAL OOPHORECTOMY    . COLONOSCOPY WITH PROPOFOL N/A 06/02/2016   Procedure: COLONOSCOPY WITH PROPOFOL;  Surgeon: Lucilla Lame, MD;  Location: [place];  Service: Endoscopy;  Laterality: N/A;  . dequavian tendonitis    . KNEE ARTHROSCOPY    . POLYPECTOMY  06/02/2016   Procedure: POLYPECTOMY;  Surgeon: Lucilla Lame, MD;  Location: [place];  Service: Endoscopy;;    Family History  Problem Relation Age of Onset  . Diabetes Mother   . Emphysema Father   . Multiple myeloma Brother      Social History   Social History  . Marital status: Widowed    Spouse name: N/A  . Number of children: N/A  . Years of education: N/A   Occupational History  . Not on file.   Social History Main Topics  . Smoking status: Never Smoker  . Smokeless tobacco: Never Used  . Alcohol use No  . Drug use: No  . Sexual activity: Yes    Partners: Male    Birth control/ protection: Post-menopausal   Other Topics Concern  . Not on file   Social History Narrative  . No narrative on file     Current Outpatient Prescriptions:  .  aspirin 81 MG tablet, Take 81 mg by mouth daily., Disp: , Rfl:  .  atorvastatin (LIPITOR) 20 MG tablet, Take 1 tablet (20 mg total) by mouth daily., Disp: 90 tablet, Rfl: 1 .  Cholecalciferol (VITAMIN D) 2000 units CAPS, Take by mouth., Disp: , Rfl:  .  DULoxetine (CYMBALTA) 60 MG capsule, Take 1 capsule (60 mg total) by mouth daily., Disp: 90 capsule, Rfl: 1 .  estradiol (ESTRACE) 0.5 MG tablet, Take 1 tablet (0.5 mg total) by mouth daily., Disp: 90 tablet, Rfl: 1 .  gabapentin (NEURONTIN) 300 MG capsule, Take 1 capsule (300 mg total) by mouth 3 (three) times daily. (Patient not taking: Reported on 05/26/2016), Disp: 270 capsule, Rfl: 1 .  levothyroxine (SYNTHROID, LEVOTHROID) 50 MCG tablet, Take 1 tablet (50 mcg total)  by mouth daily before breakfast., Disp: 90 tablet, Rfl: 1 .  zolpidem (AMBIEN CR) 12.5 MG CR tablet, Take 1 tablet (12.5 mg total) by mouth at bedtime as needed for sleep., Disp: 90 tablet, Rfl: 0  Allergies  Allergen Reactions  . Sulfamethoxazole-Trimethoprim Other (See Comments) and Rash  . Citalopram   . Tetracycline Other (See Comments)    Other Reaction: URTICARIA  . Tetracyclines & Related Itching  . Codeine Itching     ROS  Constitutional: Negative for fever or weight change.  Respiratory: Negative for cough and shortness of breath.   Cardiovascular: Negative for chest pain or palpitations.  Gastrointestinal: Negative for  abdominal pain, no bowel changes.  Musculoskeletal: Negative for gait problem or joint swelling.  Skin: Negative for rash.  Neurological: Negative for dizziness or headache.  No other specific complaints in a complete review of systems (except as listed in HPI above).  Objective  Vitals:   07/09/16 1122  BP: 122/78  Pulse: 80  Resp: 16  Temp: 98 F (36.7 C)  SpO2: 95%  Weight: 199 lb 5 oz (90.4 kg)  Height: '5\' 7"'  (1.702 m)    Body mass index is 31.22 kg/m.  Physical Exam  Constitutional: Patient appears well-developed and obese. No distress.  HENT: Head: Normocephalic and atraumatic. Ears: B TMs ok, no erythema or effusion; Nose: Nose normal. Mouth/Throat: Oropharynx is clear and moist. No oropharyngeal exudate.  Eyes: Conjunctivae and EOM are normal. Pupils are equal, round, and reactive to light. No scleral icterus.  Neck: Normal range of motion. Neck supple. No JVD present. No thyromegaly present.  Cardiovascular: Normal rate, regular rhythm and normal heart sounds.  No murmur heard. No BLE edema. Pulmonary/Chest: Effort normal and breath sounds normal. No respiratory distress. Abdominal: Soft. Bowel sounds are normal, no distension. There is no tenderness. no masses Breast: no lumps or masses, no nipple discharge or rashes FEMALE GENITALIA:  External genitalia normal External urethra normal Vaginal vault normal without discharge or lesions Cervix absent  Bimanual exam normal without masses RECTAL: not done Musculoskeletal: Normal range of motion, no joint effusions. No gross deformities Neurological: he is alert and oriented to person, place, and time. No cranial nerve deficit. Coordination, balance, strength, speech and gait are normal.  Skin: Skin is warm and dry. No rash noted. No erythema.  Psychiatric: Patient has a normal mood and affect. behavior is normal. Judgment and thought content normal.  PHQ2/9: Depression screen [facility] 2/9 07/09/2016 05/07/2016 02/05/2016  08/29/2015 04/23/2015  Decreased Interest 0 0 0 0 0  Down, Depressed, Hopeless 0 0 0 0 0  PHQ - 2 Score 0 0 0 0 0     Fall Risk: Fall Risk  07/09/2016 05/07/2016 02/05/2016 08/29/2015 04/23/2015  Falls in the past year? No No No No No     Functional Status Survey: Is the patient deaf or have difficulty hearing?: No Does the patient have difficulty seeing, even when wearing glasses/contacts?: No Does the patient have difficulty concentrating, remembering, or making decisions?: No Does the patient have difficulty walking or climbing stairs?: Yes Does the patient have difficulty dressing or bathing?: No Does the patient have difficulty doing errands alone such as visiting a doctor's office or shopping?: No    Assessment & Plan  1. Well woman exam  Discussed importance of 150 minutes of physical activity weekly, eat two servings of fish weekly, eat one serving of tree nuts ( cashews, pistachios, pecans, almonds.Marland Kitchen) every other day, eat 6 servings of fruit/vegetables  daily and drink plenty of water and avoid sweet beverages.   2. Cervical cancer screening   not done, s/p hysterectomy   3. Hypothyroidism, postablative  - levothyroxine (SYNTHROID, LEVOTHROID) 50 MCG tablet; Take 1 tablet (50 mcg total) by mouth daily before breakfast.  Dispense: 90 tablet; Refill: 1  4. Primary insomnia  - QUEtiapine (SEROQUEL) 25 MG tablet; Take 1 tablet (25 mg total) by mouth at bedtime.  Dispense: 90 tablet; Refill: 1  5. Vitamin D deficiency  - TSH  6. Decreased GFR  - COMPLETE METABOLIC PANEL WITH GFR

## 2016-07-10 LAB — VITAMIN D 25 HYDROXY (VIT D DEFICIENCY, FRACTURES): Vit D, 25-Hydroxy: 33 ng/mL (ref 30–100)

## 2016-07-16 ENCOUNTER — Ambulatory Visit
Admission: RE | Admit: 2016-07-16 | Discharge: 2016-07-16 | Disposition: A | Discharge status: Home / Self Care | Payer: Medicare Other | Source: Ambulatory Visit | Attending: Family Medicine | Admitting: Family Medicine

## 2016-07-16 DIAGNOSIS — Z1231 Encounter for screening mammogram for malignant neoplasm of breast: Secondary | ICD-10-CM

## 2016-07-22 DIAGNOSIS — M1612 Unilateral primary osteoarthritis, left hip: Secondary | ICD-10-CM | POA: Diagnosis not present

## 2016-07-22 DIAGNOSIS — M25552 Pain in left hip: Secondary | ICD-10-CM | POA: Diagnosis not present

## 2016-08-06 DIAGNOSIS — M1711 Unilateral primary osteoarthritis, right knee: Secondary | ICD-10-CM | POA: Diagnosis not present

## 2016-08-06 DIAGNOSIS — M25561 Pain in right knee: Secondary | ICD-10-CM | POA: Diagnosis not present

## 2016-08-06 DIAGNOSIS — R609 Edema, unspecified: Secondary | ICD-10-CM | POA: Diagnosis not present

## 2016-08-12 DIAGNOSIS — M25561 Pain in right knee: Secondary | ICD-10-CM | POA: Diagnosis not present

## 2016-08-12 DIAGNOSIS — R609 Edema, unspecified: Secondary | ICD-10-CM | POA: Diagnosis not present

## 2016-08-12 DIAGNOSIS — M25661 Stiffness of right knee, not elsewhere classified: Secondary | ICD-10-CM | POA: Diagnosis not present

## 2016-08-14 ENCOUNTER — Other Ambulatory Visit: Payer: Self-pay | Admitting: Family Medicine

## 2016-08-14 ENCOUNTER — Telehealth: Payer: Self-pay | Admitting: Family Medicine

## 2016-08-14 MED ORDER — SUVOREXANT 15 MG PO TABS: 1.0000 | ORAL_TABLET | Freq: Every evening | ORAL | 0 refills | Status: DC

## 2016-08-14 NOTE — Telephone Encounter (Signed)
Patient was put on quetiapine. States she is having side effects from it. It makes her feel jittery, dizzy, feeling like she is having out of body experiences, and makes her feel like she is hallucinating. Patient is asking that you please put her back on zolpidem. Asking that you send prescription to [facility] and that she can bring quetiapine prescription to you if you need her too.

## 2016-08-14 NOTE — Telephone Encounter (Signed)
I will try switching to Belsomra, we should try to avoid giving her Ambien

## 2016-09-03 ENCOUNTER — Ambulatory Visit: Payer: Medicare Other

## 2016-09-25 ENCOUNTER — Telehealth: Payer: Self-pay | Admitting: Family Medicine

## 2016-09-25 NOTE — Telephone Encounter (Signed)
It is okay to call it in

## 2016-09-25 NOTE — Telephone Encounter (Signed)
It was not sent electronically, script was printed on 08/14/16. Are you willing to reprint?

## 2016-09-25 NOTE — Telephone Encounter (Signed)
Patient stated that the [facility] can not take the belsomra rx electronically, therefore she needs it to be faxed to the [facility] instead.

## 2016-09-26 ENCOUNTER — Other Ambulatory Visit: Payer: Self-pay | Admitting: Family Medicine

## 2016-09-26 MED ORDER — SUVOREXANT 15 MG PO TABS: 1.0000 | ORAL_TABLET | Freq: Every evening | ORAL | 0 refills | Status: DC

## 2016-09-26 NOTE — Telephone Encounter (Signed)
faxed

## 2016-09-26 NOTE — Telephone Encounter (Signed)
printed

## 2016-09-26 NOTE — Telephone Encounter (Signed)
Pharmacy said that it has to be faxed

## 2016-10-09 ENCOUNTER — Ambulatory Visit
Admission: RE | Admit: 2016-10-09 | Discharge: 2016-10-09 | Disposition: A | Discharge status: Home / Self Care | Payer: Medicare Other | Source: Ambulatory Visit | Attending: Family Medicine | Admitting: Family Medicine

## 2016-10-09 ENCOUNTER — Encounter: Payer: Self-pay | Admitting: Family Medicine

## 2016-10-09 DIAGNOSIS — E2839 Other primary ovarian failure: Secondary | ICD-10-CM | POA: Diagnosis not present

## 2016-10-09 DIAGNOSIS — M8580 Other specified disorders of bone density and structure, unspecified site: Secondary | ICD-10-CM | POA: Insufficient documentation

## 2016-10-09 DIAGNOSIS — M85852 Other specified disorders of bone density and structure, left thigh: Secondary | ICD-10-CM | POA: Insufficient documentation

## 2016-10-10 ENCOUNTER — Telehealth: Payer: Self-pay

## 2016-10-10 ENCOUNTER — Other Ambulatory Visit: Payer: Self-pay | Admitting: Family Medicine

## 2016-10-10 MED ORDER — TRAZODONE HCL 50 MG PO TABS: 25.0000 mg | ORAL_TABLET | Freq: Every evening | ORAL | 0 refills | Status: DC | PRN

## 2016-10-10 NOTE — Telephone Encounter (Signed)
Patient notified via voicemail.

## 2016-10-10 NOTE — Telephone Encounter (Signed)
Sent Trazodone to [facility] and if she can sleep we can send it to [facility], She can go up to 100 mg at night if needed.  She will need to call the pharmacy to cancel the Seroquel

## 2016-10-10 NOTE — Telephone Encounter (Signed)
Patient states Quetiapine made her hallucinations, feel high, dry mouth and could not tolerate this sleep medication. Asked [place] to Discontinue this medication because the pharmacy keeps filling it and she doesn't like it. Patient states Ambien worked well for her and she has been unable to sleep. Patient also called [facility] and they can not fill Belsomra due to not covering it. But they do cover Lunesta, Ambien and Trazodone. Please fill one of the prefer medications to [facility] and send in a 30 day supply to local pharmacy. [facility] takes 14-21 days to process medications and patient has nothing but the quetiapine. Thanks

## 2016-10-27 ENCOUNTER — Other Ambulatory Visit: Payer: Self-pay

## 2016-10-27 MED ORDER — TRAZODONE HCL 50 MG PO TABS: 25.0000 mg | ORAL_TABLET | Freq: Every evening | ORAL | 0 refills | Status: DC | PRN

## 2016-10-27 NOTE — Telephone Encounter (Signed)
Patient called and states Trazodone has been helping her sleep, she has been sleeping 6 hours nightly with medication. Please send medication to [facility] due to medication being successful. Will need a 90 day supply of medication.

## 2016-11-11 ENCOUNTER — Ambulatory Visit (INDEPENDENT_AMBULATORY_CARE_PROVIDER_SITE_OTHER): Payer: Medicare Other | Admitting: Family Medicine

## 2016-11-11 ENCOUNTER — Encounter: Payer: Self-pay | Admitting: Family Medicine

## 2016-11-11 VITALS — BP 118/82 | HR 90 | Temp 97.4°F | Resp 14 | Wt 203.9 lb

## 2016-11-11 DIAGNOSIS — E66811 Obesity, class 1: Secondary | ICD-10-CM

## 2016-11-11 DIAGNOSIS — E669 Obesity, unspecified: Secondary | ICD-10-CM | POA: Insufficient documentation

## 2016-11-11 DIAGNOSIS — E559 Vitamin D deficiency, unspecified: Secondary | ICD-10-CM

## 2016-11-11 DIAGNOSIS — E890 Postprocedural hypothyroidism: Secondary | ICD-10-CM | POA: Diagnosis not present

## 2016-11-11 DIAGNOSIS — F5101 Primary insomnia: Secondary | ICD-10-CM

## 2016-11-11 DIAGNOSIS — M159 Polyosteoarthritis, unspecified: Secondary | ICD-10-CM

## 2016-11-11 DIAGNOSIS — M75101 Unspecified rotator cuff tear or rupture of right shoulder, not specified as traumatic: Secondary | ICD-10-CM

## 2016-11-11 DIAGNOSIS — Z780 Asymptomatic menopausal state: Secondary | ICD-10-CM

## 2016-11-11 DIAGNOSIS — F331 Major depressive disorder, recurrent, moderate: Secondary | ICD-10-CM | POA: Diagnosis not present

## 2016-11-11 DIAGNOSIS — M8949 Other hypertrophic osteoarthropathy, multiple sites: Secondary | ICD-10-CM

## 2016-11-11 DIAGNOSIS — E7800 Pure hypercholesterolemia, unspecified: Secondary | ICD-10-CM

## 2016-11-11 DIAGNOSIS — E6609 Other obesity due to excess calories: Secondary | ICD-10-CM

## 2016-11-11 DIAGNOSIS — M150 Primary generalized (osteo)arthritis: Secondary | ICD-10-CM

## 2016-11-11 MED ORDER — ESTRADIOL 0.5 MG PO TABS: 0.5000 mg | ORAL_TABLET | Freq: Every day | ORAL | 1 refills | Status: DC

## 2016-11-11 MED ORDER — DULOXETINE HCL 60 MG PO CPEP: 60.0000 mg | ORAL_CAPSULE | Freq: Every day | ORAL | 1 refills | Status: DC

## 2016-11-11 MED ORDER — LEVOTHYROXINE SODIUM 50 MCG PO TABS: 50.0000 ug | ORAL_TABLET | Freq: Every day | ORAL | 1 refills | Status: DC

## 2016-11-11 MED ORDER — ATORVASTATIN CALCIUM 20 MG PO TABS: 20.0000 mg | ORAL_TABLET | Freq: Every day | ORAL | 1 refills | Status: DC

## 2016-11-11 MED ORDER — MELOXICAM 15 MG PO TABS: 15.0000 mg | ORAL_TABLET | Freq: Every day | ORAL | 0 refills | Status: DC

## 2016-11-11 NOTE — Progress Notes (Addendum)
Name: Lauren Johnston   MRN: [id number]    DOB: 02/17/1951   Date:11/11/2016       Progress Note  Subjective  Chief Complaint  Chief Complaint  Patient presents with  . Insomnia    4 month follow up    HPI  Hypothyroidism: she is taking medication, denies dysphagia, no weight gain or constipation, last TSH is has been at goal for the past year.   Vitamin D deficiency: she has been taking otc supplementation   Insomnia: we stopped Ambien Summer 2017, but unable to tolerate side effects of Seroquel, but doing well on Trazodone 25 mg qhs and is able to fall and stay asleep.   OA: she has been using elliptical and has helped with strengthening her quads. Occasionally has pain, but doing better with exercising, no swelling or redness  Depression Major recurrent: she was very depressed when husband died from complications of CHF in 3903, she was very angry because he did not tell her how sick he was and it caught her by surprise. Mother died end of February 25, 2016 and was feeling lost initially, still grieving , she is getting counseling through her church. She states Cymbalta has helped with symptoms but she does not want to stop medication at this time  Right shoulder pain : she states been sweeping her yard and developed pain on right biceps about one week ago, followed by right shoulder pain, aching like, pain with abduction of right arm, after that pain went under her right breast and is worried that something is wrong with her breast, no redness not lumps, but would like to be checked.   Obesity: she is going to try cutting down on ice cream, she eats when stressed out.   Patient Active Problem List   Diagnosis Date Noted  . Osteopenia 10/09/2016  . Special screening for malignant neoplasms, colon   . Rectal polyp   . Right wrist tendonitis 05/16/2016  . Osteoarthritis of left hip 05/16/2016  . Vitamin D deficiency 02/05/2016  . Cold sore 02/05/2016  . Degeneration of intervertebral  disc of cervical region 02/05/2016  . Insomnia 04/23/2015  . Major depression in remission ([place]) 04/23/2015  . Chronic pain 04/23/2015  . Goiter, nontoxic, 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  . 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 Uterine leiomyoma 06/16/2007    Past Surgical History:  Procedure Laterality Date  . ABDOMINAL HYSTERECTOMY    . BILATERAL OOPHORECTOMY    . COLONOSCOPY WITH PROPOFOL N/A 06/02/2016   Procedure: COLONOSCOPY WITH PROPOFOL;  Surgeon: Lucilla Lame, MD;  Location: [place];  Service: Endoscopy;  Laterality: N/A;  . dequavian tendonitis    . KNEE ARTHROSCOPY    . POLYPECTOMY  06/02/2016   Procedure: POLYPECTOMY;  Surgeon: Lucilla Lame, MD;  Location: [place];  Service: Endoscopy;;    Family History  Problem Relation Age of Onset  . Diabetes Mother   . Emphysema Father   . Multiple myeloma Brother     Social History   Social History  . Marital status: Widowed    Spouse name: N/A  . Number of children: N/A  . Years of education: N/A   Occupational History  . Not on file.   Social History Main Topics  . Smoking status: Never Smoker  . Smokeless tobacco: Never Used  . Alcohol use No  . Drug use: No  . Sexual activity: Yes    Partners: Male    Birth control/ protection: Post-menopausal   Other Topics Concern  .  Not on file   Social History Narrative  . No narrative on file     Current Outpatient Prescriptions:  .  Calcium Carb-Cholecalciferol (CALCIUM 1000 + D PO), Take by mouth., Disp: , Rfl:  .  aspirin 81 MG tablet, Take 81 mg by mouth daily., Disp: , Rfl:  .  atorvastatin (LIPITOR) 20 MG tablet, Take 1 tablet (20 mg total) by mouth daily., Disp: 90 tablet, Rfl: 1 .  DULoxetine (CYMBALTA) 60 MG capsule, Take 1 capsule (60 mg total) by mouth daily., Disp: 90 capsule, Rfl: 1 .  estradiol (ESTRACE) 0.5 MG tablet, Take 1 tablet (0.5 mg total) by mouth daily., Disp: 90 tablet, Rfl: 1 .  levothyroxine (SYNTHROID, LEVOTHROID) 50  MCG tablet, Take 1 tablet (50 mcg total) by mouth daily before breakfast., Disp: 90 tablet, Rfl: 1 .  traZODone (DESYREL) 50 MG tablet, Take 0.5-1 tablets (25-50 mg total) by mouth at bedtime as needed for sleep., Disp: 90 tablet, Rfl: 0  Allergies  Allergen Reactions  . Sulfamethoxazole-Trimethoprim Other (See Comments) and Rash  . Citalopram   . Tetracycline Other (See Comments)    Other Reaction: URTICARIA  . Tetracyclines & Related Itching  . Codeine Itching     ROS  Constitutional: Negative for fever or weight change.  Respiratory: Negative for cough and shortness of breath.   Cardiovascular: Negative for chest pain or palpitations.  Gastrointestinal: Negative for abdominal pain, no bowel changes.  Musculoskeletal: Negative for gait problem or joint swelling.  Skin: Negative for rash.  Neurological: Negative for dizziness or headache.  No other specific complaints in a complete review of systems (except as listed in HPI above).  Objective  Vitals:   11/11/16 0958  BP: 118/82  Pulse: 90  Resp: 14  Temp: 97.4 F (36.3 C)  TempSrc: Oral  SpO2: 97%  Weight: 203 lb 14.4 oz (92.5 kg)    Body mass index is 31.94 kg/m.  Physical Exam  Constitutional: Patient appears well-developed and well-nourished. Obese. No distress.  HEENT: head atraumatic, normocephalic, pupils equal and reactive to light, neck supple, throat within normal limits Cardiovascular: Normal rate, regular rhythm and normal heart sounds.  No murmur heard. No BLE edema. Pulmonary/Chest: Effort normal and breath sounds normal. No respiratory distress. Abdominal: Soft.  There is no tenderness. Breast: pain under breast, likely muscular, no nipple discharge or lumps.  Psychiatric: Patient has a normal mood and affect. behavior is normal. Judgment and thought content normal. Muscular Skeletal: grinding with extension of both knees, pain during abduction of right shoulder and internal rotation    PHQ2/9: Depression screen [facility] 2/9 11/11/2016 07/09/2016 05/07/2016 02/05/2016 08/29/2015  Decreased Interest 0 0 0 0 0  Down, Depressed, Hopeless 0 0 0 0 0  PHQ - 2 Score 0 0 0 0 0     Fall Risk: Fall Risk  11/11/2016 07/09/2016 05/07/2016 02/05/2016 08/29/2015  Falls in the past year? No No No No No     Functional Status Survey: Is the patient deaf or have difficulty hearing?: No Does the patient have difficulty seeing, even when wearing glasses/contacts?: Yes Does the patient have difficulty concentrating, remembering, or making decisions?: No Does the patient have difficulty walking or climbing stairs?: No Does the patient have difficulty dressing or bathing?: No Does the patient have difficulty doing errands alone such as visiting a doctor's office or shopping?: No    Assessment & Plan  1. Hypothyroidism, postablative  - levothyroxine (SYNTHROID, LEVOTHROID) 50 MCG tablet; Take 1 tablet (50  mcg total) by mouth daily before breakfast.  Dispense: 90 tablet; Refill: 1  2. Primary insomnia  Doing well on Trazodone, off Ambien   3. Vitamin D deficiency  Continue otc supplementation   4. Moderate recurrent major depression ([place])  Still grieving the loss of her mother - DULoxetine (CYMBALTA) 60 MG capsule; Take 1 capsule (60 mg total) by mouth daily.  Dispense: 90 capsule; Refill: 1  5. Primary osteoarthritis involving multiple joints  Take Tylenol prn   6. Pure hypercholesterolemia  - atorvastatin (LIPITOR) 20 MG tablet; Take 1 tablet (20 mg total) by mouth daily.  Dispense: 90 tablet; Refill: 1  7. Menopause  She is taking half pill 4 days a week and one pill three days week.  - estradiol (ESTRACE) 0.5 MG tablet; Take 1 tablet (0.5 mg total) by mouth daily.  Dispense: 90 tablet; Refill: 1  8. Rotator cuff syndrome of right shoulder  - meloxicam (MOBIC) 15 MG tablet; Take 1 tablet (15 mg total) by mouth daily.  Dispense: 30 tablet; Refill: 0   9. Class 1 obesity due  to excess calories without serious comorbidity in adult, unspecified BMI  Discussed with the patient the risk posed by an increased BMI. Discussed importance of portion control, calorie counting and at least 150 minutes of physical activity weekly. Avoid sweet beverages and drink more water. Eat at least 6 servings of fruit and vegetables daily

## 2016-11-11 NOTE — Patient Instructions (Signed)
Rotator Cuff Tear Rehab After Surgery Ask your health care provider which exercises are safe for you. Do exercises exactly as told by your health care provider and adjust them as directed. It is normal to feel mild stretching, pulling, tightness, or discomfort as you do these exercises, but you should stop right away if you feel sudden pain or your pain gets worse. Do not begin these exercises until told by your health care provider. Stretching and range of motion exercises These exercises warm up your muscles and joints and improve the movement and flexibility of your shoulder. These exercises also help to relieve pain, numbness, and tingling. Exercise A: Pendulum 1. Stand near a wall or a surface that you can hold onto for balance. 2. Bend at the waist and let your left / right arm hang straight down. Use your other arm to keep your balance. 3. Relax your arm and shoulder muscles, and move your hips and your trunk so your left / right arm swings freely. Your arm should swing because of the motion of your body, not because you are using your arm or shoulder muscles. 4. Keep moving so your arm swings in the following directions, as told by your health care provider:  Side to side.  Forward and backward.  In clockwise and counterclockwise circles. Repeat __________ times, or for __________ seconds per direction. Complete this exercise __________ times a day. Exercise B: Flexion, seated 1. Sit in a stable chair so your left / right forearm can rest on a flat surface. Your elbow should rest at a height that keeps your upper arm next to your body. 2. Keeping your shoulder relaxed, lean forward at the waist and let your hand slide forward. Stop when you feel a stretch in your shoulder, or when you reach the angle that is recommended by your health care provider. 3. Hold for __________ seconds. 4. Slowly return to the starting position. Repeat __________ times. Complete this exercise __________ times  a day. Exercise C: Flexion, standing 1. Stand and hold a broomstick, a cane, or a similar object. Place your hands a little more than shoulder-width apart on the object. Your left / right hand should be palm-up, and your other hand should be palm-down. 2. Push the stick down with your healthy arm to raise your left / right arm in front of your body, and then over your head. Use your other hand to help move the stick. Stop when you feel a stretch in your shoulder, or when you reach the angle that is recommended by your health care provider.  Avoid shrugging your shoulder while you raise your arm. Keep your shoulder blade tucked down toward your spine.  Keep your left / right shoulder muscles relaxed. 3. Hold for __________ seconds. 4. Slowly return to the starting position. Repeat __________ times. Complete this exercise __________ times a day. Exercise D: Abduction, supine 1. Lie on your back and hold a broomstick, a cane, or a similar object. Place your hands a little more than shoulder-width apart on the object. Your left / right hand should be palm-up, and your other hand should be palm-down. 2. Push the stick to raise your left / right arm out to your side and then over your head. Use your other hand to help move the stick. Stop when you feel a stretch in your shoulder, or when you reach the angle that is recommended by your health care provider.  Avoid shrugging your shoulder while you raise your arm.   Keep your shoulder blade tucked down toward your spine. 3. Hold for __________ seconds. 4. Slowly return to the starting position. Repeat __________ times. Complete this exercise __________ times a day. Exercise E: Shoulder flexion, active-assisted 1. Lie on your back. You may bend your knees for comfort. 2. Hold a broomstick, a cane, or a similar object so your hands are about shoulder-width apart. Your palms should face toward your feet. 3. Raise your left / right arm over your head and  behind your head, toward the floor. Use your other hand to help you do this. Stop when you feel a gentle stretch in your shoulder, or when you reach the angle that is recommended by your health care provider. 4. Hold for __________ seconds. 5. Use the broomstick and your other arm to help you return your left / right arm to the starting position. Repeat __________ times. Complete this exercise __________ times a day. Exercise F: External rotation 1. Sit in a stable chair without armrests, or stand. 2. Tuck a soft object, such as a folded towel or a small ball, under your left / right upper arm. 3. Hold a broomstick, a cane, or a similar object so your palms face down, toward the floor. Bend your elbows to an "L" shape (90 degrees), and keep your hands about shoulder-width apart. 4. Straighten your healthy arm and push the broomstick across your body, toward your left / right side. Keep your left / right arm bent. This will rotate your left / right forearm away from your body. 5. Hold for __________ seconds. 6. Slowly return to the starting position. Repeat __________ times. Complete this exercise __________ times a day. Strengthening exercises These exercises build strength and endurance in your shoulder. Endurance is the ability to use your muscles for a long time, even after they get tired. Exercise G: Shoulder flexion, isometric 1. Stand or sit about 4-6 inches (10-15 cm) away from a wall with your left / right side facing the wall. 2. Gently make a fist and place your left / right hand on the wall so the top of your fist touches the wall. 3. With your left / right elbow straight, gently press the top of your fist into the wall. Gradually increase the pressure until you are pressing as hard as you can without shrugging your shoulder. 4. Hold for __________ seconds. 5. Slowly release the tension and relax your muscles completely before you repeat the exercise. Repeat __________ times. Complete  this exercise __________ times a day. Exercise H: Shoulder abduction, isometric 1. Stand or sit about 4-6 inches (10-15 cm) away from a wall with your right/left side facing the wall. 2. Bend your left / right elbow and gently press your elbow into the wall as if you are trying to move your arm out to your side. Increase the pressure gradually until you are pressing as hard as you can without shrugging your shoulder. 3. Hold for __________ seconds. 4. Slowly release the tension and relax your muscles completely before repeating the exercise. Repeat __________ times. Complete this exercise __________ times a day. Exercise I: Internal rotation, isometric 1. Stand or sit in a doorway, facing the door frame. 2. Bend your left / right elbow and place the palm of your hand against the door frame. Only your palm should be touching the frame. Keep your upper arm at your side. 3. Gently press your hand into the door frame, as if you are trying to push your arm toward your   abdomen. Do not let your wrist bend.  Avoid shrugging your shoulder while you press your hand into the door frame. Keep your shoulder blade tucked down toward the middle of your back. 4. Hold for __________ seconds. 5. Slowly release the tension, and relax your muscles completely before you repeat the exercise. Repeat __________ times. Complete this exercise __________ times a day. Exercise J: External rotation, isometric 1. Stand or sit in a doorway, facing the door frame. 2. Bend your left / right elbow and place the back of your wrist against the door frame. Only the back of your wrist should be touching the frame. Keep your upper arm at your side. 3. Gently press your wrist against the door frame, as if you are trying to push your arm away from your abdomen.  Avoid shrugging your shoulder while you press your wrist into the door frame. Keep your shoulder blade tucked down toward the middle of your back. 4. Hold for __________  seconds. 5. Slowly release the tension, and relax your muscles completely before you repeat the exercise. Repeat __________ times. Complete this exercise __________ times a day. This information is not intended to replace advice given to you by your health care provider. Make sure you discuss any questions you have with your health care provider. Document Released: 08/18/2005 Document Revised: 04/24/2016 Document Reviewed: 09/01/2015 Elsevier Interactive Patient Education  2017 [employer]  

## 2017-02-18 DIAGNOSIS — E042 Nontoxic multinodular goiter: Secondary | ICD-10-CM | POA: Diagnosis not present

## 2017-02-18 DIAGNOSIS — E890 Postprocedural hypothyroidism: Secondary | ICD-10-CM | POA: Diagnosis not present

## 2017-02-25 DIAGNOSIS — Z8639 Personal history of other endocrine, nutritional and metabolic disease: Secondary | ICD-10-CM | POA: Diagnosis not present

## 2017-02-25 DIAGNOSIS — E042 Nontoxic multinodular goiter: Secondary | ICD-10-CM | POA: Diagnosis not present

## 2017-02-25 DIAGNOSIS — Z923 Personal history of irradiation: Secondary | ICD-10-CM | POA: Diagnosis not present

## 2017-05-06 ENCOUNTER — Ambulatory Visit: Payer: Medicare Other | Admitting: Family Medicine

## 2017-05-06 ENCOUNTER — Ambulatory Visit (INDEPENDENT_AMBULATORY_CARE_PROVIDER_SITE_OTHER): Payer: Medicare Other | Admitting: Family Medicine

## 2017-05-06 ENCOUNTER — Encounter: Payer: Self-pay | Admitting: Family Medicine

## 2017-05-06 VITALS — Temp 98.6°F | Resp 16 | Ht 67.0 in | Wt 206.5 lb

## 2017-05-06 DIAGNOSIS — E559 Vitamin D deficiency, unspecified: Secondary | ICD-10-CM

## 2017-05-06 DIAGNOSIS — E669 Obesity, unspecified: Secondary | ICD-10-CM | POA: Diagnosis not present

## 2017-05-06 DIAGNOSIS — J209 Acute bronchitis, unspecified: Secondary | ICD-10-CM

## 2017-05-06 DIAGNOSIS — E890 Postprocedural hypothyroidism: Secondary | ICD-10-CM | POA: Diagnosis not present

## 2017-05-06 DIAGNOSIS — Z79899 Other long term (current) drug therapy: Secondary | ICD-10-CM

## 2017-05-06 DIAGNOSIS — E7800 Pure hypercholesterolemia, unspecified: Secondary | ICD-10-CM

## 2017-05-06 MED ORDER — BENZONATATE 100 MG PO CAPS: 100.0000 mg | ORAL_CAPSULE | Freq: Three times a day (TID) | ORAL | 0 refills | Status: DC | PRN

## 2017-05-06 MED ORDER — FLUTICASONE FUROATE-VILANTEROL 200-25 MCG/INH IN AEPB: 1.0000 | INHALATION_SPRAY | Freq: Every day | RESPIRATORY_TRACT | 0 refills | Status: DC

## 2017-05-06 NOTE — Addendum Note (Signed)
Addended by: Inda Coke on: 05/06/2017 03:58 PM   Modules accepted: Orders

## 2017-05-06 NOTE — Progress Notes (Signed)
Name: Lauren Johnston   MRN: [id number]    DOB: April 11, 1951   Date:05/06/2017       Progress Note  Subjective  Chief Complaint  Chief Complaint  Patient presents with  . Laryngitis    Onset-1 week, coughing up yellow and greenish thick phlegm, sneezing, chest congestion. Patient has tried [employer], [employer] Plus gel with some relief.    HPI  URI: her daughter just found out that her 67 yo daughter has lymphoma. She has been worried and not sleeping well. 8 days ago she developed hoarseness followed by a productive cough, she denies fever, chills, chest pain or wheezing. Hoarseness has improved but continues to cough and is affecting her sleep. She never smoked and there is no history of asthma. She is taking otc medication without significant help.    Patient Active Problem List   Diagnosis Date Noted  . Class 1 obesity in adult 11/11/2016  . Osteopenia 10/09/2016  . Special screening for malignant neoplasms, colon   . Rectal polyp   . Right wrist tendonitis 05/16/2016  . Osteoarthritis of left hip 05/16/2016  . Vitamin D deficiency 02/05/2016  . Cold sore 02/05/2016  . Degeneration of intervertebral disc of cervical region 02/05/2016  . Insomnia 04/23/2015  . Major depression in remission ([place]) 04/23/2015  . Chronic pain 04/23/2015  . Goiter, nontoxic, 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  . 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 Uterine leiomyoma 06/16/2007    Past Surgical History:  Procedure Laterality Date  . ABDOMINAL HYSTERECTOMY    . BILATERAL OOPHORECTOMY    . COLONOSCOPY WITH PROPOFOL N/A 06/02/2016   Procedure: COLONOSCOPY WITH PROPOFOL;  Surgeon: Lucilla Lame, MD;  Location: [facility];  Service: Endoscopy;  Laterality: N/A;  . dequavian tendonitis    . KNEE ARTHROSCOPY    . POLYPECTOMY  06/02/2016   Procedure: POLYPECTOMY;  Surgeon: Lucilla Lame, MD;  Location: [facility];  Service: Endoscopy;;    Family History  Problem Relation Age of Onset  . Diabetes  Mother   . Emphysema Father   . Multiple myeloma Brother     Social History   Social History  . Marital status: Widowed    Spouse name: N/A  . Number of children: N/A  . Years of education: N/A   Occupational History  . Not on file.   Social History Main Topics  . Smoking status: Never Smoker  . Smokeless tobacco: Never Used  . Alcohol use No  . Drug use: No  . Sexual activity: Yes    Partners: Male    Birth control/ protection: Post-menopausal   Other Topics Concern  . Not on file   Social History Narrative  . No narrative on file     Current Outpatient Prescriptions:  .  aspirin 81 MG tablet, Take 81 mg by mouth daily., Disp: , Rfl:  .  atorvastatin (LIPITOR) 20 MG tablet, Take 1 tablet (20 mg total) by mouth daily., Disp: 90 tablet, Rfl: 1 .  Calcium Carb-Cholecalciferol (CALCIUM 1000 + D PO), Take by mouth., Disp: , Rfl:  .  DULoxetine (CYMBALTA) 60 MG capsule, Take 1 capsule (60 mg total) by mouth daily., Disp: 90 capsule, Rfl: 1 .  estradiol (ESTRACE) 0.5 MG tablet, Take 1 tablet (0.5 mg total) by mouth daily., Disp: 90 tablet, Rfl: 1 .  levothyroxine (SYNTHROID, LEVOTHROID) 50 MCG tablet, Take 1 tablet (50 mcg total) by mouth daily before breakfast., Disp: 90 tablet, Rfl: 1 .  traZODone (DESYREL) 50 MG tablet, Take 0.5-1  tablets (25-50 mg total) by mouth at bedtime as needed for sleep., Disp: 90 tablet, Rfl: 0 .  benzonatate (TESSALON) 100 MG capsule, Take 1-2 capsules (100-200 mg total) by mouth 3 (three) times daily as needed., Disp: 40 capsule, Rfl: 0 .  fluticasone furoate-vilanterol (BREO ELLIPTA) 200-25 MCG/INH AEPB, Inhale 1 puff into the lungs daily., Disp: 14 each, Rfl: 0  Allergies  Allergen Reactions  . Sulfamethoxazole-Trimethoprim Other (See Comments) and Rash  . Citalopram   . Tetracycline Other (See Comments)    Other Reaction: URTICARIA  . Tetracyclines & Related Itching  . Codeine Itching     ROS  Ten systems reviewed and is negative  except as mentioned in HPI   Objective  Vitals:   05/06/17 1446  Resp: 16  Temp: 98.6 F (37 C)  TempSrc: Oral  SpO2: 96%  Weight: 206 lb 8 oz (93.7 kg)  Height: '5\' 7"'  (1.702 m)    Body mass index is 32.34 kg/m.  Physical Exam  Constitutional: Patient appears well-developed and well-nourished. Obese No distress.  HEENT: head atraumatic, normocephalic, pupils equal and reactive to light,  neck supple, throat within normal limits Cardiovascular: Normal rate, regular rhythm and normal heart sounds.  No murmur heard. No BLE edema. Pulmonary/Chest: Effort normal and breath sounds normal. No respiratory distress. Abdominal: Soft.  There is no tenderness. Psychiatric: Patient has a normal mood and affect. behavior is normal. Judgment and thought content normal.   PHQ2/9: Depression screen [facility] 2/9 11/11/2016 07/09/2016 05/07/2016 02/05/2016 08/29/2015  Decreased Interest 0 0 0 0 0  Down, Depressed, Hopeless 0 0 0 0 0  PHQ - 2 Score 0 0 0 0 0    Fall Risk: Fall Risk  05/06/2017 11/11/2016 07/09/2016 05/07/2016 02/05/2016  Falls in the past year? No No No No No    Functional Status Survey: Is the patient deaf or have difficulty hearing?: No Does the patient have difficulty seeing, even when wearing glasses/contacts?: No Does the patient have difficulty concentrating, remembering, or making decisions?: No Does the patient have difficulty walking or climbing stairs?: No Does the patient have difficulty dressing or bathing?: No Does the patient have difficulty doing errands alone such as visiting a doctor's office or shopping?: No   Assessment & Plan  1. Acute bronchitis, unspecified organism  Likely viral, we will try inhaler and cough suppressant and if not better call us back  - fluticasone furoate-vilanterol (BREO ELLIPTA) 200-25 MCG/INH AEPB; Inhale 1 puff into the lungs daily.  Dispense: 14 each; Refill: 0 - benzonatate (TESSALON) 100 MG capsule; Take 1-2 capsules (100-200 mg total)  by mouth 3 (three) times daily as needed.  Dispense: 40 capsule; Refill: 0  2. Vitamin D deficiency  - VITAMIN D 25 Hydroxy (Vit-D Deficiency, Fractures)  3. Hypothyroidism, postablative  - TSH  4. Pure hypercholesterolemia  - Lipid panel  5. Long-term use of high-risk medication  - COMPLETE METABOLIC PANEL WITH GFR - CBC with Differential/Platelet  6. Obesity (BMI 30.0-34.9)  - Insulin, fasting

## 2017-05-12 DIAGNOSIS — E890 Postprocedural hypothyroidism: Secondary | ICD-10-CM | POA: Diagnosis not present

## 2017-05-12 DIAGNOSIS — E559 Vitamin D deficiency, unspecified: Secondary | ICD-10-CM | POA: Diagnosis not present

## 2017-05-12 DIAGNOSIS — E669 Obesity, unspecified: Secondary | ICD-10-CM | POA: Diagnosis not present

## 2017-05-12 DIAGNOSIS — E7800 Pure hypercholesterolemia, unspecified: Secondary | ICD-10-CM | POA: Diagnosis not present

## 2017-05-12 NOTE — Addendum Note (Signed)
Addended by: Inda Coke on: 05/12/2017 09:27 AM   Modules accepted: Orders

## 2017-05-13 LAB — CBC WITH DIFFERENTIAL/PLATELET
BASOS ABS: 28 {cells}/uL (ref 0–200)
BASOS PCT: 0.5 %
EOS PCT: 4.2 %
Eosinophils Absolute: 231 cells/uL (ref 15–500)
HEMATOCRIT: 38 % (ref 35.0–45.0)
HEMOGLOBIN: 12.5 g/dL (ref 11.7–15.5)
LYMPHS ABS: 1711 {cells}/uL (ref 850–3900)
MCH: 29.8 pg (ref 27.0–33.0)
MCHC: 32.9 g/dL (ref 32.0–36.0)
MCV: 90.7 fL (ref 80.0–100.0)
MONOS PCT: 7.9 %
MPV: 10 fL (ref 7.5–12.5)
NEUTROS ABS: 3097 {cells}/uL (ref 1500–7800)
Neutrophils Relative %: 56.3 %
Platelets: 289 10*3/uL (ref 140–400)
RBC: 4.19 10*6/uL (ref 3.80–5.10)
RDW: 12.8 % (ref 11.0–15.0)
Total Lymphocyte: 31.1 %
WBC mixed population: 435 cells/uL (ref 200–950)
WBC: 5.5 10*3/uL (ref 3.8–10.8)

## 2017-05-13 LAB — TSH: TSH: 2.01 mIU/L (ref 0.40–4.50)

## 2017-05-13 LAB — COMPLETE METABOLIC PANEL WITH GFR
AG RATIO: 1.2 (calc) (ref 1.0–2.5)
ALT: 13 U/L (ref 6–29)
AST: 17 U/L (ref 10–35)
Albumin: 3.8 g/dL (ref 3.6–5.1)
Alkaline phosphatase (APISO): 62 U/L (ref 33–130)
BILIRUBIN TOTAL: 0.6 mg/dL (ref 0.2–1.2)
BUN/Creatinine Ratio: 14 (calc) (ref 6–22)
BUN: 16 mg/dL (ref 7–25)
CHLORIDE: 104 mmol/L (ref 98–110)
CO2: 26 mmol/L (ref 20–32)
Calcium: 9.1 mg/dL (ref 8.6–10.4)
Creat: 1.17 mg/dL — ABNORMAL HIGH (ref 0.50–0.99)
GFR, EST AFRICAN AMERICAN: 56 mL/min/{1.73_m2} — AB (ref >=60)
GFR, Est Non African American: 49 mL/min/{1.73_m2} — ABNORMAL LOW (ref >=60)
GLUCOSE: 92 mg/dL (ref 65–99)
Globulin: 3.1 g/dL (calc) (ref 1.9–3.7)
POTASSIUM: 4.1 mmol/L (ref 3.5–5.3)
Sodium: 139 mmol/L (ref 135–146)
TOTAL PROTEIN: 6.9 g/dL (ref 6.1–8.1)

## 2017-05-13 LAB — LIPID PANEL
CHOL/HDL RATIO: 2.9 (calc) (ref <5.0)
Cholesterol: 145 mg/dL (ref <200)
HDL: 50 mg/dL — ABNORMAL LOW (ref >50)
LDL Cholesterol (Calc): 80 mg/dL (calc)
NON-HDL CHOLESTEROL (CALC): 95 mg/dL (ref <130)
TRIGLYCERIDES: 69 mg/dL (ref <150)

## 2017-05-13 LAB — INSULIN, FASTING: Insulin: 6.5 u[IU]/mL (ref 2.0–19.6)

## 2017-05-13 LAB — VITAMIN D 25 HYDROXY (VIT D DEFICIENCY, FRACTURES): Vit D, 25-Hydroxy: 44 ng/mL (ref 30–100)

## 2017-05-14 ENCOUNTER — Encounter: Payer: Self-pay | Admitting: Family Medicine

## 2017-05-14 ENCOUNTER — Ambulatory Visit (INDEPENDENT_AMBULATORY_CARE_PROVIDER_SITE_OTHER): Payer: Medicare Other | Admitting: Family Medicine

## 2017-05-14 VITALS — BP 106/68 | HR 90 | Temp 98.1°F | Resp 14 | Ht 67.0 in | Wt 204.7 lb

## 2017-05-14 DIAGNOSIS — E669 Obesity, unspecified: Secondary | ICD-10-CM

## 2017-05-14 DIAGNOSIS — E7800 Pure hypercholesterolemia, unspecified: Secondary | ICD-10-CM

## 2017-05-14 DIAGNOSIS — Z23 Encounter for immunization: Secondary | ICD-10-CM

## 2017-05-14 DIAGNOSIS — M159 Polyosteoarthritis, unspecified: Secondary | ICD-10-CM

## 2017-05-14 DIAGNOSIS — M8949 Other hypertrophic osteoarthropathy, multiple sites: Secondary | ICD-10-CM

## 2017-05-14 DIAGNOSIS — E890 Postprocedural hypothyroidism: Secondary | ICD-10-CM

## 2017-05-14 DIAGNOSIS — F331 Major depressive disorder, recurrent, moderate: Secondary | ICD-10-CM

## 2017-05-14 DIAGNOSIS — F5101 Primary insomnia: Secondary | ICD-10-CM | POA: Diagnosis not present

## 2017-05-14 DIAGNOSIS — E559 Vitamin D deficiency, unspecified: Secondary | ICD-10-CM

## 2017-05-14 DIAGNOSIS — J209 Acute bronchitis, unspecified: Secondary | ICD-10-CM | POA: Diagnosis not present

## 2017-05-14 DIAGNOSIS — E66811 Obesity, class 1: Secondary | ICD-10-CM

## 2017-05-14 DIAGNOSIS — M150 Primary generalized (osteo)arthritis: Secondary | ICD-10-CM

## 2017-05-14 MED ORDER — ATORVASTATIN CALCIUM 20 MG PO TABS: 20.0000 mg | ORAL_TABLET | Freq: Every day | ORAL | 1 refills | Status: DC

## 2017-05-14 MED ORDER — GUAIFENESIN ER 600 MG PO TB12: 600.0000 mg | ORAL_TABLET | Freq: Two times a day (BID) | ORAL | 0 refills | Status: DC

## 2017-05-14 MED ORDER — DULOXETINE HCL 60 MG PO CPEP: 60.0000 mg | ORAL_CAPSULE | Freq: Every day | ORAL | 1 refills | Status: DC

## 2017-05-14 MED ORDER — TRAZODONE HCL 50 MG PO TABS: 25.0000 mg | ORAL_TABLET | Freq: Every evening | ORAL | 0 refills | Status: DC | PRN

## 2017-05-14 MED ORDER — BENZONATATE 200 MG PO CAPS: 200.0000 mg | ORAL_CAPSULE | Freq: Three times a day (TID) | ORAL | 0 refills | Status: DC | PRN

## 2017-05-14 MED ORDER — LEVOTHYROXINE SODIUM 50 MCG PO TABS: 50.0000 ug | ORAL_TABLET | Freq: Every day | ORAL | 1 refills | Status: DC

## 2017-05-14 NOTE — Progress Notes (Addendum)
Name: Lauren Johnston   MRN: [id number]    DOB: 04-28-1951   Date:05/14/2017       Progress Note  Subjective  Chief Complaint  Chief Complaint  Patient presents with  . Follow-up    3 months  . Cough    REFILL    HPI   Hypothyroidism: she is taking medication, denies dysphagia, no weight gain or constipation, last TSH is has been at goal, denies constipation or fatigue. Doing well.   Vitamin D deficiency: she has been taking otc supplementation   Insomnia: we stopped Ambien Summer 2017, but unable to tolerate side effects of Seroquel, but doing well on Trazodone 25 mg qhs and is able to fall and stay asleep.   Depression Major recurrent: she was very depressed when husband died from complications of CHF in 6010, she was very angry because he did not tell her how sick he was and it caught her by surprise. Mother died end of 03-01-16 and was feeling lost initially, still grieving , she is getting counseling through her church. She states Cymbalta was started for back pain back years ago, but the best benefit was clearing her mid and being able to cope with stress. She feels well, still has some grieving but doing well. She is dating her old high school sweetheart.   Obesity: she is going to try cutting down on ice cream, she eats when stressed out, she is down to one ice cream cone every other day, but has lost 4 lbs since last visit.   OA: she has a history of bilateral knee arthroscopic surgery, she states occasionally has knee pain, but no effusion. She has been working more in her garden.   Bronchitis: she was seen last week because of bronchitis, she is doing well on Tessalon perles, but would like a refill, did not get Mucinex and stated Breo too expensive, voice is back to normal, not coughing as much, no fever, denies fatigue.  Dyslipidemia: discussed ways to increase HDL, and continue statin therapy, LDL is at goal   Patient Active Problem List   Diagnosis Date Noted   . Class 1 obesity in adult 11/11/2016  . Osteopenia 10/09/2016  . Special screening for malignant neoplasms, colon   . Rectal polyp   . Right wrist tendonitis 05/16/2016  . Osteoarthritis of left hip 05/16/2016  . Vitamin D deficiency 02/05/2016  . Cold sore 02/05/2016  . Degeneration of intervertebral disc of cervical region 02/05/2016  . Insomnia 04/23/2015  . Major depression in remission ([place]) 04/23/2015  . Chronic pain 04/23/2015  . Goiter, nontoxic, 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  . 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 Uterine leiomyoma 06/16/2007    Past Surgical History:  Procedure Laterality Date  . ABDOMINAL HYSTERECTOMY    . BILATERAL OOPHORECTOMY    . COLONOSCOPY WITH PROPOFOL N/A 06/02/2016   Procedure: COLONOSCOPY WITH PROPOFOL;  Surgeon: Lucilla Lame, MD;  Location: [facility];  Service: Endoscopy;  Laterality: N/A;  . dequavian tendonitis    . KNEE ARTHROSCOPY    . POLYPECTOMY  06/02/2016   Procedure: POLYPECTOMY;  Surgeon: Lucilla Lame, MD;  Location: [facility];  Service: Endoscopy;;    Family History  Problem Relation Age of Onset  . Diabetes Mother   . Emphysema Father   . Multiple myeloma Brother     Social History   Social History  . Marital status: Widowed    Spouse name: N/A  . Number of children: N/A  . Years  of education: N/A   Occupational History  . Not on file.   Social History Main Topics  . Smoking status: Never Smoker  . Smokeless tobacco: Never Used  . Alcohol use No  . Drug use: No  . Sexual activity: No   Other Topics Concern  . Not on file   Social History Narrative  . No narrative on file     Current Outpatient Prescriptions:  .  aspirin 81 MG tablet, Take 81 mg by mouth daily., Disp: , Rfl:  .  atorvastatin (LIPITOR) 20 MG tablet, Take 1 tablet (20 mg total) by mouth daily., Disp: 90 tablet, Rfl: 1 .  benzonatate (TESSALON) 200 MG capsule, Take 1 capsule (200 mg total) by mouth 3 (three) times daily  as needed., Disp: 40 capsule, Rfl: 0 .  Calcium Carb-Cholecalciferol (CALCIUM 1000 + D PO), Take by mouth., Disp: , Rfl:  .  DULoxetine (CYMBALTA) 60 MG capsule, Take 1 capsule (60 mg total) by mouth daily., Disp: 90 capsule, Rfl: 1 .  estradiol (ESTRACE) 0.5 MG tablet, Take 1 tablet (0.5 mg total) by mouth daily., Disp: 90 tablet, Rfl: 1 .  levothyroxine (SYNTHROID, LEVOTHROID) 50 MCG tablet, Take 1 tablet (50 mcg total) by mouth daily before breakfast., Disp: 90 tablet, Rfl: 1 .  traZODone (DESYREL) 50 MG tablet, Take 0.5-1 tablets (25-50 mg total) by mouth at bedtime as needed for sleep., Disp: 90 tablet, Rfl: 0 .  fluticasone furoate-vilanterol (BREO ELLIPTA) 200-25 MCG/INH AEPB, Inhale 1 puff into the lungs daily. (Patient not taking: Reported on 05/14/2017), Disp: 14 each, Rfl: 0 .  guaiFENesin (MUCINEX) 600 MG 12 hr tablet, Take 1 tablet (600 mg total) by mouth 2 (two) times daily., Disp: 10 tablet, Rfl: 0  Allergies  Allergen Reactions  . Sulfamethoxazole-Trimethoprim Other (See Comments) and Rash  . Citalopram   . Tetracycline Other (See Comments)    Other Reaction: URTICARIA  . Tetracyclines & Related Itching  . Codeine Itching     ROS  Constitutional: Negative for fever, positive for  weight change.  Respiratory: Positive  for cough but no  shortness of breath.   Cardiovascular: Negative for chest pain or palpitations.  Gastrointestinal: Negative for abdominal pain, no bowel changes.  Musculoskeletal: Negative for gait problem or joint swelling.  Skin: Negative for rash.  Neurological: Negative for dizziness or headache.  No other specific complaints in a complete review of systems (except as listed in HPI above).  Objective  Vitals:   05/14/17 1140  BP: 106/68  Pulse: 90  Resp: 14  Temp: 98.1 F (36.7 C)  TempSrc: Oral  SpO2: 95%  Weight: 204 lb 11.2 oz (92.9 kg)  Height: '5\' 7"'  (1.702 m)    Body mass index is 32.06 kg/m.  Physical Exam  Constitutional:  Patient appears well-developed and well-nourished. Obese No distress.  HEENT: head atraumatic, normocephalic, pupils equal and reactive to light,  neck supple, throat within normal limits Cardiovascular: Normal rate, regular rhythm and normal heart sounds.  No murmur heard. No BLE edema. Pulmonary/Chest: Effort normal and breath sounds normal. No respiratory distress. Abdominal: Soft.  There is no tenderness. Psychiatric: Patient has a normal mood and affect. behavior is normal. Judgment and thought content normal.  Recent Results (from the past 2160 hour(s))  Vitamin D (25 hydroxy)     Status: None   Collection Time: 05/12/17 11:14 AM  Result Value Ref Range   Vit D, 25-Hydroxy 44 30 - 100 ng/mL    Comment: Vitamin  D Status         25-OH Vitamin D: . Deficiency:                    <20 ng/mL Insufficiency:             20 - 29 ng/mL Optimal:                 > or = 30 ng/mL . For 25-OH Vitamin D testing on patients on  D2-supplementation and patients for whom quantitation  of D2 and D3 fractions is required, the QuestAssureD(TM) 25-OH VIT D, (D2,D3), LC/MS/MS is recommended: order  code [phone] (patients >2yr). . For more information on this test, go to: http://education.quest[facility]ostics.com/faq/FAQ163 (This link is being provided for  informational/educational purposes only.)   TSH     Status: None   Collection Time: 05/12/17 11:14 AM  Result Value Ref Range   TSH 2.01 0.40 - 4.50 mIU/L  Lipid Profile     Status: Abnormal   Collection Time: 05/12/17 11:14 AM  Result Value Ref Range   Cholesterol 145 <200 mg/dL   HDL 50 (L) >50 mg/dL   Triglycerides 69 <150 mg/dL   LDL Cholesterol (Calc) 80 mg/dL (calc)    Comment: Reference range: <100 . Desirable range <100 mg/dL for primary prevention;   <70 mg/dL for patients with CHD or diabetic patients  with > or = 2 CHD risk factors. .Marland KitchenLDL-C is now calculated using the Martin-Hopkins  calculation, which is a validated novel method  providing  better accuracy than the Friedewald equation in the  estimation of LDL-C.  MCresenciano Genreet al. JAnnamaria Helling [id number];409(81: [phone]  (http://education.[facility].com/faq/FAQ164)    Total CHOL/HDL Ratio 2.9 <5.0 (calc)   Non-HDL Cholesterol (Calc) 95 <130 mg/dL (calc)    Comment: For patients with diabetes plus 1 major ASCVD risk  factor, treating to a non-HDL-C goal of <100 mg/dL  (LDL-C of <70 mg/dL) is considered a therapeutic  option.   Insulin, Fasting     Status: None   Collection Time: 05/12/17 11:14 AM  Result Value Ref Range   Insulin 6.5 2.0 - 19.6 uIU/mL    Comment: This insulin assay shows strong cross-reactivity for some insulin analogs (lispro, aspart, and glargine) and much lower cross-reactivity with others (detemir, glulisine).   COMPLETE METABOLIC PANEL WITH GFR     Status: Abnormal   Collection Time: 05/12/17 11:14 AM  Result Value Ref Range   Glucose, Bld 92 65 - 99 mg/dL    Comment: .            Fasting reference interval .    BUN 16 7 - 25 mg/dL   Creat 1.17 (H) 0.50 - 0.99 mg/dL    Comment: For patients >422years of age, the reference limit for Creatinine is approximately 13% higher for people identified as African-American. .    GFR, Est Non African American 49 (L) > OR = 60 mL/min/1.728m  GFR, Est African American 56 (L) > OR = 60 mL/min/1.7342m BUN/Creatinine Ratio 14 6 - 22 (calc)   Sodium 139 135 - 146 mmol/L   Potassium 4.1 3.5 - 5.3 mmol/L   Chloride 104 98 - 110 mmol/L   CO2 26 20 - 32 mmol/L   Calcium 9.1 8.6 - 10.4 mg/dL   Total Protein 6.9 6.1 - 8.1 g/dL   Albumin 3.8 3.6 - 5.1 g/dL   Globulin 3.1 1.9 - 3.7 g/dL (calc)   AG Ratio 1.2  1.0 - 2.5 (calc)   Total Bilirubin 0.6 0.2 - 1.2 mg/dL   Alkaline phosphatase (APISO) 62 33 - 130 U/L   AST 17 10 - 35 U/L   ALT 13 6 - 29 U/L  CBC with Differential/Platelet     Status: None   Collection Time: 05/12/17 11:14 AM  Result Value Ref Range   WBC 5.5 3.8 - 10.8 Thousand/uL    RBC 4.19 3.80 - 5.10 Million/uL   Hemoglobin 12.5 11.7 - 15.5 g/dL   HCT 38.0 35.0 - 45.0 %   MCV 90.7 80.0 - 100.0 fL   MCH 29.8 27.0 - 33.0 pg   MCHC 32.9 32.0 - 36.0 g/dL   RDW 12.8 11.0 - 15.0 %   Platelets 289 140 - 400 Thousand/uL   MPV 10.0 7.5 - 12.5 fL   Neutro Abs 3,097 1,500 - 7,800 cells/uL   Lymphs Abs 1,711 850 - 3,900 cells/uL   WBC mixed population 435 200 - 950 cells/uL   Eosinophils Absolute 231 15 - 500 cells/uL   Basophils Absolute 28 0 - 200 cells/uL   Neutrophils Relative % 56.3 %   Total Lymphocyte 31.1 %   Monocytes Relative 7.9 %   Eosinophils Relative 4.2 %   Basophils Relative 0.5 %      PHQ2/9: Depression screen [facility] 2/9 05/14/2017 11/11/2016 07/09/2016 05/07/2016 02/05/2016  Decreased Interest 0 0 0 0 0  Down, Depressed, Hopeless 0 0 0 0 0  PHQ - 2 Score 0 0 0 0 0     Fall Risk: Fall Risk  05/14/2017 05/06/2017 11/11/2016 07/09/2016 05/07/2016  Falls in the past year? No No No No No     Functional Status Survey: Is the patient deaf or have difficulty hearing?: No Does the patient have difficulty seeing, even when wearing glasses/contacts?: Yes Does the patient have difficulty concentrating, remembering, or making decisions?: No Does the patient have difficulty walking or climbing stairs?: No Does the patient have difficulty dressing or bathing?: No Does the patient have difficulty doing errands alone such as visiting a doctor's office or shopping?: No   Assessment & Plan  1. Acute bronchitis, unspecified organism  - benzonatate (TESSALON) 200 MG capsule; Take 1 capsule (200 mg total) by mouth 3 (three) times daily as needed.  Dispense: 40 capsule; Refill: 0 - guaiFENesin (MUCINEX) 600 MG 12 hr tablet; Take 1 tablet (600 mg total) by mouth 2 (two) times daily.  Dispense: 10 tablet; Refill: 0  2. Needs flu shot  - Flu vaccine HIGH DOSE PF (Fluzone High dose)  3. Vitamin D deficiency  Continue supplementation   4. Hypothyroidism,  postablative  Reviewed labs, doing well  - levothyroxine (SYNTHROID, LEVOTHROID) 50 MCG tablet; Take 1 tablet (50 mcg total) by mouth daily before breakfast.  Dispense: 90 tablet; Refill: 1  5. Pure hypercholesterolemia  - atorvastatin (LIPITOR) 20 MG tablet; Take 1 tablet (20 mg total) by mouth daily.  Dispense: 90 tablet; Refill: 1  6. Obesity (BMI 30.0-34.9)  She lost a few pounds and is doing well with lifestyle modification   7. Primary osteoarthritis involving multiple joints  Doing well , taking Tylenol prn   8. Moderate recurrent major depression (HCC)  - DULoxetine (CYMBALTA) 60 MG capsule; Take 1 capsule (60 mg total) by mouth daily.  Dispense: 90 capsule; Refill: 1  9. Primary insomnia  - traZODone (DESYREL) 50 MG tablet; Take 0.5-1 tablets (25-50 mg total) by mouth at bedtime as needed for sleep.  Dispense: 90  tablet; Refill: 0

## 2017-05-15 ENCOUNTER — Ambulatory Visit: Payer: Medicare Other | Admitting: Family Medicine

## 2017-05-18 ENCOUNTER — Ambulatory Visit: Payer: Medicare Other | Admitting: Family Medicine

## 2017-05-29 ENCOUNTER — Telehealth: Payer: Self-pay | Admitting: Family Medicine

## 2017-05-29 DIAGNOSIS — F5101 Primary insomnia: Secondary | ICD-10-CM

## 2017-05-29 NOTE — Telephone Encounter (Signed)
Pt would like a refill on Trazodone to be sent to [facility].

## 2017-05-29 NOTE — Telephone Encounter (Signed)
Refilled 05/14/2017, with at least a 90 day supply - too soon for refill.

## 2017-06-30 DIAGNOSIS — M170 Bilateral primary osteoarthritis of knee: Secondary | ICD-10-CM | POA: Diagnosis not present

## 2017-07-13 ENCOUNTER — Other Ambulatory Visit: Payer: Self-pay | Admitting: Family Medicine

## 2017-07-13 DIAGNOSIS — F5101 Primary insomnia: Secondary | ICD-10-CM

## 2017-07-13 DIAGNOSIS — F331 Major depressive disorder, recurrent, moderate: Secondary | ICD-10-CM

## 2017-07-13 MED ORDER — DULOXETINE HCL 60 MG PO CPEP: 60.0000 mg | ORAL_CAPSULE | Freq: Every day | ORAL | 0 refills | Status: DC

## 2017-07-13 MED ORDER — TRAZODONE HCL 50 MG PO TABS: 25.0000 mg | ORAL_TABLET | Freq: Every evening | ORAL | 0 refills | Status: DC | PRN

## 2017-07-13 NOTE — Telephone Encounter (Signed)
Copied from [place] [phone]. Topic: Quick Communication - See Telephone Encounter >> Jul 13, 2017 10:33 AM Boyd Kerbs wrote: CRM for notification. See Telephone encounter for:  Patient is needing to get a 30 day supply for Duloxetin 60mg  and Tracodone 50mg  as she is out or all most out. Please call into [facility] on [address]. [place].  She has appt. On Friday and will check with Dr. Ancil Boozer on medication mail in order refills.   07/13/17.

## 2017-07-17 ENCOUNTER — Encounter: Payer: Self-pay | Admitting: Family Medicine

## 2017-07-17 ENCOUNTER — Ambulatory Visit (INDEPENDENT_AMBULATORY_CARE_PROVIDER_SITE_OTHER): Payer: Medicare Other | Admitting: Family Medicine

## 2017-07-17 VITALS — BP 118/74 | HR 72 | Temp 97.6°F | Resp 16 | Ht 67.0 in | Wt 203.1 lb

## 2017-07-17 DIAGNOSIS — F331 Major depressive disorder, recurrent, moderate: Secondary | ICD-10-CM | POA: Diagnosis not present

## 2017-07-17 DIAGNOSIS — N183 Chronic kidney disease, stage 3 unspecified: Secondary | ICD-10-CM

## 2017-07-17 DIAGNOSIS — Z780 Asymptomatic menopausal state: Secondary | ICD-10-CM | POA: Diagnosis not present

## 2017-07-17 DIAGNOSIS — Z23 Encounter for immunization: Secondary | ICD-10-CM

## 2017-07-17 DIAGNOSIS — Z0000 Encounter for general adult medical examination without abnormal findings: Secondary | ICD-10-CM | POA: Diagnosis not present

## 2017-07-17 DIAGNOSIS — Z113 Encounter for screening for infections with a predominantly sexual mode of transmission: Secondary | ICD-10-CM

## 2017-07-17 DIAGNOSIS — F5101 Primary insomnia: Secondary | ICD-10-CM | POA: Diagnosis not present

## 2017-07-17 DIAGNOSIS — M25512 Pain in left shoulder: Secondary | ICD-10-CM | POA: Diagnosis not present

## 2017-07-17 DIAGNOSIS — M170 Bilateral primary osteoarthritis of knee: Secondary | ICD-10-CM

## 2017-07-17 DIAGNOSIS — Z1231 Encounter for screening mammogram for malignant neoplasm of breast: Secondary | ICD-10-CM

## 2017-07-17 MED ORDER — ESTRADIOL 0.5 MG PO TABS: 0.5000 mg | ORAL_TABLET | Freq: Every day | ORAL | 1 refills | Status: DC

## 2017-07-17 MED ORDER — TRAZODONE HCL 50 MG PO TABS: 25.0000 mg | ORAL_TABLET | Freq: Every evening | ORAL | 1 refills | Status: DC | PRN

## 2017-07-17 MED ORDER — DULOXETINE HCL 60 MG PO CPEP: 60.0000 mg | ORAL_CAPSULE | Freq: Every day | ORAL | 1 refills | Status: DC

## 2017-07-17 NOTE — Progress Notes (Signed)
Patient: Lauren Johnston, Female    DOB: [birth year], 66 y.o.   MRN: [id number]  Visit Date: 07/17/2017  Today's Provider: Loistine Chance, MD   Chief Complaint  Patient presents with  . Medicare Wellness  . Shoulder Pain    Subjective:    HPI Lauren Johnston is a 66 y.o. female who presents today for her Subsequent Annual Wellness Visit.  She is not currently sexually active, s/p hysterectomy, no vaginal discharge. Getting married Dec 9th, 2018.   Left shoulder pain: she states that her left shoulder has been bothering her for the past couple of months, pain with abduction of left arm.   Knee osteoarthritis: seen by Dr. Harlow Mares, had steroid injection and given Meloxicam, she has not been taking Meloxicam because of afraid of kidney dysfunction but states that pain on both knees has improved, no effusion or erythema. Not affect her gait  CKI stage III: she has decreased GFR but stable over the past 18 months, denies pruritus, or decrease in urinary output  Patient/Caregiver input:    Review of Systems  Constitutional: Negative for fever or weight change.  Respiratory: Negative for cough and shortness of breath.   Cardiovascular: Negative for chest pain or palpitations.  Gastrointestinal: Negative for abdominal pain, no bowel changes.  Musculoskeletal: Negative for gait problem or joint swelling.  Skin: Negative for rash.  Neurological: Negative for dizziness or headache.  No other specific complaints in a complete review of systems (except as listed in HPI above).  Past Medical History:  Diagnosis Date  . Arthritis    hips  . Dental crowns present    dental implants - upper  . Hyperlipidemia   . Hypothyroidism   . Thyroid disease     Past Surgical History:  Procedure Laterality Date  . ABDOMINAL HYSTERECTOMY    . BILATERAL OOPHORECTOMY    . COLONOSCOPY WITH PROPOFOL N/A 06/02/2016   Procedure: COLONOSCOPY WITH PROPOFOL;  Surgeon: Lucilla Lame, MD;  Location: [place];  Service: Endoscopy;  Laterality: N/A;  . dequavian tendonitis    . KNEE ARTHROSCOPY    . POLYPECTOMY  06/02/2016   Procedure: POLYPECTOMY;  Surgeon: Lucilla Lame, MD;  Location: [place];  Service: Endoscopy;;    Family History  Problem Relation Age of Onset  . Diabetes Mother   . Heart disease Mother   . Emphysema Father   . Heart disease Father   . Multiple myeloma Brother   . Alzheimer's disease Sister     Social History   Socioeconomic History  . Marital status: Widowed    Spouse name: Not on file  . Number of children: 3  . Years of education: Not on file  . Highest education level: Not on file  Social Needs  . Financial resource strain: Not hard at all  . Food insecurity - worry: Never true  . Food insecurity - inability: Never true  . Transportation needs - medical: No  . Transportation needs - non-medical: No  Occupational History  . Not on file  Tobacco Use  . Smoking status: Never Smoker  . Smokeless tobacco: Never Used  Substance and Sexual Activity  . Alcohol use: No    Alcohol/week: 0.0 oz  . Drug use: No  . Sexual activity: No    Partners: Male    Birth control/protection: Post-menopausal  Other Topics Concern  . Not on file  Social History Narrative   Widow   Dating for the past 2 years, getting  married Dec 9th, 2018    Outpatient Encounter Medications as of 07/17/2017  Medication Sig  . aspirin 81 MG tablet Take 81 mg by mouth daily.  Marland Kitchen atorvastatin (LIPITOR) 20 MG tablet Take 1 tablet (20 mg total) by mouth daily.  . Calcium Carb-Cholecalciferol (CALCIUM 1000 + D PO) Take by mouth.  . DULoxetine (CYMBALTA) 60 MG capsule Take 1 capsule (60 mg total) daily by mouth.  . estradiol (ESTRACE) 0.5 MG tablet Take 1 tablet (0.5 mg total) daily by mouth.  . levothyroxine (SYNTHROID, LEVOTHROID) 50 MCG tablet Take 1 tablet (50 mcg total) by mouth daily before breakfast.  . meloxicam (MOBIC) 15 MG tablet Mobic 15 mg tablet  Take  1 tablet every day by oral route.  . traZODone (DESYREL) 50 MG tablet Take 0.5-1 tablets (25-50 mg total) at bedtime as needed by mouth for sleep.  . [DISCONTINUED] benzonatate (TESSALON) 200 MG capsule Take 1 capsule (200 mg total) by mouth 3 (three) times daily as needed.  . [DISCONTINUED] DULoxetine (CYMBALTA) 60 MG capsule Take 1 capsule (60 mg total) daily by mouth.  . [DISCONTINUED] estradiol (ESTRACE) 0.5 MG tablet Take 1 tablet (0.5 mg total) by mouth daily.  . [DISCONTINUED] fluticasone furoate-vilanterol (BREO ELLIPTA) 200-25 MCG/INH AEPB Inhale 1 puff into the lungs daily.  . [DISCONTINUED] traZODone (DESYREL) 50 MG tablet Take 0.5-1 tablets (25-50 mg total) at bedtime as needed by mouth for sleep.  . [DISCONTINUED] guaiFENesin (MUCINEX) 600 MG 12 hr tablet Take 1 tablet (600 mg total) by mouth 2 (two) times daily. (Patient not taking: Reported on 07/17/2017)   No facility-administered encounter medications on file as of 07/17/2017.     Allergies  Allergen Reactions  . Sulfamethoxazole-Trimethoprim Other (See Comments) and Rash  . Citalopram   . Tetracycline Other (See Comments)    Other Reaction: URTICARIA  . Tetracyclines & Related Itching  . Codeine Itching    Care Team Updated in EHR: Yes  Last Vision Exam: last year  Wears corrective lenses: Yes Last Dental Exam: twice a year Last Hearing Exam: years ago  Wears Hearing Aids: No  Functional Ability / Safety Screening 1.  Was the timed Get Up and Go test shorter than 30 seconds?  yes 2.  Does the patient need help with the phone, transportation, shopping,      preparing meals, housework, laundry, medications, or managing money?  yes 3.  Is the patient's home free of loose throw rugs in walkways, pet beds, electrical cords, etc?   yes      Grab bars in the bathroom? yes      Handrails on the stairs?   yes      Adequate lighting?   yes 4.  Has the patient noticed any hearing difficulties?   no    Advanced Care  Planning: A voluntary discussion about advance care planning including the explanation and discussion of advance directives.  Discussed health care proxy and Living will, and the patient was able to identify a health care proxy as her daughter Lorrin Goodell  Patient does not have a living will at present time. If patient does have living will, I have requested they bring this to the clinic to be scanned in to their chart. Does patient have a HCPOA?    no If yes, name and contact information:  Does patient have a living will or MOST form?  no  Cancer Screenings:  Lung: Low Dose CT Chest recommended if Age 50-80 years, 30 pack-year currently smoking OR  have quit w/in 15years. Patient does not qualify. Breast: Up to date on Mammogram? Yes  Up to date of Bone Density/Dexa? Yes Colon: up to date    Objective:   Vitals: BP 118/74 (BP Location: Right Arm, Patient Position: Sitting, Cuff Size: Large)   Pulse 72   Temp 97.6 F (36.4 C) (Oral)   Resp 16   Ht '5\' 7"'  (1.702 m)   Wt 203 lb 1.6 oz (92.1 kg)   SpO2 95%   BMI 31.81 kg/m  Body mass index is 31.81 kg/m.  No exam data present  Physical Exam Constitutional: Patient appears well-developed and well-nourished. Obese No distress.  HEENT: head atraumatic, normocephalic, pupils equal and reactive to light,neck supple, throat within normal limits Cardiovascular: Normal rate, regular rhythm and normal heart sounds.  No murmur heard. No BLE edema. Breast: normal breast exam GYN: normal external genitalia Pulmonary/Chest: Effort normal and breath sounds normal. No respiratory distress. Abdominal: Soft.  There is no tenderness. Muscular Skeletal: crepitus with extension of both knees, no effusion, pain with abduction of left shoulder, but full rom Psychiatric: Patient has a normal mood and affect. behavior is normal. Judgment and thought content normal.  Cognitive Testing - 6-CIT  Correct? Score   What year is it? yes 0 Yes = 0    No = 4  What  month is it? yes 0 Yes = 0    No = 3  Remember:     Pia [address]au, [place], [place]     What time is it? yes 0 Yes = 0    No = 3  Count backwards from 20 to 1 yes 0 Correct = 0    1 error = 2   More than 1 error = 4  Say the months of the year in reverse. yes 0 Correct = 0    1 error = 2   More than 1 error = 4  What address did I ask you to remember? yes 0 Correct = 0  1 error = 2    2 error = 4    3 error = 6    4 error = 8    All wrong = 10       TOTAL SCORE  0/28   Interpretation:  Normal  Normal (0-7) Abnormal (8-28)   Fall Risk: Fall Risk  07/17/2017 05/14/2017 05/06/2017 11/11/2016 07/09/2016  Falls in the past year? No No No No No    Depression Screen Depression screen [facility] 2/9 07/17/2017 05/14/2017 11/11/2016 07/09/2016 05/07/2016  Decreased Interest 0 0 0 0 0  Down, Depressed, Hopeless 0 0 0 0 0  PHQ - 2 Score 0 0 0 0 0    Recent Results (from the past 2160 hour(s))  Vitamin D (25 hydroxy)     Status: None   Collection Time: 05/12/17 11:14 AM  Result Value Ref Range   Vit D, 25-Hydroxy 44 30 - 100 ng/mL    Comment: Vitamin D Status         25-OH Vitamin D: . Deficiency:                    <20 ng/mL Insufficiency:             20 - 29 ng/mL Optimal:                 > or = 30 ng/mL . For 25-OH Vitamin D testing on patients on  D2-supplementation and patients  for whom quantitation  of D2 and D3 fractions is required, the QuestAssureD(TM) 25-OH VIT D, (D2,D3), LC/MS/MS is recommended: order  code [phone] (patients >35yr). . For more information on this test, go to: http://education.quest[facility].com/faq/FAQ163 (This link is being provided for  informational/educational purposes only.)   TSH     Status: None   Collection Time: 05/12/17 11:14 AM  Result Value Ref Range   TSH 2.01 0.40 - 4.50 mIU/L  Lipid Profile     Status: Abnormal   Collection Time: 05/12/17 11:14 AM  Result Value Ref Range   Cholesterol 145 <200 mg/dL   HDL 50 (L) >50 mg/dL   Triglycerides 69  <150 mg/dL   LDL Cholesterol (Calc) 80 mg/dL (calc)    Comment: Reference range: <100 . Desirable range <100 mg/dL for primary prevention;   <70 mg/dL for patients with CHD or diabetic patients  with > or = 2 CHD risk factors. .Marland KitchenLDL-C is now calculated using the Martin-Hopkins  calculation, which is a validated novel method providing  better accuracy than the Friedewald equation in the  estimation of LDL-C.  MCresenciano Genreet al. JAnnamaria Helling [id number](16: [id number]  (http://education.[facility].com/faq/FAQ164)    Total CHOL/HDL Ratio 2.9 <5.0 (calc)   Non-HDL Cholesterol (Calc) 95 <130 mg/dL (calc)    Comment: For patients with diabetes plus 1 major ASCVD risk  factor, treating to a non-HDL-C goal of <100 mg/dL  (LDL-C of <70 mg/dL) is considered a therapeutic  option.   Insulin, Fasting     Status: None   Collection Time: 05/12/17 11:14 AM  Result Value Ref Range   Insulin 6.5 2.0 - 19.6 uIU/mL    Comment: This insulin assay shows strong cross-reactivity for some insulin analogs (lispro, aspart, and glargine) and much lower cross-reactivity with others (detemir, glulisine).   COMPLETE METABOLIC PANEL WITH GFR     Status: Abnormal   Collection Time: 05/12/17 11:14 AM  Result Value Ref Range   Glucose, Bld 92 65 - 99 mg/dL    Comment: .            Fasting reference interval .    BUN 16 7 - 25 mg/dL   Creat 1.17 (H) 0.50 - 0.99 mg/dL    Comment: For patients >478years of age, the reference limit for Creatinine is approximately 13% higher for people identified as African-American. .    GFR, Est Non African American 49 (L) > OR = 60 mL/min/1.729m  GFR, Est African American 56 (L) > OR = 60 mL/min/1.7359m BUN/Creatinine Ratio 14 6 - 22 (calc)   Sodium 139 135 - 146 mmol/L   Potassium 4.1 3.5 - 5.3 mmol/L   Chloride 104 98 - 110 mmol/L   CO2 26 20 - 32 mmol/L   Calcium 9.1 8.6 - 10.4 mg/dL   Total Protein 6.9 6.1 - 8.1 g/dL   Albumin 3.8 3.6 - 5.1 g/dL   Globulin 3.1  1.9 - 3.7 g/dL (calc)   AG Ratio 1.2 1.0 - 2.5 (calc)   Total Bilirubin 0.6 0.2 - 1.2 mg/dL   Alkaline phosphatase (APISO) 62 33 - 130 U/L   AST 17 10 - 35 U/L   ALT 13 6 - 29 U/L  CBC with Differential/Platelet     Status: None   Collection Time: 05/12/17 11:14 AM  Result Value Ref Range   WBC 5.5 3.8 - 10.8 Thousand/uL   RBC 4.19 3.80 - 5.10 Million/uL   Hemoglobin 12.5 11.7 - 15.5 g/dL  HCT 38.0 35.0 - 45.0 %   MCV 90.7 80.0 - 100.0 fL   MCH 29.8 27.0 - 33.0 pg   MCHC 32.9 32.0 - 36.0 g/dL   RDW 12.8 11.0 - 15.0 %   Platelets 289 140 - 400 Thousand/uL   MPV 10.0 7.5 - 12.5 fL   Neutro Abs 3,097 1,500 - 7,800 cells/uL   Lymphs Abs 1,711 850 - 3,900 cells/uL   WBC mixed population 435 200 - 950 cells/uL   Eosinophils Absolute 231 15 - 500 cells/uL   Basophils Absolute 28 0 - 200 cells/uL   Neutrophils Relative % 56.3 %   Total Lymphocyte 31.1 %   Monocytes Relative 7.9 %   Eosinophils Relative 4.2 %   Basophils Relative 0.5 %    Assessment & Plan:    1. Medicare annual wellness visit, subsequent  Discussed with adolescent  and caregiver the importance of limiting screen time to no more than 2 hours per day, exercise daily for at least 2 hours, eat 6 servings of fruit and vegetables daily, eat tree nuts ( pistachios, pecans , almonds...) one serving every other day, eat fish twice weekly. Read daily. Get involved in school. Have responsibilities  at home. To avoid STI's, practice abstinence, if unable use condoms and stick with one partner.  Discussed importance of contraception if sexually active to avoid unwanted pregnancy.   2. Moderate recurrent major depression (HCC)  - DULoxetine (CYMBALTA) 60 MG capsule; Take 1 capsule (60 mg total) daily by mouth.  Dispense: 90 capsule; Refill: 1  3. Menopause  - estradiol (ESTRACE) 0.5 MG tablet; Take 1 tablet (0.5 mg total) daily by mouth.  Dispense: 90 tablet; Refill: 1  4. Primary insomnia  - traZODone (DESYREL) 50 MG  tablet; Take 0.5-1 tablets (25-50 mg total) at bedtime as needed by mouth for sleep.  Dispense: 90 tablet; Refill: 1  5. Need for pneumococcal vaccine  - Pneumococcal conjugate vaccine 13-valent IM  6. Encounter for screening mammogram for breast cancer  - MM DIGITAL SCREENING BILATERAL; Future  7. Acute pain of left shoulder  She wants to try home exercises first  8. Stage 3 chronic kidney disease (HCC)  Avoid nsaids, take tylenol for pain  9. Primary osteoarthritis of both knees   10. Routine screening for STI (sexually transmitted infection)  - HIV antibody - RPR - Hepatitis, Acute  Immunization History  Administered Date(s) Administered  . Influenza Split 06/16/2007, 06/19/2008, 09/07/2008, 07/05/2009, 04/25/2010  . Influenza, High Dose Seasonal PF 05/14/2017  . Influenza, Seasonal, Injecte, Preservative Fre 06/25/2011, 05/26/2012, 05/18/2013  . Influenza,inj,Quad PF,6+ Mos 06/07/2014, 04/23/2015, 05/07/2016  . Influenza-Unspecified 06/07/2014  . Pneumococcal Polysaccharide-23 02/05/2016  . Tdap 08/01/2010  . Zoster 04/07/2011    Health Maintenance  Topic Date Due  . PNA vac Low Risk Adult (2 of 2 - PCV13) 02/04/2017  . MAMMOGRAM  07/17/2018  . TETANUS/TDAP  08/01/2020  . COLONOSCOPY  06/02/2021  . INFLUENZA VACCINE  Completed  . DEXA SCAN  Completed  . Hepatitis C Screening  Completed    Meds ordered this encounter  Medications  . meloxicam (MOBIC) 15 MG tablet    Sig: Mobic 15 mg tablet  Take 1 tablet every day by oral route.  . DULoxetine (CYMBALTA) 60 MG capsule    Sig: Take 1 capsule (60 mg total) daily by mouth.    Dispense:  90 capsule    Refill:  1  . estradiol (ESTRACE) 0.5 MG tablet    Sig:  Take 1 tablet (0.5 mg total) daily by mouth.    Dispense:  90 tablet    Refill:  1  . traZODone (DESYREL) 50 MG tablet    Sig: Take 0.5-1 tablets (25-50 mg total) at bedtime as needed by mouth for sleep.    Dispense:  90 tablet    Refill:  1     Current Outpatient Medications:  .  aspirin 81 MG tablet, Take 81 mg by mouth daily., Disp: , Rfl:  .  atorvastatin (LIPITOR) 20 MG tablet, Take 1 tablet (20 mg total) by mouth daily., Disp: 90 tablet, Rfl: 1 .  Calcium Carb-Cholecalciferol (CALCIUM 1000 + D PO), Take by mouth., Disp: , Rfl:  .  DULoxetine (CYMBALTA) 60 MG capsule, Take 1 capsule (60 mg total) daily by mouth., Disp: 90 capsule, Rfl: 1 .  estradiol (ESTRACE) 0.5 MG tablet, Take 1 tablet (0.5 mg total) daily by mouth., Disp: 90 tablet, Rfl: 1 .  levothyroxine (SYNTHROID, LEVOTHROID) 50 MCG tablet, Take 1 tablet (50 mcg total) by mouth daily before breakfast., Disp: 90 tablet, Rfl: 1 .  meloxicam (MOBIC) 15 MG tablet, Mobic 15 mg tablet  Take 1 tablet every day by oral route., Disp: , Rfl:  .  traZODone (DESYREL) 50 MG tablet, Take 0.5-1 tablets (25-50 mg total) at bedtime as needed by mouth for sleep., Disp: 90 tablet, Rfl: 1 Medications Discontinued During This Encounter  Medication Reason  . benzonatate (TESSALON) 200 MG capsule Completed Course  . fluticasone furoate-vilanterol (BREO ELLIPTA) 200-25 MCG/INH AEPB Completed Course  . guaiFENesin (MUCINEX) 600 MG 12 hr tablet Completed Course  . DULoxetine (CYMBALTA) 60 MG capsule Reorder  . estradiol (ESTRACE) 0.5 MG tablet Reorder  . traZODone (DESYREL) 50 MG tablet Reorder    I have personally reviewed and addressed the Medicare Annual Wellness health risk assessment questionnaire and have noted the following in the patient's chart:  A.         Medical and social history & family history B.         Use of alcohol, tobacco, and illicit drugs  C.         Current medications and supplements D.         Functional and Cognitive ability and status E.         Nutritional status F.         Physical activity G.        Advance directives H.         List of other physicians I.          Hospitalizations, surgeries, and ER visits in previous 12 months J.         Grant such as hearing, vision, cognitive function, and depression L.         Referrals and appointments: she will schedule her mammogram  In addition, I have reviewed and discussed with patient certain preventive protocols, quality metrics, and best practice recommendations. A written personalized care plan for preventive services as well as general preventive health recommendations were provided to patient.  See attached scanned questionnaire for additional information.

## 2017-07-17 NOTE — Patient Instructions (Signed)
Shoulder Exercises Ask your health care provider which exercises are safe for you. Do exercises exactly as told by your health care provider and adjust them as directed. It is normal to feel mild stretching, pulling, tightness, or discomfort as you do these exercises, but you should stop right away if you feel sudden pain or your pain gets worse.Do not begin these exercises until told by your health care provider. RANGE OF MOTION EXERCISES These exercises warm up your muscles and joints and improve the movement and flexibility of your shoulder. These exercises also help to relieve pain, numbness, and tingling. These exercises involve stretching your injured shoulder directly. Exercise A: Pendulum  1. Stand near a wall or a surface that you can hold onto for balance. 2. Bend at the waist and let your left / right arm hang straight down. Use your other arm to support you. Keep your back straight and do not lock your knees. 3. Relax your left / right arm and shoulder muscles, and move your hips and your trunk so your left / right arm swings freely. Your arm should swing because of the motion of your body, not because you are using your arm or shoulder muscles. 4. Keep moving your body so your arm swings in the following directions, as told by your health care provider: ? Side to side. ? Forward and backward. ? In clockwise and counterclockwise circles. 5. Continue each motion for __________ seconds, or for as long as told by your health care provider. 6. Slowly return to the starting position. Repeat __________ times. Complete this exercise __________ times a day. Exercise B:Flexion, Standing  1. Stand and hold a broomstick, a cane, or a similar object. Place your hands a little more than shoulder-width apart on the object. Your left / right hand should be palm-up, and your other hand should be palm-down. 2. Keep your elbow straight and keep your shoulder muscles relaxed. Push the stick down with  your healthy arm to raise your left / right arm in front of your body, and then over your head until you feel a stretch in your shoulder. ? Avoid shrugging your shoulder while you raise your arm. Keep your shoulder blade tucked down toward the middle of your back. 3. Hold for __________ seconds. 4. Slowly return to the starting position. Repeat __________ times. Complete this exercise __________ times a day. Exercise C: Abduction, Standing 1. Stand and hold a broomstick, a cane, or a similar object. Place your hands a little more than shoulder-width apart on the object. Your left / right hand should be palm-up, and your other hand should be palm-down. 2. While keeping your elbow straight and your shoulder muscles relaxed, push the stick across your body toward your left / right side. Raise your left / right arm to the side of your body and then over your head until you feel a stretch in your shoulder. ? Do not raise your arm above shoulder height, unless your health care provider tells you to do that. ? Avoid shrugging your shoulder while you raise your arm. Keep your shoulder blade tucked down toward the middle of your back. 3. Hold for __________ seconds. 4. Slowly return to the starting position. Repeat __________ times. Complete this exercise __________ times a day. Exercise D:Internal Rotation  1. Place your left / right hand behind your back, palm-up. 2. Use your other hand to dangle an exercise band, a towel, or a similar object over your shoulder. Grasp the band with   your left / right hand so you are holding onto both ends. 3. Gently pull up on the band until you feel a stretch in the front of your left / right shoulder. ? Avoid shrugging your shoulder while you raise your arm. Keep your shoulder blade tucked down toward the middle of your back. 4. Hold for __________ seconds. 5. Release the stretch by letting go of the band and lowering your hands. Repeat __________ times. Complete  this exercise __________ times a day. STRETCHING EXERCISES These exercises warm up your muscles and joints and improve the movement and flexibility of your shoulder. These exercises also help to relieve pain, numbness, and tingling. These exercises are done using your healthy shoulder to help stretch the muscles of your injured shoulder. Exercise E: Corner Stretch (External Rotation and Abduction)  1. Stand in a doorway with one of your feet slightly in front of the other. This is called a staggered stance. If you cannot reach your forearms to the door frame, stand facing a corner of a room. 2. Choose one of the following positions as told by your health care provider: ? Place your hands and forearms on the door frame above your head. ? Place your hands and forearms on the door frame at the height of your head. ? Place your hands on the door frame at the height of your elbows. 3. Slowly move your weight onto your front foot until you feel a stretch across your chest and in the front of your shoulders. Keep your head and chest upright and keep your abdominal muscles tight. 4. Hold for __________ seconds. 5. To release the stretch, shift your weight to your back foot. Repeat __________ times. Complete this stretch __________ times a day. Exercise F:Extension, Standing 1. Stand and hold a broomstick, a cane, or a similar object behind your back. ? Your hands should be a little wider than shoulder-width apart. ? Your palms should face away from your back. 2. Keeping your elbows straight and keeping your shoulder muscles relaxed, move the stick away from your body until you feel a stretch in your shoulder. ? Avoid shrugging your shoulders while you move the stick. Keep your shoulder blade tucked down toward the middle of your back. 3. Hold for __________ seconds. 4. Slowly return to the starting position. Repeat __________ times. Complete this exercise __________ times a day. STRENGTHENING  EXERCISES These exercises build strength and endurance in your shoulder. Endurance is the ability to use your muscles for a long time, even after they get tired. Exercise G:External Rotation  1. Sit in a stable chair without armrests. 2. Secure an exercise band at elbow height on your left / right side. 3. Place a soft object, such as a folded towel or a small pillow, between your left / right upper arm and your body to move your elbow a few inches away (about 10 cm) from your side. 4. Hold the end of the band so it is tight and there is no slack. 5. Keeping your elbow pressed against the soft object, move your left / right forearm out, away from your abdomen. Keep your body steady so only your forearm moves. 6. Hold for __________ seconds. 7. Slowly return to the starting position. Repeat __________ times. Complete this exercise __________ times a day. Exercise H:Shoulder Abduction  1. Sit in a stable chair without armrests, or stand. 2. Hold a __________ weight in your left / right hand, or hold an exercise band with both hands.   3. Start with your arms straight down and your left / right palm facing in, toward your body. 4. Slowly lift your left / right hand out to your side. Do not lift your hand above shoulder height unless your health care provider tells you that this is safe. ? Keep your arms straight. ? Avoid shrugging your shoulder while you do this movement. Keep your shoulder blade tucked down toward the middle of your back. 5. Hold for __________ seconds. 6. Slowly lower your arm, and return to the starting position. Repeat __________ times. Complete this exercise __________ times a day. Exercise I:Shoulder Extension 1. Sit in a stable chair without armrests, or stand. 2. Secure an exercise band to a stable object in front of you where it is at shoulder height. 3. Hold one end of the exercise band in each hand. Your palms should face each other. 4. Straighten your elbows and  lift your hands up to shoulder height. 5. Step back, away from the secured end of the exercise band, until the band is tight and there is no slack. 6. Squeeze your shoulder blades together as you pull your hands down to the sides of your thighs. Stop when your hands are straight down by your sides. Do not let your hands go behind your body. 7. Hold for __________ seconds. 8. Slowly return to the starting position. Repeat __________ times. Complete this exercise __________ times a day. Exercise J:Standing Shoulder Row 1. Sit in a stable chair without armrests, or stand. 2. Secure an exercise band to a stable object in front of you so it is at waist height. 3. Hold one end of the exercise band in each hand. Your palms should be in a thumbs-up position. 4. Bend each of your elbows to an "L" shape (about 90 degrees) and keep your upper arms at your sides. 5. Step back until the band is tight and there is no slack. 6. Slowly pull your elbows back behind you. 7. Hold for __________ seconds. 8. Slowly return to the starting position. Repeat __________ times. Complete this exercise __________ times a day. Exercise K:Shoulder Press-Ups  1. Sit in a stable chair that has armrests. Sit upright, with your feet flat on the floor. 2. Put your hands on the armrests so your elbows are bent and your fingers are pointing forward. Your hands should be about even with the sides of your body. 3. Push down on the armrests and use your arms to lift yourself off of the chair. Straighten your elbows and lift yourself up as much as you comfortably can. ? Move your shoulder blades down, and avoid letting your shoulders move up toward your ears. ? Keep your feet on the ground. As you get stronger, your feet should support less of your body weight as you lift yourself up. 4. Hold for __________ seconds. 5. Slowly lower yourself back into the chair. Repeat __________ times. Complete this exercise __________ times a  day. Exercise L: Wall Push-Ups  1. Stand so you are facing a stable wall. Your feet should be about one arm-length away from the wall. 2. Lean forward and place your palms on the wall at shoulder height. 3. Keep your feet flat on the floor as you bend your elbows and lean forward toward the wall. 4. Hold for __________ seconds. 5. Straighten your elbows to push yourself back to the starting position. Repeat __________ times. Complete this exercise __________ times a day. This information is not intended to replace advice   given to you by your health care provider. Make sure you discuss any questions you have with your health care provider. Document Released: 07/02/2005 Document Revised: 05/12/2016 Document Reviewed: 04/29/2015 Elsevier Interactive Patient Education  2018 [place] Cuff Tendinitis Rotator cuff tendinitis is inflammation of the tough, cord-like bands that connect muscle to bone (tendons) in the rotator cuff. The rotator cuff includes all of the muscles and tendons that connect the arm to the shoulder. The rotator cuff holds the head of the upper arm bone (humerus) in the cup (fossa) of the shoulder blade (scapula). This condition can lead to a long-lasting (chronic) tear. The tear may be partial or complete. What are the causes? This condition is usually caused by overusing the rotator cuff. What increases the risk? This condition is more likely to develop in athletes and workers who frequently use their shoulder or reach over their heads. This can include activities such as:  Tennis.  Baseball or softball.  Swimming.  Construction work.  Painting.  What are the signs or symptoms? Symptoms of this condition include:  Pain spreading (radiating) from the shoulder to the upper arm.  Swelling and tenderness in front of the shoulder.  Pain when reaching, pulling, or lifting the arm above the head.  Pain when lowering the arm from above the head.  Minor  pain in the shoulder when resting.  Increased pain in the shoulder at night.  Difficulty placing the arm behind the back.  How is this diagnosed? This condition is diagnosed with a medical history and physical exam. Tests may also be done, including:  X-rays.  MRI.  Ultrasounds.  CT or MR arthrogram. During this test, a contrast material is injected and then images are taken.  How is this treated? Treatment for this condition depends on the severity of the condition. In less severe cases, treatment may include:  Rest. This may be done with a sling that holds the shoulder still (immobilization). Your health care provider may also recommend avoiding activities that involve lifting your arm over your head.  Icing the shoulder.  Anti-inflammatory medicines, such as aspirin or ibuprofen.  In more severe cases, treatment may include:  Physical therapy.  Steroid injections.  Surgery.  Follow these instructions at home: If you have a sling:  Wear the sling as told by your health care provider. Remove it only as told by your health care provider.  Loosen the sling if your fingers tingle, become numb, or turn cold and blue.  Keep the sling clean.  If the sling is not waterproof, do not let it get wet. Remove it, if allowed, or cover it with a watertight covering when you take a bath or shower. Managing pain, stiffness, and swelling  If directed, put ice on the injured area. ? If you have a removable sling, remove it as told by your health care provider. ? Put ice in a plastic bag. ? Place a towel between your skin and the bag. ? Leave the ice on for 20 minutes, 2-3 times a day.  Move your fingers often to avoid stiffness and to lessen swelling.  Raise (elevate) the injured area above the level of your heart while you are lying down.  Find a comfortable sleeping position or sleep on a recliner, if available. Driving  Do not drive or use heavy machinery while taking  prescription pain medicine.  Ask your health care provider when it is safe to drive if you have a sling on your arm.  Activity  Rest your shoulder as told by your health care provider.  Return to your normal activities as told by your health care provider. Ask your health care provider what activities are safe for you.  Do any exercises or stretches as told by your health care provider.  If you do repetitive overhead tasks, take small breaks in between and include stretching exercises as told by your health care provider. General instructions  Do not use any products that contain nicotine or tobacco, such as cigarettes and e-cigarettes. These can delay healing. If you need help quitting, ask your health care provider.  Take over-the-counter and prescription medicines only as told by your health care provider.  Keep all follow-up visits as told by your health care provider. This is important. Contact a health care provider if:  Your pain gets worse.  You have new pain in your arm, hands, or fingers.  Your pain is not relieved with medicine or does not get better after 6 weeks of treatment.  You have cracking sensations when moving your shoulder in certain directions.  You hear a snapping sound after using your shoulder, followed by severe pain and weakness. Get help right away if:  Your arm, hand, or fingers are numb or tingling.  Your arm, hand, or fingers are swollen or painful or they turn white or blue. Summary  Rotator cuff tendinitis is inflammation of the tough, cord-like bands that connect muscle to bone (tendons) in the rotator cuff.  This condition is usually caused by overusing the rotator cuff, which includes all of the muscles and tendons that connect the arm to the shoulder.  This condition is more likely to develop in athletes and workers who frequently use their shoulder or reach over their heads.  Treatment generally includes rest, anti-inflammatory  medicines, and icing. In some cases, physical therapy and steroid injections may be needed. In severe cases, surgery may be needed. This information is not intended to replace advice given to you by your health care provider. Make sure you discuss any questions you have with your health care provider. Document Released: 11/08/2003 Document Revised: 08/04/2016 Document Reviewed: 08/04/2016 Elsevier Interactive Patient Education  2017 [place] 65 Years and Older, Female Preventive care refers to lifestyle choices and visits with your health care provider that can promote health and wellness. What does preventive care include?  A yearly physical exam. This is also called an annual well check.  Dental exams once or twice a year.  Routine eye exams. Ask your health care provider how often you should have your eyes checked.  Personal lifestyle choices, including: ? Daily care of your teeth and gums. ? Regular physical activity. ? Eating a healthy diet. ? Avoiding tobacco and drug use. ? Limiting alcohol use. ? Practicing safe sex. ? Taking low-dose aspirin every day. ? Taking vitamin and mineral supplements as recommended by your health care provider. What happens during an annual well check? The services and screenings done by your health care provider during your annual well check will depend on your age, overall health, lifestyle risk factors, and family history of disease. Counseling Your health care provider may ask you questions about your:  Alcohol use.  Tobacco use.  Drug use.  Emotional well-being.  Home and relationship well-being.  Sexual activity.  Eating habits.  History of falls.  Memory and ability to understand (cognition).  Work and work Statistician.  Reproductive health.  Screening You may have the following tests or measurements:  Height, weight, and BMI.  Blood pressure.  Lipid and cholesterol levels. These may be checked  every 5 years, or more frequently if you are over 40 years old.  Skin check.  Lung cancer screening. You may have this screening every year starting at age 73 if you have a 30-pack-year history of smoking and currently smoke or have quit within the past 15 years.  Fecal occult blood test (FOBT) of the stool. You may have this test every year starting at age 41.  Flexible sigmoidoscopy or colonoscopy. You may have a sigmoidoscopy every 5 years or a colonoscopy every 10 years starting at age 67.  Hepatitis C blood test.  Hepatitis B blood test.  Sexually transmitted disease (STD) testing.  Diabetes screening. This is done by checking your blood sugar (glucose) after you have not eaten for a while (fasting). You may have this done every 1-3 years.  Bone density scan. This is done to screen for osteoporosis. You may have this done starting at age 77.  Mammogram. This may be done every 1-2 years. Talk to your health care provider about how often you should have regular mammograms.  Talk with your health care provider about your test results, treatment options, and if necessary, the need for more tests. Vaccines Your health care provider may recommend certain vaccines, such as:  Influenza vaccine. This is recommended every year.  Tetanus, diphtheria, and acellular pertussis (Tdap, Td) vaccine. You may need a Td booster every 10 years.  Varicella vaccine. You may need this if you have not been vaccinated.  Zoster vaccine. You may need this after age 45.  Measles, mumps, and rubella (MMR) vaccine. You may need at least one dose of MMR if you were born in 1957 or later. You may also need a second dose.  Pneumococcal 13-valent conjugate (PCV13) vaccine. One dose is recommended after age 29.  Pneumococcal polysaccharide (PPSV23) vaccine. One dose is recommended after age 9.  Meningococcal vaccine. You may need this if you have certain conditions.  Hepatitis A vaccine. You may need  this if you have certain conditions or if you travel or work in places where you may be exposed to hepatitis A.  Hepatitis B vaccine. You may need this if you have certain conditions or if you travel or work in places where you may be exposed to hepatitis B.  Haemophilus influenzae type b (Hib) vaccine. You may need this if you have certain conditions.  Talk to your health care provider about which screenings and vaccines you need and how often you need them. This information is not intended to replace advice given to you by your health care provider. Make sure you discuss any questions you have with your health care provider. Document Released: 09/14/2015 Document Revised: 05/07/2016 Document Reviewed: 06/19/2015 Elsevier Interactive Patient Education  2017 [employer].

## 2017-07-20 LAB — HEPATITIS PANEL, ACUTE
HEP B C IGM: NONREACTIVE
HEP B S AG: NONREACTIVE
Hep A IgM: NONREACTIVE
Hepatitis C Ab: NONREACTIVE
SIGNAL TO CUT-OFF: 0.01 (ref <1.00)

## 2017-07-20 LAB — RPR: RPR: NONREACTIVE

## 2017-07-20 LAB — HIV ANTIBODY (ROUTINE TESTING W REFLEX): HIV: NONREACTIVE

## 2017-08-01 IMAGING — MG MM DIGITAL SCREENING BILAT W/ TOMO W/ CAD
8 of 12 series · 8 of 28 positions shown · non-contrast
Comparison: Previous exam(s).

CLINICAL DATA: Screening.

EXAM:
2D DIGITAL SCREENING BILATERAL MAMMOGRAM WITH CAD AND ADJUNCT TOMO

[L MLO]
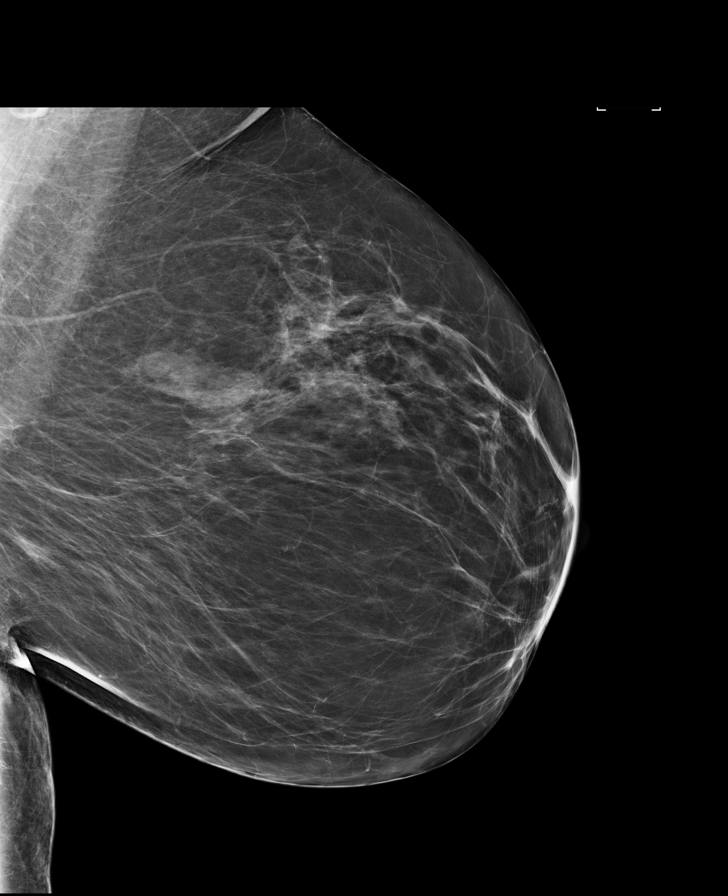

[L CC synth-2D]
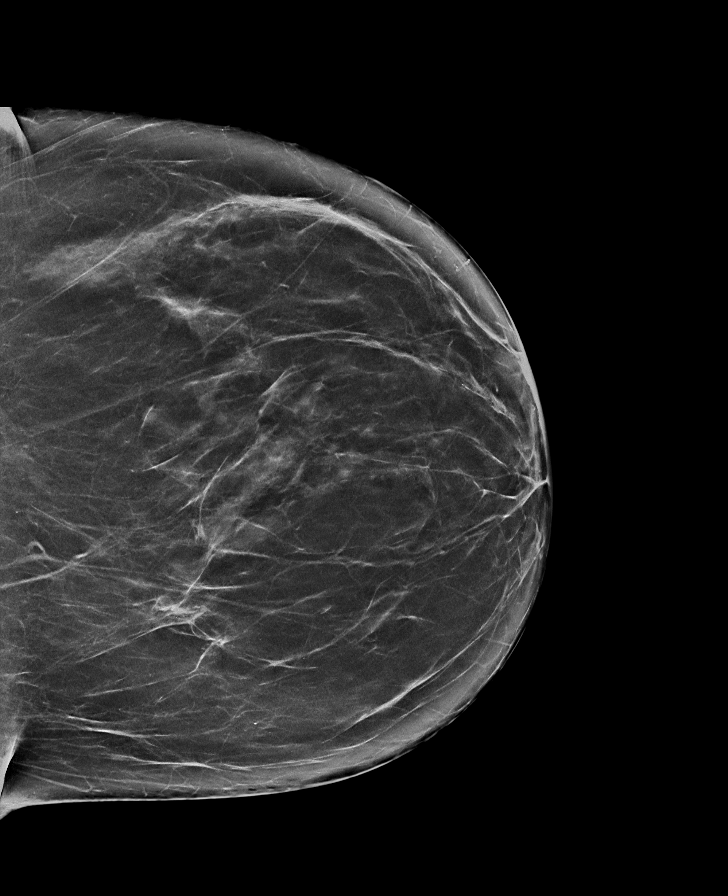

[L CC]
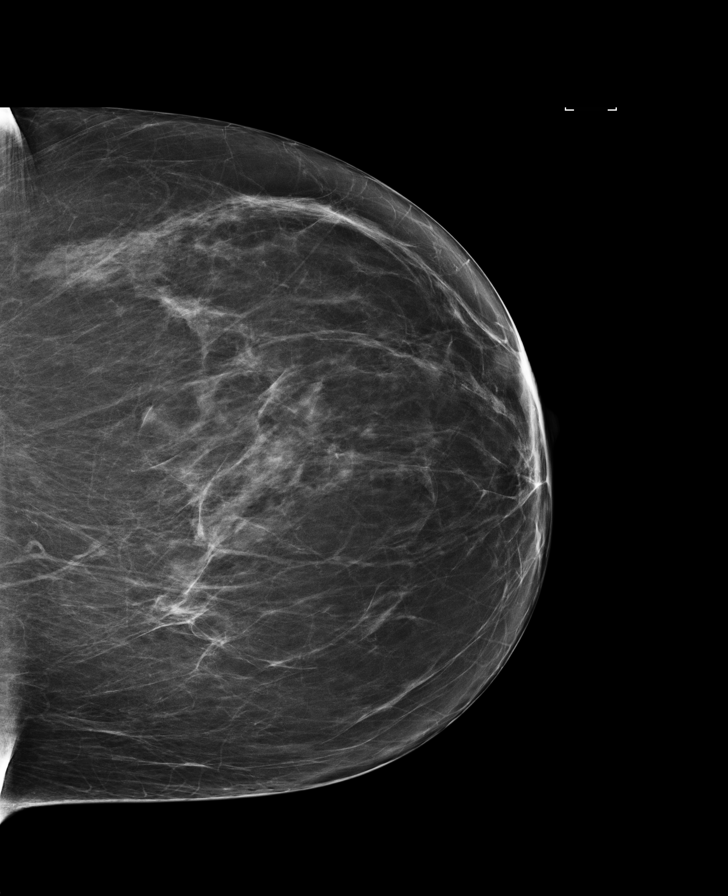

[R MLO synth-2D]
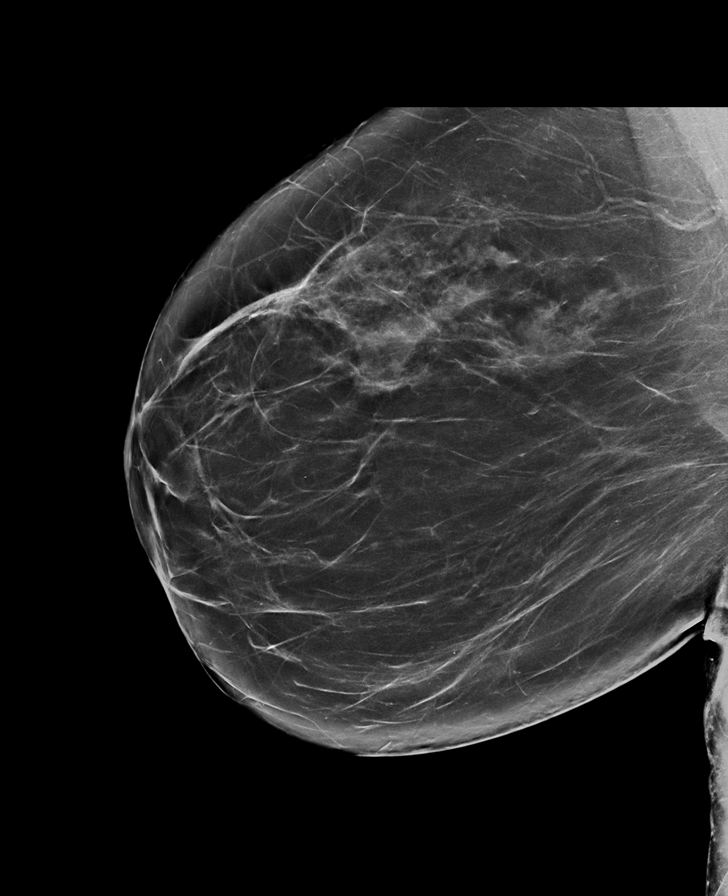

[R MLO]
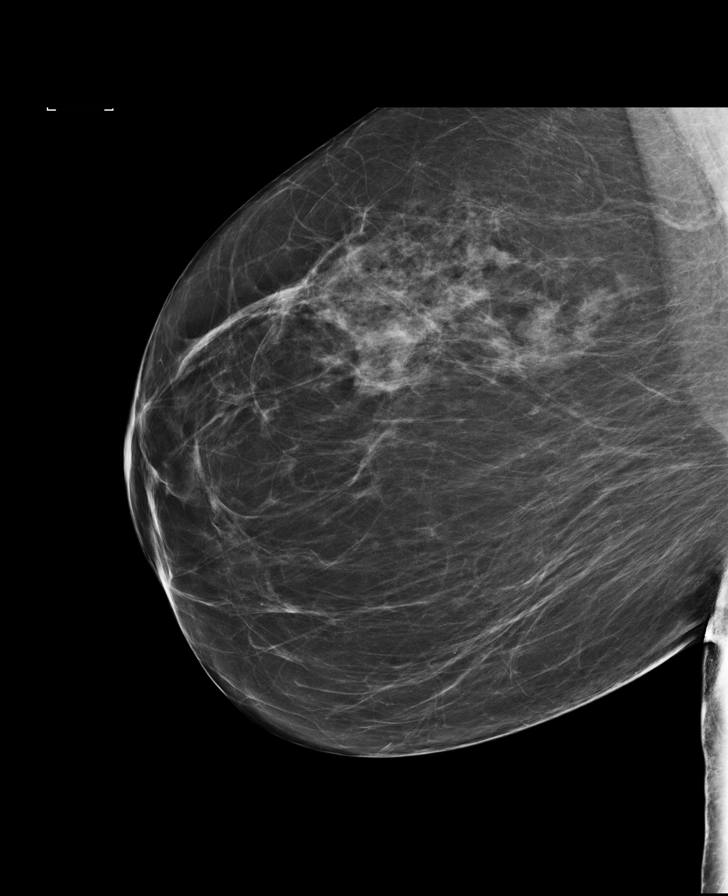

[R CC synth-2D]
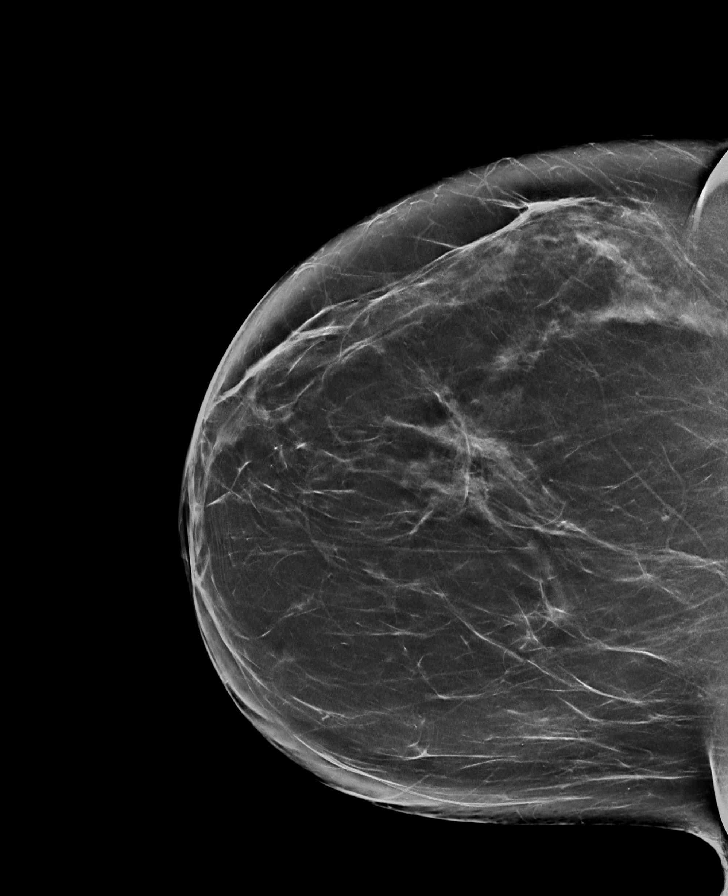

[L MLO synth-2D]
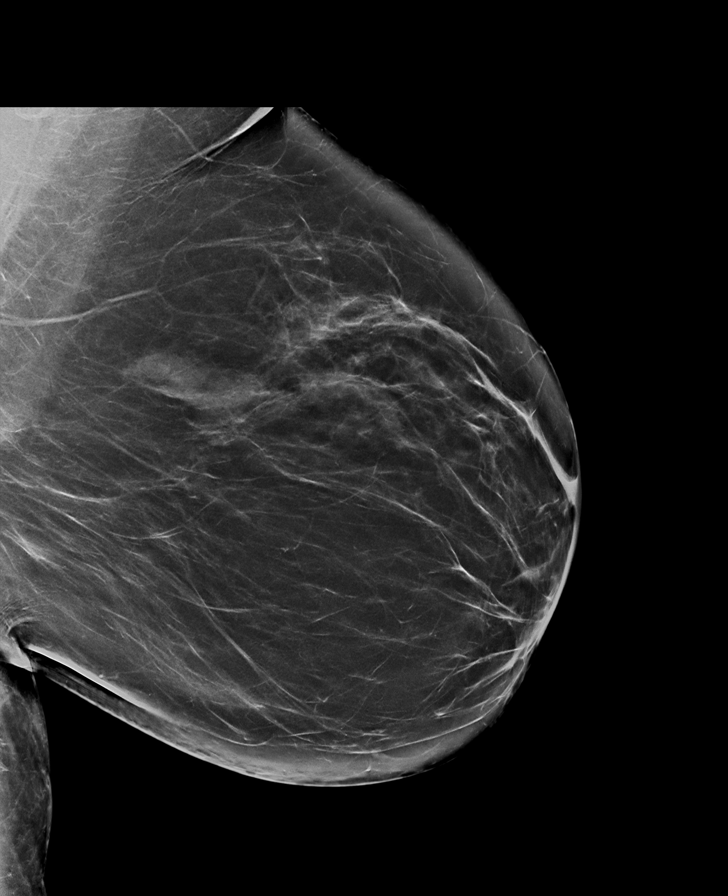

[R CC]
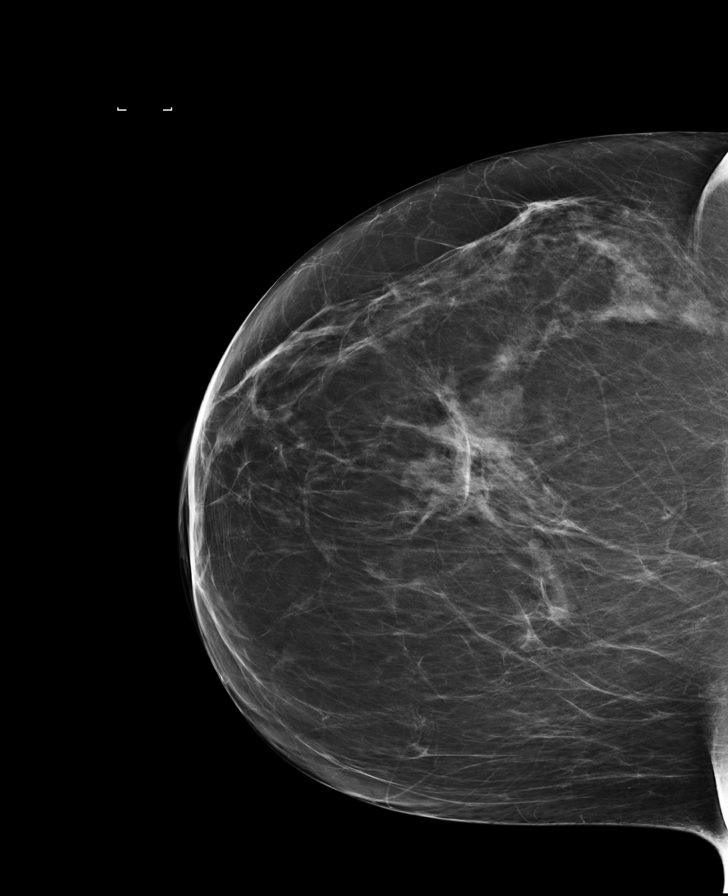

[8 of 28 positions shown; findings below may reference images not displayed]

ACR Breast Density Category b: There are scattered areas of
fibroglandular density.
FINDINGS: There are no findings suspicious for malignancy. Images were
processed with CAD.
IMPRESSION: No mammographic evidence of malignancy. A result letter of this
screening mammogram will be mailed directly to the patient.

RECOMMENDATION:
Screening mammogram in one year. (Code:97-6-RS4)

BI-RADS CATEGORY  1: Negative.

## 2017-08-13 DIAGNOSIS — H00011 Hordeolum externum right upper eyelid: Secondary | ICD-10-CM | POA: Diagnosis not present

## 2017-08-27 ENCOUNTER — Ambulatory Visit
Admission: RE | Admit: 2017-08-27 | Discharge: 2017-08-27 | Disposition: A | Discharge status: Home / Self Care | Payer: Medicare Other | Source: Ambulatory Visit | Attending: Family Medicine | Admitting: Family Medicine

## 2017-08-27 DIAGNOSIS — Z1231 Encounter for screening mammogram for malignant neoplasm of breast: Secondary | ICD-10-CM

## 2017-09-24 ENCOUNTER — Other Ambulatory Visit: Payer: Self-pay

## 2017-09-24 ENCOUNTER — Telehealth: Payer: Self-pay | Admitting: Family Medicine

## 2017-09-24 DIAGNOSIS — E890 Postprocedural hypothyroidism: Secondary | ICD-10-CM

## 2017-09-24 MED ORDER — LEVOTHYROXINE SODIUM 50 MCG PO TABS: 50.0000 ug | ORAL_TABLET | Freq: Every day | ORAL | 0 refills | Status: DC

## 2017-09-24 NOTE — Telephone Encounter (Signed)
Refill request for thyroid medication: Levothyroxine 50 mcg  Last Physical: 07/17/2017  Lab Results  Component Value Date   TSH 2.01 05/12/2017    Follow-up on file. 12/16/2017

## 2017-09-24 NOTE — Telephone Encounter (Signed)
Copied from [place] [phone]. Topic: Quick Communication - Rx Refill/Question >> Sep 24, 2017 10:39 AM Percell Belt A wrote: Medication:levothyroxine (SYNTHROID, LEVOTHROID) 50 MCG tablet [[id number]]   Has the patient contacted their pharmacy? no   (Agent: If no, request that the patient contact the pharmacy for the refill.)   Preferred Pharmacy (with phone number or street name): Pt needs a 30 day supply sent to the local pharmacy ([address] [facility])  and she also would like a 90 day supply sent to her meds my mail.     Agent: Please be advised that RX refills may take up to 3 business days. We ask that you follow-up with your pharmacy.

## 2017-12-07 ENCOUNTER — Other Ambulatory Visit: Payer: Self-pay | Admitting: Family Medicine

## 2017-12-07 DIAGNOSIS — F331 Major depressive disorder, recurrent, moderate: Secondary | ICD-10-CM

## 2017-12-07 DIAGNOSIS — J209 Acute bronchitis, unspecified: Secondary | ICD-10-CM

## 2017-12-07 MED ORDER — DULOXETINE HCL 60 MG PO CPEP: 60.0000 mg | ORAL_CAPSULE | Freq: Every day | ORAL | 0 refills | Status: DC

## 2017-12-07 NOTE — Telephone Encounter (Signed)
Copied from [place]. Topic: Quick Communication - Rx Refill/Question >> Dec 07, 2017 10:20 AM Lennox Solders wrote: Medication:duloxetine 60 mg and benzonate 200 mg. Pt has not called pharm. Pt needs #30 only on  duloxetine. Pt is waiting for mail order pharm for duloxetine. [facility] in [place] phone [phone]

## 2017-12-07 NOTE — Telephone Encounter (Signed)
Refill request for general medication: duloxetine 60 mg benzonate 200 mg  Last office visit: 07/17/2017  Last physical exam: 07/17/2017  Follow-ups on file. 12/16/2017

## 2017-12-16 ENCOUNTER — Ambulatory Visit (INDEPENDENT_AMBULATORY_CARE_PROVIDER_SITE_OTHER): Payer: Medicare Other | Admitting: Family Medicine

## 2017-12-16 ENCOUNTER — Encounter (INDEPENDENT_AMBULATORY_CARE_PROVIDER_SITE_OTHER): Payer: Self-pay

## 2017-12-16 ENCOUNTER — Encounter: Payer: Self-pay | Admitting: Family Medicine

## 2017-12-16 VITALS — BP 120/70 | HR 70 | Resp 16 | Ht 67.0 in | Wt 199.0 lb

## 2017-12-16 DIAGNOSIS — E042 Nontoxic multinodular goiter: Secondary | ICD-10-CM | POA: Diagnosis not present

## 2017-12-16 DIAGNOSIS — E7800 Pure hypercholesterolemia, unspecified: Secondary | ICD-10-CM

## 2017-12-16 DIAGNOSIS — F5101 Primary insomnia: Secondary | ICD-10-CM | POA: Diagnosis not present

## 2017-12-16 DIAGNOSIS — E890 Postprocedural hypothyroidism: Secondary | ICD-10-CM

## 2017-12-16 DIAGNOSIS — F331 Major depressive disorder, recurrent, moderate: Secondary | ICD-10-CM

## 2017-12-16 DIAGNOSIS — N183 Chronic kidney disease, stage 3 unspecified: Secondary | ICD-10-CM

## 2017-12-16 DIAGNOSIS — Z780 Asymptomatic menopausal state: Secondary | ICD-10-CM

## 2017-12-16 DIAGNOSIS — M170 Bilateral primary osteoarthritis of knee: Secondary | ICD-10-CM

## 2017-12-16 DIAGNOSIS — E669 Obesity, unspecified: Secondary | ICD-10-CM

## 2017-12-16 LAB — COMPLETE METABOLIC PANEL WITH GFR
AG RATIO: 1.3 (calc) (ref 1.0–2.5)
ALBUMIN MSPROF: 3.9 g/dL (ref 3.6–5.1)
ALKALINE PHOSPHATASE (APISO): 63 U/L (ref 33–130)
ALT: 12 U/L (ref 6–29)
AST: 18 U/L (ref 10–35)
BUN / CREAT RATIO: 15 (calc) (ref 6–22)
BUN: 15 mg/dL (ref 7–25)
CO2: 29 mmol/L (ref 20–32)
Calcium: 8.8 mg/dL (ref 8.6–10.4)
Chloride: 103 mmol/L (ref 98–110)
Creat: 1 mg/dL — ABNORMAL HIGH (ref 0.50–0.99)
GFR, Est African American: 68 mL/min/{1.73_m2} (ref >=60)
GFR, Est Non African American: 58 mL/min/{1.73_m2} — ABNORMAL LOW (ref >=60)
GLOBULIN: 2.9 g/dL (ref 1.9–3.7)
Glucose, Bld: 77 mg/dL (ref 65–139)
POTASSIUM: 4.1 mmol/L (ref 3.5–5.3)
SODIUM: 138 mmol/L (ref 135–146)
Total Bilirubin: 0.6 mg/dL (ref 0.2–1.2)
Total Protein: 6.8 g/dL (ref 6.1–8.1)

## 2017-12-16 LAB — TSH: TSH: 1.24 m[IU]/L (ref 0.40–4.50)

## 2017-12-16 MED ORDER — ESTRADIOL 0.5 MG PO TABS: 0.5000 mg | ORAL_TABLET | Freq: Every day | ORAL | 1 refills | Status: DC

## 2017-12-16 MED ORDER — ATORVASTATIN CALCIUM 20 MG PO TABS: 20.0000 mg | ORAL_TABLET | Freq: Every day | ORAL | 1 refills | Status: DC

## 2017-12-16 MED ORDER — TRAZODONE HCL 50 MG PO TABS: 25.0000 mg | ORAL_TABLET | Freq: Every evening | ORAL | 1 refills | Status: DC | PRN

## 2017-12-16 MED ORDER — DULOXETINE HCL 60 MG PO CPEP: 60.0000 mg | ORAL_CAPSULE | Freq: Every day | ORAL | 1 refills | Status: DC

## 2017-12-16 MED ORDER — MELOXICAM 7.5 MG PO TABS: 7.5000 mg | ORAL_TABLET | Freq: Every day | ORAL | 1 refills | Status: DC

## 2017-12-16 MED ORDER — LEVOTHYROXINE SODIUM 50 MCG PO TABS: 50.0000 ug | ORAL_TABLET | Freq: Every day | ORAL | 0 refills | Status: DC

## 2017-12-16 NOTE — Progress Notes (Signed)
Name: Lauren Johnston   MRN: [id number]    DOB: 1951/02/24   Date:12/16/2017       Progress Note  Subjective  Chief Complaint  Chief Complaint  Patient presents with  . Insomnia  . Depression  . Hyperlipidemia  . Knee Pain    HPI  Major Depression recurrent: she has been taking Cymbalta for years, helps with pain but also mood. She denies crying spells, states occasionally has lack of motivation, but feels good most of the time. Excited about Spring. She got married in 2018   Knee osteoarthritis: seen by Dr. Harlow Mares, had steroid injection and given Meloxicam, she states that she has been taking Ibuprofen pm, advised to stop that and we will try lower dose of Meloxicam 7.5 mg with tylenol, try to avoid otc nsaid's  CKI stage III: she has decreased GFR but stable over the past 18 months, denies pruritus, or decrease in urinary output, no nausea. Feeling well, but explained importance of trying to decrease nsaid's intake.   Obesity: she lost 4 lbs by eating from 11 am to 7 pm, healthier choices and more active since last visit.   Dyslipidemia: taking atorvastatin, denies muscle ache, chest pain or palpitation  Primary insomnia; taking trazodone 25-50 mg qhs and states helps her fall and stay asleep  Hypothyroidism and multinodular goiter: seeing endocrinologist, but asked me to give her medications, we will recheck labs, no dysphagia, she has alopecia ( but no changes), she has a long history of constipation  ( unchanged) takes miralax prn.    Patient Active Problem List   Diagnosis Date Noted  . Class 1 obesity in adult 11/11/2016  . Osteopenia 10/09/2016  . Special screening for malignant neoplasms, colon   . Rectal polyp   . Right wrist tendonitis 05/16/2016  . Osteoarthritis of left hip 05/16/2016  . Vitamin D deficiency 02/05/2016  . Cold sore 02/05/2016  . Degeneration of intervertebral disc of cervical region 02/05/2016  . Insomnia 04/23/2015  . Major depression in  remission ([place]) 04/23/2015  . Chronic pain 04/23/2015  . Goiter, nontoxic, 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  . 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 Uterine leiomyoma 06/16/2007    Past Surgical History:  Procedure Laterality Date  . ABDOMINAL HYSTERECTOMY    . BILATERAL OOPHORECTOMY    . COLONOSCOPY WITH PROPOFOL N/A 06/02/2016   Procedure: COLONOSCOPY WITH PROPOFOL;  Surgeon: Lucilla Lame, MD;  Location: [facility];  Service: Endoscopy;  Laterality: N/A;  . dequavian tendonitis    . KNEE ARTHROSCOPY    . POLYPECTOMY  06/02/2016   Procedure: POLYPECTOMY;  Surgeon: Lucilla Lame, MD;  Location: [facility];  Service: Endoscopy;;    Family History  Problem Relation Age of Onset  . Diabetes Mother   . Heart disease Mother   . Emphysema Father   . Heart disease Father   . Multiple myeloma Brother   . Alzheimer's disease Sister     Social History   Socioeconomic History  . Marital status: Married    Spouse name: Not on file  . Number of children: 3  . Years of education: Not on file  . Highest education level: Not on file  Occupational History  . Not on file  Social Needs  . Financial resource strain: Not hard at all  . Food insecurity:    Worry: Never true    Inability: Never true  . Transportation needs:    Medical: No    Non-medical: No  Tobacco Use  .  Smoking status: Never Smoker  . Smokeless tobacco: Never Used  Substance and Sexual Activity  . Alcohol use: No    Alcohol/week: 0.0 oz  . Drug use: No  . Sexual activity: Yes    Partners: Male    Birth control/protection: Post-menopausal  Lifestyle  . Physical activity:    Days per week: 0 days    Minutes per session: 0 min  . Stress: Only a little  Relationships  . Social connections:    Talks on phone: More than three times a week    Gets together: Once a week    Attends religious service: More than 4 times per year    Active member of club or organization: Yes    Attends meetings of clubs  or organizations: More than 4 times per year    Relationship status: Widowed  . Intimate partner violence:    Fear of current or ex partner: No    Emotionally abused: No    Physically abused: No    Forced sexual activity: No  Other Topics Concern  . Not on file  Social History Narrative   Widow, but remarried Dec 9th 2018     Current Outpatient Medications:  .  aspirin 81 MG tablet, Take 81 mg by mouth daily., Disp: , Rfl:  .  atorvastatin (LIPITOR) 20 MG tablet, Take 1 tablet (20 mg total) by mouth daily., Disp: 90 tablet, Rfl: 1 .  Calcium Carb-Cholecalciferol (CALCIUM 1000 + D PO), Take by mouth., Disp: , Rfl:  .  DULoxetine (CYMBALTA) 60 MG capsule, Take 1 capsule (60 mg total) by mouth daily., Disp: 90 capsule, Rfl: 1 .  estradiol (ESTRACE) 0.5 MG tablet, Take 1 tablet (0.5 mg total) by mouth daily., Disp: 90 tablet, Rfl: 1 .  levothyroxine (SYNTHROID, LEVOTHROID) 50 MCG tablet, Take 1 tablet (50 mcg total) by mouth daily before breakfast., Disp: 90 tablet, Rfl: 0 .  meloxicam (MOBIC) 7.5 MG tablet, Take 1 tablet (7.5 mg total) by mouth daily., Disp: 90 tablet, Rfl: 1 .  traZODone (DESYREL) 50 MG tablet, Take 0.5-1 tablets (25-50 mg total) by mouth at bedtime as needed for sleep., Disp: 90 tablet, Rfl: 1  Allergies  Allergen Reactions  . Sulfamethoxazole-Trimethoprim Other (See Comments) and Rash  . Citalopram   . Tetracycline Other (See Comments)    Other Reaction: URTICARIA  . Tetracyclines & Related Itching  . Codeine Itching     ROS  Constitutional: Negative for fever or significant  weight change.  Respiratory: Negative for cough and shortness of breath.   Cardiovascular: Negative for chest pain or palpitations.  Gastrointestinal: Negative for abdominal pain, no bowel changes.  Musculoskeletal: Negative for gait problem or joint swelling.  Skin: Negative for rash.  Neurological: Negative for dizziness or headache.  No other specific complaints in a complete  review of systems (except as listed in HPI above).  Objective  Vitals:   12/16/17 0941  BP: 120/70  Pulse: 70  Resp: 16  SpO2: 98%  Weight: 199 lb (90.3 kg)  Height: '5\' 7"'  (1.702 m)    Body mass index is 31.17 kg/m.  Physical Exam  Constitutional: Patient appears well-developed and well-nourished. Obese  No distress.  HEENT: head atraumatic, normocephalic, pupils equal and reactive to light,  neck supple, throat within normal limits, thyroid gland palpable more on the left side Cardiovascular: Normal rate, regular rhythm and normal heart sounds.  No murmur heard. No BLE edema. Pulmonary/Chest: Effort normal and breath sounds normal. No respiratory  distress. Abdominal: Soft.  There is no tenderness. Psychiatric: Patient has a normal mood and affect. behavior is normal. Judgment and thought content normal.  PHQ2/9: Depression screen [facility] 2/9 07/17/2017 05/14/2017 11/11/2016 07/09/2016 05/07/2016  Decreased Interest 0 0 0 0 0  Down, Depressed, Hopeless 0 0 0 0 0  PHQ - 2 Score 0 0 0 0 0     Fall Risk: Fall Risk  12/16/2017 07/17/2017 05/14/2017 05/06/2017 11/11/2016  Falls in the past year? No No No No No     Functional Status Survey: Is the patient deaf or have difficulty hearing?: No Does the patient have difficulty seeing, even when wearing glasses/contacts?: No Does the patient have difficulty concentrating, remembering, or making decisions?: No Does the patient have difficulty walking or climbing stairs?: No Does the patient have difficulty dressing or bathing?: No Does the patient have difficulty doing errands alone such as visiting a doctor's office or shopping?: No   Assessment & Plan  1. Moderate recurrent major depression (HCC)  - DULoxetine (CYMBALTA) 60 MG capsule; Take 1 capsule (60 mg total) by mouth daily.  Dispense: 90 capsule; Refill: 1  2. Pure hypercholesterolemia  - atorvastatin (LIPITOR) 20 MG tablet; Take 1 tablet (20 mg total) by mouth daily.   Dispense: 90 tablet; Refill: 1  3. Menopause  - estradiol (ESTRACE) 0.5 MG tablet; Take 1 tablet (0.5 mg total) by mouth daily.  Dispense: 90 tablet; Refill: 1  4. Primary insomnia  - traZODone (DESYREL) 50 MG tablet; Take 0.5-1 tablets (25-50 mg total) by mouth at bedtime as needed for sleep.  Dispense: 90 tablet; Refill: 1  5. Hypothyroidism, postablative  - levothyroxine (SYNTHROID, LEVOTHROID) 50 MCG tablet; Take 1 tablet (50 mcg total) by mouth daily before breakfast.  Dispense: 90 tablet; Refill: 0 - TSH  6. Stage 3 chronic kidney disease ([place])  Advised to not take any other nsaid's otc, go down on meloxicam to 7.5 and try taking it prn only, add tylenol up to 4 times daily.  - COMPLETE METABOLIC PANEL WITH GFR  7. Multinodular goiter  Continue follow up with endocrinologist  8. Obesity (BMI 30.0-34.9)  Discussed with the patient the risk posed by an increased BMI. Discussed importance of portion control, calorie counting and at least 150 minutes of physical activity weekly. Avoid sweet beverages and drink more water. Eat at least 6 servings of fruit and vegetables daily   9. Primary osteoarthritis of both knees  - meloxicam (MOBIC) 7.5 MG tablet; Take 1 tablet (7.5 mg total) by mouth daily.  Dispense: 90 tablet; Refill: 1

## 2017-12-17 ENCOUNTER — Other Ambulatory Visit: Payer: Self-pay | Admitting: Family Medicine

## 2017-12-28 ENCOUNTER — Other Ambulatory Visit: Payer: Self-pay

## 2017-12-28 ENCOUNTER — Telehealth: Payer: Self-pay | Admitting: Family Medicine

## 2017-12-28 DIAGNOSIS — J209 Acute bronchitis, unspecified: Secondary | ICD-10-CM

## 2017-12-28 DIAGNOSIS — E890 Postprocedural hypothyroidism: Secondary | ICD-10-CM

## 2017-12-28 MED ORDER — LEVOTHYROXINE SODIUM 50 MCG PO TABS: 50.0000 ug | ORAL_TABLET | Freq: Every day | ORAL | 0 refills | Status: DC

## 2017-12-28 NOTE — Telephone Encounter (Signed)
Refill request for general medication: Benzonatate 100 mg  Last office visit: 12/16/2017  Last physical exam: 07/17/2017  Follow-ups on file. 08/02/2018

## 2017-12-28 NOTE — Telephone Encounter (Signed)
Refill request for thyroid medication: Synthroid 50 mcg  Last Physical: 07/17/2017  Lab Results  Component Value Date   TSH 1.24 12/16/2017    Follow-ups on file. 08/02/2018

## 2017-12-28 NOTE — Telephone Encounter (Signed)
Copied from [place] [id number]. Topic: Inquiry >> Dec 28, 2017  9:51 AM Pricilla Handler wrote: Reason for CRM: Patient needs a refill of levothyroxine (SYNTHROID, LEVOTHROID) 50 MCG tablet sent to her local pharmacy as she is almost out of her medication. Patient called her mail order pharmacy, but they stated it will be about two weeks before medication would arrive to patient. Patient's preferred mail order pharmacy is [place], [place] - [address] [phone] (Phone)  [phone] (Fax).       Thank You!!!

## 2017-12-28 NOTE — Telephone Encounter (Signed)
Copied from [place] [id number]. Topic: Inquiry >> Dec 28, 2017  9:58 AM Pricilla Handler wrote: Reason for CRM: Patient has requested if Dr. Ancil Boozer would prescribe her Benzonatate for her coughing. Patient has taken it before and would like it again due to her coughing.       Thank You!!!

## 2018-02-09 DIAGNOSIS — E890 Postprocedural hypothyroidism: Secondary | ICD-10-CM | POA: Diagnosis not present

## 2018-02-09 DIAGNOSIS — E042 Nontoxic multinodular goiter: Secondary | ICD-10-CM | POA: Diagnosis not present

## 2018-03-02 ENCOUNTER — Ambulatory Visit: Payer: Medicare Other | Admitting: Nurse Practitioner

## 2018-04-29 ENCOUNTER — Encounter: Payer: Self-pay | Admitting: Family Medicine

## 2018-04-29 ENCOUNTER — Ambulatory Visit (INDEPENDENT_AMBULATORY_CARE_PROVIDER_SITE_OTHER): Payer: Medicare Other | Admitting: Family Medicine

## 2018-04-29 VITALS — BP 116/68 | HR 83 | Temp 97.8°F | Resp 18 | Ht 67.0 in | Wt 191.1 lb

## 2018-04-29 DIAGNOSIS — R05 Cough: Secondary | ICD-10-CM

## 2018-04-29 DIAGNOSIS — R0789 Other chest pain: Secondary | ICD-10-CM | POA: Diagnosis not present

## 2018-04-29 DIAGNOSIS — R053 Chronic cough: Secondary | ICD-10-CM

## 2018-04-29 MED ORDER — MONTELUKAST SODIUM 10 MG PO TABS: 10.0000 mg | ORAL_TABLET | Freq: Every day | ORAL | 0 refills | Status: DC

## 2018-04-29 MED ORDER — BENZONATATE 100 MG PO CAPS: 100.0000 mg | ORAL_CAPSULE | Freq: Three times a day (TID) | ORAL | 0 refills | Status: DC | PRN

## 2018-04-29 MED ORDER — BACLOFEN 10 MG PO TABS: 10.0000 mg | ORAL_TABLET | Freq: Three times a day (TID) | ORAL | 0 refills | Status: DC

## 2018-04-29 MED ORDER — LORATADINE 10 MG PO TABS: 10.0000 mg | ORAL_TABLET | Freq: Every day | ORAL | 0 refills | Status: DC

## 2018-04-29 NOTE — Progress Notes (Signed)
Name: Lauren Johnston   MRN: [id number]    DOB: [birth year]   Date:04/29/2018       Progress Note  Subjective  Chief Complaint  Chief Complaint  Patient presents with  . Cough    Will cough at night, has a tickle in her throat and couging up phlegm   . Back Pain    Onset-3 months, on right side.    HPI  Cough she has a history of bronchitis, last treated one year ago. She states that for the past few months she has noticed phlegm on the back of her throat only at night that she needs to cough it up. No fever or chills, no nausea, vomiting or change of appetite. She lost 8 lbs, but states she has been doing intermittent fasting and has been more active lately. No wheezing, SOB or decrease in exercise tolerance. No recent travel, no contact with anyone with TB. She does not recall having cold symptoms prior to episode. She has been raising the head of her bed at night and over the past 2 months she has noticed right flank pain only at night. She does not recall pulling a muscle, no pain during the day. She states she did not come in sooner because Tessalon perles was controlling symptoms but ran out of medication. No facial pressure, pain or post-nasal drainage.    Patient Active Problem List   Diagnosis Date Noted  . Class 1 obesity in adult 11/11/2016  . Osteopenia 10/09/2016  . Special screening for malignant neoplasms, colon   . Rectal polyp   . Right wrist tendonitis 05/16/2016  . Osteoarthritis of left hip 05/16/2016  . Vitamin D deficiency 02/05/2016  . Cold sore 02/05/2016  . Degeneration of intervertebral disc of cervical region 02/05/2016  . Insomnia 04/23/2015  . Major depression in remission ([place]) 04/23/2015  . Chronic pain 04/23/2015  . Goiter, nontoxic, 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  . 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 Uterine leiomyoma 06/16/2007    Past Surgical History:  Procedure Laterality Date  . ABDOMINAL HYSTERECTOMY    . BILATERAL OOPHORECTOMY    .  COLONOSCOPY WITH PROPOFOL N/A 06/02/2016   Procedure: COLONOSCOPY WITH PROPOFOL;  Surgeon: Lucilla Lame, MD;  Location: [place] [facility];  Service: Endoscopy;  Laterality: N/A;  . dequavian tendonitis    . KNEE ARTHROSCOPY    . POLYPECTOMY  06/02/2016   Procedure: POLYPECTOMY;  Surgeon: Lucilla Lame, MD;  Location: [place];  Service: Endoscopy;;    Family History  Problem Relation Age of Onset  . Diabetes Mother   . Heart disease Mother   . Emphysema Father   . Heart disease Father   . Multiple myeloma Brother   . Alzheimer's disease Sister     Social History   Socioeconomic History  . Marital status: Married    Spouse name: Not on file  . Number of children: 3  . Years of education: Not on file  . Highest education level: Not on file  Occupational History  . Not on file  Social Needs  . Financial resource strain: Not hard at all  . Food insecurity:    Worry: Never true    Inability: Never true  . Transportation needs:    Medical: No    Non-medical: No  Tobacco Use  . Smoking status: Never Smoker  . Smokeless tobacco: Never Used  Substance and Sexual Activity  . Alcohol use: No    Alcohol/week: 0.0 standard drinks  . Drug use:  No  . Sexual activity: Yes    Partners: Male    Birth control/protection: Post-menopausal  Lifestyle  . Physical activity:    Days per week: 0 days    Minutes per session: 0 min  . Stress: Only a little  Relationships  . Social connections:    Talks on phone: More than three times a week    Gets together: Once a week    Attends religious service: More than 4 times per year    Active member of club or organization: Yes    Attends meetings of clubs or organizations: More than 4 times per year    Relationship status: Widowed  . Intimate partner violence:    Fear of current or ex partner: No    Emotionally abused: No    Physically abused: No    Forced sexual activity: No  Other Topics Concern  . Not on file  Social  History Narrative   Widow, but remarried Dec 9th 2018     Current Outpatient Medications:  .  aspirin 81 MG tablet, Take 81 mg by mouth daily., Disp: , Rfl:  .  atorvastatin (LIPITOR) 20 MG tablet, Take 1 tablet (20 mg total) by mouth daily., Disp: 90 tablet, Rfl: 1 .  Calcium Carb-Cholecalciferol (CALCIUM 1000 + D PO), Take by mouth., Disp: , Rfl:  .  DULoxetine (CYMBALTA) 60 MG capsule, Take 1 capsule (60 mg total) by mouth daily., Disp: 90 capsule, Rfl: 1 .  estradiol (ESTRACE) 0.5 MG tablet, Take 1 tablet (0.5 mg total) by mouth daily., Disp: 90 tablet, Rfl: 1 .  levothyroxine (SYNTHROID, LEVOTHROID) 50 MCG tablet, Take 1 tablet (50 mcg total) by mouth daily before breakfast., Disp: 90 tablet, Rfl: 0 .  traZODone (DESYREL) 50 MG tablet, Take 0.5-1 tablets (25-50 mg total) by mouth at bedtime as needed for sleep., Disp: 90 tablet, Rfl: 1 .  meloxicam (MOBIC) 7.5 MG tablet, Take 1 tablet (7.5 mg total) by mouth daily. (Patient not taking: Reported on 04/29/2018), Disp: 90 tablet, Rfl: 1  Allergies  Allergen Reactions  . Sulfamethoxazole-Trimethoprim Other (See Comments) and Rash  . Citalopram   . Tetracycline Other (See Comments)    Other Reaction: URTICARIA  . Tetracyclines & Related Itching  . Codeine Itching     ROS  Constitutional: Negative for fever, positive for weight change.  Respiratory: positive  for cough but no  shortness of breath.   Cardiovascular: Negative for chest pain or palpitations.  Gastrointestinal: positive for right flank pain,  no bowel changes.  Musculoskeletal: Negative for gait problem or joint swelling.  Skin: Negative for rash.  Neurological: Negative for dizziness or headache.  No other specific complaints in a complete review of systems (except as listed in HPI above).  Objective  Vitals:   04/29/18 1049  BP: 116/68  Pulse: 83  Resp: 18  Temp: 97.8 F (36.6 C)  TempSrc: Oral  SpO2: 98%  Weight: 191 lb 1.6 oz (86.7 kg)  Height: '5\' 7"'   (1.702 m)    Body mass index is 29.93 kg/m.  Physical Exam  Constitutional: Patient appears well-developed and well-nourished. Overweight.  No distress.  HEENT: head atraumatic, normocephalic, pupils equal and reactive to light,neck supple, throat within normal limits Cardiovascular: Normal rate, regular rhythm and normal heart sounds.  No murmur heard. No BLE edema. Pulmonary/Chest: Effort normal and breath sounds normal. No respiratory distress. Abdominal: Soft.  There is no tenderness. Pain during palpation of right posterior chest, negative CVA tenderness. Seems  muscular  Psychiatric: Patient has a normal mood and affect. behavior is normal. Judgment and thought content normal.  PHQ2/9: Depression screen [facility] 2/9 04/29/2018 12/16/2017 07/17/2017 05/14/2017 11/11/2016  Decreased Interest 0 0 0 0 0  Down, Depressed, Hopeless 0 0 0 0 0  PHQ - 2 Score 0 0 0 0 0  Altered sleeping 1 1 - - -  Tired, decreased energy 1 1 - - -  Change in appetite 0 0 - - -  Feeling bad or failure about yourself  0 0 - - -  Trouble concentrating 0 0 - - -  Moving slowly or fidgety/restless 0 0 - - -  Suicidal thoughts 0 0 - - -  PHQ-9 Score 2 2 - - -  Difficult doing work/chores Not difficult at all Not difficult at all - - -     Fall Risk: Fall Risk  04/29/2018 12/16/2017 07/17/2017 05/14/2017 05/06/2017  Falls in the past year? No No No No No    Functional Status Survey: Is the patient deaf or have difficulty hearing?: No Does the patient have difficulty seeing, even when wearing glasses/contacts?: Yes(glasses) Does the patient have difficulty concentrating, remembering, or making decisions?: No Does the patient have difficulty walking or climbing stairs?: No Does the patient have difficulty dressing or bathing?: No Does the patient have difficulty doing errands alone such as visiting a doctor's office or shopping?: No    Assessment & Plan  1. Chronic cough  She also needs spirometry but does  not have time today, she will return for spirometry  - DG Chest 2 View; Future - benzonatate (TESSALON) 100 MG capsule; Take 1-2 capsules (100-200 mg total) by mouth 3 (three) times daily as needed.  Dispense: 40 capsule; Refill: 0 - DG Chest 2 View - loratadine (CLARITIN) 10 MG tablet; Take 1 tablet (10 mg total) by mouth daily.  Dispense: 30 tablet; Refill: 0 - montelukast (SINGULAIR) 10 MG tablet; Take 1 tablet (10 mg total) by mouth at bedtime.  Dispense: 30 tablet; Refill: 0  2. Right-sided chest wall pain  - baclofen (LIORESAL) 10 MG tablet; Take 1 tablet (10 mg total) by mouth 3 (three) times daily.  Dispense: 30 each; Refill: 0

## 2018-05-04 ENCOUNTER — Ambulatory Visit
Admission: RE | Admit: 2018-05-04 | Discharge: 2018-05-04 | Disposition: A | Discharge status: Home / Self Care | Payer: Medicare Other | Source: Ambulatory Visit | Attending: Family Medicine | Admitting: Family Medicine

## 2018-05-04 DIAGNOSIS — J398 Other specified diseases of upper respiratory tract: Secondary | ICD-10-CM | POA: Insufficient documentation

## 2018-05-04 DIAGNOSIS — R05 Cough: Secondary | ICD-10-CM | POA: Diagnosis not present

## 2018-05-07 ENCOUNTER — Other Ambulatory Visit: Payer: Self-pay

## 2018-05-07 DIAGNOSIS — R053 Chronic cough: Secondary | ICD-10-CM

## 2018-05-07 DIAGNOSIS — R05 Cough: Secondary | ICD-10-CM

## 2018-05-07 MED ORDER — LORATADINE 10 MG PO TABS: 10.0000 mg | ORAL_TABLET | Freq: Every day | ORAL | 0 refills | Status: DC

## 2018-05-07 NOTE — Telephone Encounter (Signed)
Refill request for general medication. Loratadine to [facility]  Last office visit 04/29/18  Follow up on 06/02/2018

## 2018-05-20 DIAGNOSIS — E042 Nontoxic multinodular goiter: Secondary | ICD-10-CM | POA: Diagnosis not present

## 2018-06-02 ENCOUNTER — Encounter: Payer: Self-pay | Admitting: Family Medicine

## 2018-06-02 ENCOUNTER — Ambulatory Visit (INDEPENDENT_AMBULATORY_CARE_PROVIDER_SITE_OTHER): Payer: Medicare Other | Admitting: Family Medicine

## 2018-06-02 VITALS — BP 106/70 | HR 77 | Temp 97.5°F | Resp 16 | Ht 67.0 in | Wt 193.1 lb

## 2018-06-02 DIAGNOSIS — R053 Chronic cough: Secondary | ICD-10-CM

## 2018-06-02 DIAGNOSIS — Z780 Asymptomatic menopausal state: Secondary | ICD-10-CM

## 2018-06-02 DIAGNOSIS — N183 Chronic kidney disease, stage 3 unspecified: Secondary | ICD-10-CM

## 2018-06-02 DIAGNOSIS — Z23 Encounter for immunization: Secondary | ICD-10-CM | POA: Diagnosis not present

## 2018-06-02 DIAGNOSIS — E7800 Pure hypercholesterolemia, unspecified: Secondary | ICD-10-CM

## 2018-06-02 DIAGNOSIS — E890 Postprocedural hypothyroidism: Secondary | ICD-10-CM

## 2018-06-02 DIAGNOSIS — Z79899 Other long term (current) drug therapy: Secondary | ICD-10-CM

## 2018-06-02 DIAGNOSIS — R05 Cough: Secondary | ICD-10-CM

## 2018-06-02 DIAGNOSIS — E042 Nontoxic multinodular goiter: Secondary | ICD-10-CM

## 2018-06-02 DIAGNOSIS — M170 Bilateral primary osteoarthritis of knee: Secondary | ICD-10-CM

## 2018-06-02 DIAGNOSIS — F325 Major depressive disorder, single episode, in full remission: Secondary | ICD-10-CM

## 2018-06-02 DIAGNOSIS — F5101 Primary insomnia: Secondary | ICD-10-CM

## 2018-06-02 DIAGNOSIS — E669 Obesity, unspecified: Secondary | ICD-10-CM

## 2018-06-02 MED ORDER — OMEPRAZOLE 40 MG PO CPDR: 40.0000 mg | DELAYED_RELEASE_CAPSULE | Freq: Every day | ORAL | 2 refills | Status: DC

## 2018-06-02 MED ORDER — ESTRADIOL 0.5 MG PO TABS: 0.5000 mg | ORAL_TABLET | Freq: Every day | ORAL | 1 refills | Status: DC

## 2018-06-02 MED ORDER — DULOXETINE HCL 60 MG PO CPEP: 60.0000 mg | ORAL_CAPSULE | Freq: Every day | ORAL | 1 refills | Status: DC

## 2018-06-02 MED ORDER — ATORVASTATIN CALCIUM 20 MG PO TABS: 20.0000 mg | ORAL_TABLET | Freq: Every day | ORAL | 1 refills | Status: DC

## 2018-06-02 MED ORDER — LORATADINE 10 MG PO TABS: 10.0000 mg | ORAL_TABLET | Freq: Every day | ORAL | 1 refills | Status: DC

## 2018-06-02 MED ORDER — BENZONATATE 100 MG PO CAPS: 100.0000 mg | ORAL_CAPSULE | Freq: Three times a day (TID) | ORAL | 0 refills | Status: DC | PRN

## 2018-06-02 MED ORDER — MONTELUKAST SODIUM 10 MG PO TABS: 10.0000 mg | ORAL_TABLET | Freq: Every day | ORAL | 1 refills | Status: DC

## 2018-06-02 MED ORDER — TRAZODONE HCL 50 MG PO TABS: 25.0000 mg | ORAL_TABLET | Freq: Every evening | ORAL | 1 refills | Status: DC | PRN

## 2018-06-02 NOTE — Progress Notes (Signed)
Name: Lauren Johnston   MRN: [id number]    DOB: 12/28/1950   Date:06/02/2018       Progress Note  Subjective  Chief Complaint  Chief Complaint  Patient presents with  . Follow-up  . Cough    patient stated that the cough persists and that it happens mainly at night. still productive, white phlegm.    HPI  Cough she has a history of bronchitis, last treated in 2018. She states that for the past 5 months  she has noticed phlegm on the back of her throat mostly  at night that she needs to cough it up, it is white in color. No fever or chills, no nausea, vomiting or change of appetite. She had lost 8 lbs but is up 2 lbs today. No wheezing, SOB or decrease in exercise tolerance. No recent travel, no contact with anyone with TB. She does not recall having cold symptoms prior to episode.She was seen in August , had CXR in 05/2018 that was negative for acute disease. She states tessalon perles helps with symptoms, she is not very consistent taking loratadine or singulair. She denies heartburn, but explained that since spirometry was normal we will try treating for GERD  Major Depression recurrent: she has been taking Cymbalta for many years, helps with pain but also mood. She denies crying spells, states occasionally has lack of motivation, but feels good most of the time. She got married in 2018 . She is in remission   Knee osteoarthritis: seen by Dr. Harlow Mares, had steroid injection and given Meloxicam, she states that she has been taking Ibuprofen pm, advised to stop that and we will try lower dose of Meloxicam 7.5 mg and is now taking it prn only   CKI stage III: we will recheck labs, good urine output, reminded her to avoid nsaid's  Obesity: weight is stable.   Dyslipidemia: taking atorvastatin, denies muscle ache, chest pain or palpitation. We will recheck labs  Primary insomnia; taking trazodone 25-50 mg qhs and states helps her fall and stay asleep . Unchanged   Hypothyroidism and  multinodular goiter: seeing endocrinologist,last TSH was normal, she had an US thyroid done recently and is going back for follow up soon. Weight is stable, no dysphagia.     Patient Active Problem List   Diagnosis Date Noted  . Class 1 obesity in adult 11/11/2016  . Osteopenia 10/09/2016  . Special screening for malignant neoplasms, colon   . Rectal polyp   . Right wrist tendonitis 05/16/2016  . Osteoarthritis of left hip 05/16/2016  . Vitamin D deficiency 02/05/2016  . Cold sore 02/05/2016  . Degeneration of intervertebral disc of cervical region 02/05/2016  . Insomnia 04/23/2015  . Major depression in remission ([place]) 04/23/2015  . Chronic pain 04/23/2015  . Goiter, nontoxic, 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  . 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 Uterine leiomyoma 06/16/2007    Past Surgical History:  Procedure Laterality Date  . ABDOMINAL HYSTERECTOMY    . BILATERAL OOPHORECTOMY    . COLONOSCOPY WITH PROPOFOL N/A 06/02/2016   Procedure: COLONOSCOPY WITH PROPOFOL;  Surgeon: Lucilla Lame, MD;  Location: [facility];  Service: Endoscopy;  Laterality: N/A;  . dequavian tendonitis    . KNEE ARTHROSCOPY    . POLYPECTOMY  06/02/2016   Procedure: POLYPECTOMY;  Surgeon: Lucilla Lame, MD;  Location: [facility];  Service: Endoscopy;;    Family History  Problem Relation Age of Onset  . Diabetes Mother   . Heart disease Mother   .  Emphysema Father   . Heart disease Father   . Multiple myeloma Brother   . Alzheimer's disease Sister     Social History   Socioeconomic History  . Marital status: Married    Spouse name: Not on file  . Number of children: 3  . Years of education: Not on file  . Highest education level: Not on file  Occupational History  . Not on file  Social Needs  . Financial resource strain: Not hard at all  . Food insecurity:    Worry: Never true    Inability: Never true  . Transportation needs:    Medical: No    Non-medical: No  Tobacco  Use  . Smoking status: Never Smoker  . Smokeless tobacco: Never Used  Substance and Sexual Activity  . Alcohol use: No    Alcohol/week: 0.0 standard drinks  . Drug use: No  . Sexual activity: Yes    Partners: Male    Birth control/protection: Post-menopausal  Lifestyle  . Physical activity:    Days per week: 0 days    Minutes per session: 0 min  . Stress: Only a little  Relationships  . Social connections:    Talks on phone: More than three times a week    Gets together: Once a week    Attends religious service: More than 4 times per year    Active member of club or organization: Yes    Attends meetings of clubs or organizations: More than 4 times per year    Relationship status: Widowed  . Intimate partner violence:    Fear of current or ex partner: No    Emotionally abused: No    Physically abused: No    Forced sexual activity: No  Other Topics Concern  . Not on file  Social History Narrative   Widow, but remarried Dec 9th 2018     Current Outpatient Medications:  .  aspirin 81 MG tablet, Take 81 mg by mouth daily., Disp: , Rfl:  .  atorvastatin (LIPITOR) 20 MG tablet, Take 1 tablet (20 mg total) by mouth daily., Disp: 90 tablet, Rfl: 1 .  baclofen (LIORESAL) 10 MG tablet, Take 1 tablet (10 mg total) by mouth 3 (three) times daily., Disp: 30 each, Rfl: 0 .  benzonatate (TESSALON) 100 MG capsule, Take 1-2 capsules (100-200 mg total) by mouth 3 (three) times daily as needed., Disp: 40 capsule, Rfl: 0 .  Calcium Carb-Cholecalciferol (CALCIUM 1000 + D PO), Take by mouth., Disp: , Rfl:  .  DULoxetine (CYMBALTA) 60 MG capsule, Take 1 capsule (60 mg total) by mouth daily., Disp: 90 capsule, Rfl: 1 .  estradiol (ESTRACE) 0.5 MG tablet, Take 1 tablet (0.5 mg total) by mouth daily., Disp: 90 tablet, Rfl: 1 .  levothyroxine (SYNTHROID, LEVOTHROID) 50 MCG tablet, Take 1 tablet (50 mcg total) by mouth daily before breakfast., Disp: 90 tablet, Rfl: 0 .  loratadine (CLARITIN) 10 MG  tablet, Take 1 tablet (10 mg total) by mouth daily., Disp: 30 tablet, Rfl: 0 .  meloxicam (MOBIC) 7.5 MG tablet, Take 1 tablet (7.5 mg total) by mouth daily. (Patient not taking: Reported on 04/29/2018), Disp: 90 tablet, Rfl: 1 .  montelukast (SINGULAIR) 10 MG tablet, Take 1 tablet (10 mg total) by mouth at bedtime., Disp: 30 tablet, Rfl: 0 .  traZODone (DESYREL) 50 MG tablet, Take 0.5-1 tablets (25-50 mg total) by mouth at bedtime as needed for sleep., Disp: 90 tablet, Rfl: 1  Allergies  Allergen Reactions  .  Sulfamethoxazole-Trimethoprim Other (See Comments) and Rash  . Citalopram   . Tetracycline Other (See Comments)    Other Reaction: URTICARIA  . Tetracyclines & Related Itching  . Codeine Itching    I personally reviewed active problem list, medication list, allergies, family history, social history with the patient/caregiver today.   ROS  Constitutional: Negative for fever or weight change.  Respiratory: positive  for cough but no  shortness of breath.   Cardiovascular: Negative for chest pain or palpitations.  Gastrointestinal: Negative for abdominal pain, no bowel changes.  Musculoskeletal: Negative for gait problem or joint swelling.  Skin: Negative for rash.  Neurological: Negative for dizziness or headache.  No other specific complaints in a complete review of systems (except as listed in HPI above).  Objective  Vitals:   06/02/18 0949  BP: 106/70  Pulse: 77  Resp: 16  Temp: (!) 97.5 F (36.4 C)  TempSrc: Oral  SpO2: 99%  Weight: 193 lb 1.6 oz (87.6 kg)  Height: '5\' 7"'  (1.702 m)    Body mass index is 30.24 kg/m.  Physical Exam  Constitutional: Patient appears well-developed and well-nourished. Obese No distress.  HEENT: head atraumatic, normocephalic, pupils equal and reactive to light, neck supple, throat within normal limits, thyromegaly  Cardiovascular: Normal rate, regular rhythm and normal heart sounds.  No murmur heard. No BLE edema. Pulmonary/Chest:  Effort normal and breath sounds normal. No respiratory distress. Abdominal: Soft.  There is no tenderness. Psychiatric: Patient has a normal mood and affect. behavior is normal. Judgment and thought content normal.  PHQ2/9: Depression screen [facility] 2/9 06/02/2018 04/29/2018 12/16/2017 07/17/2017 05/14/2017  Decreased Interest 0 0 0 0 0  Down, Depressed, Hopeless 0 0 0 0 0  PHQ - 2 Score 0 0 0 0 0  Altered sleeping '1 1 1 ' - -  Tired, decreased energy '1 1 1 ' - -  Change in appetite 0 0 0 - -  Feeling bad or failure about yourself  0 0 0 - -  Trouble concentrating 0 0 0 - -  Moving slowly or fidgety/restless 0 0 0 - -  Suicidal thoughts 0 0 0 - -  PHQ-9 Score '2 2 2 ' - -  Difficult doing work/chores Not difficult at all Not difficult at all Not difficult at all - -     Fall Risk: Fall Risk  06/02/2018 04/29/2018 12/16/2017 07/17/2017 05/14/2017  Falls in the past year? No No No No No     Functional Status Survey: Is the patient deaf or have difficulty hearing?: No Does the patient have difficulty seeing, even when wearing glasses/contacts?: Yes(glasses) Does the patient have difficulty concentrating, remembering, or making decisions?: No Does the patient have difficulty walking or climbing stairs?: No Does the patient have difficulty dressing or bathing?: No Does the patient have difficulty doing errands alone such as visiting a doctor's office or shopping?: No    Assessment & Plan  1. Chronic cough  - PR EVAL OF BRONCHOSPASM - omeprazole (PRILOSEC) 40 MG capsule; Take 1 capsule (40 mg total) by mouth daily. Before dinner  Dispense: 30 capsule; Refill: 2 - CBC with Differential/Platelet - benzonatate (TESSALON) 100 MG capsule; Take 1-2 capsules (100-200 mg total) by mouth 3 (three) times daily as needed.  Dispense: 40 capsule; Refill: 0 - montelukast (SINGULAIR) 10 MG tablet; Take 1 tablet (10 mg total) by mouth at bedtime.  Dispense: 90 tablet; Refill: 1 - loratadine (CLARITIN) 10 MG  tablet; Take 1 tablet (10 mg total) by mouth  daily.  Dispense: 90 tablet; Refill: 1  2. Need for influenza vaccination  - Flu vaccine HIGH DOSE PF  3. Major depression in remission  (HCC)  - COMPLETE METABOLIC PANEL WITH GFR - DULoxetine (CYMBALTA) 60 MG capsule; Take 1 capsule (60 mg total) by mouth daily.  Dispense: 90 capsule; Refill: 1  4. Hypothyroidism, postablative  Keep follow up with endocrinologist   5. Primary insomnia  - traZODone (DESYREL) 50 MG tablet; Take 0.5-1 tablets (25-50 mg total) by mouth at bedtime as needed for sleep.  Dispense: 90 tablet; Refill: 1  6. Pure hypercholesterolemia  - Lipid panel - atorvastatin (LIPITOR) 20 MG tablet; Take 1 tablet (20 mg total) by mouth daily.  Dispense: 90 tablet; Refill: 1  7. Multinodular goiter   8. Obesity (BMI 30.0-34.9)  - Hemoglobin A1c  9. Stage 3 chronic kidney disease (HCC)  Avoid nsaid's   10. Primary osteoarthritis of both knees   11. Long-term use of high-risk medication  - COMPLETE METABOLIC PANEL WITH GFR  12. Menopause  - estradiol (ESTRACE) 0.5 MG tablet; Take 1 tablet (0.5 mg total) by mouth daily.  Dispense: 90 tablet; Refill: 1

## 2018-06-03 MED ORDER — LORATADINE 10 MG PO TABS: 10.0000 mg | ORAL_TABLET | Freq: Every day | ORAL | 1 refills | Status: DC

## 2018-06-03 NOTE — Addendum Note (Signed)
Addended by: Docia Furl on: 06/03/2018 09:25 AM   Modules accepted: Orders

## 2018-06-11 ENCOUNTER — Other Ambulatory Visit: Payer: Self-pay | Admitting: Family Medicine

## 2018-06-11 DIAGNOSIS — Z1231 Encounter for screening mammogram for malignant neoplasm of breast: Secondary | ICD-10-CM

## 2018-07-20 ENCOUNTER — Other Ambulatory Visit: Payer: Self-pay

## 2018-07-20 ENCOUNTER — Ambulatory Visit (INDEPENDENT_AMBULATORY_CARE_PROVIDER_SITE_OTHER): Payer: Medicare Other

## 2018-07-20 VITALS — BP 120/76 | HR 83 | Temp 97.5°F | Resp 16 | Ht 67.0 in | Wt 195.1 lb

## 2018-07-20 DIAGNOSIS — R05 Cough: Secondary | ICD-10-CM

## 2018-07-20 DIAGNOSIS — Z0000 Encounter for general adult medical examination without abnormal findings: Secondary | ICD-10-CM | POA: Diagnosis not present

## 2018-07-20 DIAGNOSIS — R053 Chronic cough: Secondary | ICD-10-CM

## 2018-07-20 MED ORDER — BENZONATATE 100 MG PO CAPS: 100.0000 mg | ORAL_CAPSULE | Freq: Three times a day (TID) | ORAL | 0 refills | Status: DC | PRN

## 2018-07-20 NOTE — Patient Instructions (Signed)
Lauren Johnston , Thank you for taking time to come for your Medicare Wellness Visit. I appreciate your ongoing commitment to your health goals. Please review the following plan we discussed and let me know if I can assist you in the future.   Screening recommendations/referrals: Colonoscopy: done 06/02/16 Mammogram: done 08/27/17 - scheduled 08/30/18 Bone Density: done 10/09/16 Recommended yearly ophthalmology/optometry visit for glaucoma screening and checkup Recommended yearly dental visit for hygiene and checkup  Vaccinations: Influenza vaccine: done 06/02/18 Pneumococcal vaccine: done 07/17/17 Tdap vaccine: done 08/01/10 Shingles vaccine: zostavax done 04/07/11 Shingrix discussed, please contact your insurance company for coverage information.     Advanced directives: Please bring a copy of your health care power of attorney and living will to the office at your convenience.  Conditions/risks identified: Recommend increasing physical activity to 150 minutes per week  Next appointment: 08/02/18 10:20 Dr. Ancil Boozer    Preventive Care 68 Years and Older, Female Preventive care refers to lifestyle choices and visits with your health care provider that can promote health and wellness. What does preventive care include?  A yearly physical exam. This is also called an annual well check.  Dental exams once or twice a year.  Routine eye exams. Ask your health care provider how often you should have your eyes checked.  Personal lifestyle choices, including:  Daily care of your teeth and gums.  Regular physical activity.  Eating a healthy diet.  Avoiding tobacco and drug use.  Limiting alcohol use.  Practicing safe sex.  Taking low-dose aspirin every day.  Taking vitamin and mineral supplements as recommended by your health care provider. What happens during an annual well check? The services and screenings done by your health care provider during your annual well check will depend on  your age, overall health, lifestyle risk factors, and family history of disease. Counseling  Your health care provider may ask you questions about your:  Alcohol use.  Tobacco use.  Drug use.  Emotional well-being.  Home and relationship well-being.  Sexual activity.  Eating habits.  History of falls.  Memory and ability to understand (cognition).  Work and work Statistician.  Reproductive health. Screening  You may have the following tests or measurements:  Height, weight, and BMI.  Blood pressure.  Lipid and cholesterol levels. These may be checked every 5 years, or more frequently if you are over 65 years old.  Skin check.  Lung cancer screening. You may have this screening every year starting at age 68 if you have a 30-pack-year history of smoking and currently smoke or have quit within the past 15 years.  Fecal occult blood test (FOBT) of the stool. You may have this test every year starting at age 68.  Flexible sigmoidoscopy or colonoscopy. You may have a sigmoidoscopy every 5 years or a colonoscopy every 10 years starting at age 68.  Hepatitis C blood test.  Hepatitis B blood test.  Sexually transmitted disease (STD) testing.  Diabetes screening. This is done by checking your blood sugar (glucose) after you have not eaten for a while (fasting). You may have this done every 1-3 years.  Bone density scan. This is done to screen for osteoporosis. You may have this done starting at age 68.  Mammogram. This may be done every 1-2 years. Talk to your health care provider about how often you should have regular mammograms. Talk with your health care provider about your test results, treatment options, and if necessary, the need for more tests. Vaccines  Your health care provider may recommend certain vaccines, such as:  Influenza vaccine. This is recommended every year.  Tetanus, diphtheria, and acellular pertussis (Tdap, Td) vaccine. You may need a Td booster  every 10 years.  Zoster vaccine. You may need this after age 68.  Pneumococcal 13-valent conjugate (PCV13) vaccine. One dose is recommended after age 68.  Pneumococcal polysaccharide (PPSV23) vaccine. One dose is recommended after age 60. Talk to your health care provider about which screenings and vaccines you need and how often you need them. This information is not intended to replace advice given to you by your health care provider. Make sure you discuss any questions you have with your health care provider. Document Released: 09/14/2015 Document Revised: 05/07/2016 Document Reviewed: 06/19/2015 Elsevier Interactive Patient Education  2017 [place] Prevention in the Home Falls can cause injuries. They can happen to people of all ages. There are many things you can do to make your home safe and to help prevent falls. What can I do on the outside of my home?  Regularly fix the edges of walkways and driveways and fix any cracks.  Remove anything that might make you trip as you walk through a door, such as a raised step or threshold.  Trim any bushes or trees on the path to your home.  Use bright outdoor lighting.  Clear any walking paths of anything that might make someone trip, such as rocks or tools.  Regularly check to see if handrails are loose or broken. Make sure that both sides of any steps have handrails.  Any raised decks and porches should have guardrails on the edges.  Have any leaves, snow, or ice cleared regularly.  Use sand or salt on walking paths during winter.  Clean up any spills in your garage right away. This includes oil or grease spills. What can I do in the bathroom?  Use night lights.  Install grab bars by the toilet and in the tub and shower. Do not use towel bars as grab bars.  Use non-skid mats or decals in the tub or shower.  If you need to sit down in the shower, use a plastic, non-slip stool.  Keep the floor dry. Clean up any  water that spills on the floor as soon as it happens.  Remove soap buildup in the tub or shower regularly.  Attach bath mats securely with double-sided non-slip rug tape.  Do not have throw rugs and other things on the floor that can make you trip. What can I do in the bedroom?  Use night lights.  Make sure that you have a light by your bed that is easy to reach.  Do not use any sheets or blankets that are too big for your bed. They should not hang down onto the floor.  Have a firm chair that has side arms. You can use this for support while you get dressed.  Do not have throw rugs and other things on the floor that can make you trip. What can I do in the kitchen?  Clean up any spills right away.  Avoid walking on wet floors.  Keep items that you use a lot in easy-to-reach places.  If you need to reach something above you, use a strong step stool that has a grab bar.  Keep electrical cords out of the way.  Do not use floor polish or wax that makes floors slippery. If you must use wax, use non-skid floor wax.  Do  not have throw rugs and other things on the floor that can make you trip. What can I do with my stairs?  Do not leave any items on the stairs.  Make sure that there are handrails on both sides of the stairs and use them. Fix handrails that are broken or loose. Make sure that handrails are as long as the stairways.  Check any carpeting to make sure that it is firmly attached to the stairs. Fix any carpet that is loose or worn.  Avoid having throw rugs at the top or bottom of the stairs. If you do have throw rugs, attach them to the floor with carpet tape.  Make sure that you have a light switch at the top of the stairs and the bottom of the stairs. If you do not have them, ask someone to add them for you. What else can I do to help prevent falls?  Wear shoes that:  Do not have high heels.  Have rubber bottoms.  Are comfortable and fit you well.  Are closed  at the toe. Do not wear sandals.  If you use a stepladder:  Make sure that it is fully opened. Do not climb a closed stepladder.  Make sure that both sides of the stepladder are locked into place.  Ask someone to hold it for you, if possible.  Clearly mark and make sure that you can see:  Any grab bars or handrails.  First and last steps.  Where the edge of each step is.  Use tools that help you move around (mobility aids) if they are needed. These include:  Canes.  Walkers.  Scooters.  Crutches.  Turn on the lights when you go into a dark area. Replace any light bulbs as soon as they burn out.  Set up your furniture so you have a clear path. Avoid moving your furniture around.  If any of your floors are uneven, fix them.  If there are any pets around you, be aware of where they are.  Review your medicines with your doctor. Some medicines can make you feel dizzy. This can increase your chance of falling. Ask your doctor what other things that you can do to help prevent falls. This information is not intended to replace advice given to you by your health care provider. Make sure you discuss any questions you have with your health care provider. Document Released: 06/14/2009 Document Revised: 01/24/2016 Document Reviewed: 09/22/2014 Elsevier Interactive Patient Education  2017 [employer].

## 2018-07-20 NOTE — Telephone Encounter (Signed)
I can refer her to pulmonologist , I will send rx of tessalon perles

## 2018-07-20 NOTE — Progress Notes (Signed)
Subjective:   Lauren Johnston is a 67 y.o. female who presents for Medicare Annual (Subsequent) preventive examination.  Review of Systems:   Cardiac Risk Factors include: advanced age (>71mn, >>20women);obesity (BMI >30kg/m2)     Objective:     Vitals: BP 120/76 (BP Location: Left Arm, Patient Position: Sitting, Cuff Size: Normal)   Pulse 83   Temp (!) 97.5 F (36.4 C) (Oral)   Resp 16   Ht '5\' 7"'  (1.702 m)   Wt 195 lb 1.6 oz (88.5 kg)   SpO2 94%   BMI 30.56 kg/m   Body mass index is 30.56 kg/m.  Advanced Directives 07/20/2018 05/14/2017 05/06/2017 11/11/2016 07/09/2016 06/02/2016 02/05/2016  Does Patient Have a Medical Advance Directive? Yes No No No No No No  Type of [place] - - - - - -  Copy of [place]? No - copy requested - - - - - -  Would patient like information on creating a medical advance directive? - - - - No - patient declined information No - patient declined information No - patient declined information    Tobacco Social History   Tobacco Use  Smoking Status Never Smoker  Smokeless Tobacco Never Used     Counseling given: Not Answered   Clinical Intake:  Pre-visit preparation completed: Yes  Pain : No/denies pain     Nutritional Status: BMI > 30  Obese Diabetes: No  How often do you need to have someone help you when you read instructions, pamphlets, or other written materials from your doctor or pharmacy?: 1 - Never What is the last grade level you completed in school?: some college  Interpreter Needed?: No  Information entered by :: KClemetine MarkerLPn  Past Medical History:  Diagnosis Date  . Arthritis    hips  . Dental crowns present    dental implants - upper  . Depression   . Hyperlipidemia   . Hypothyroidism   . Thyroid disease    Past Surgical History:  Procedure Laterality Date  . ABDOMINAL HYSTERECTOMY    . BILATERAL OOPHORECTOMY    . COLONOSCOPY  WITH PROPOFOL N/A 06/02/2016   Procedure: COLONOSCOPY WITH PROPOFOL;  Surgeon: DLucilla Lame MD;  [facility]  Service: Endoscopy;  Laterality: N/A;  . dequavian tendonitis    . KNEE ARTHROSCOPY    . POLYPECTOMY  06/02/2016   Procedure: POLYPECTOMY;  Surgeon: DLucilla Lame MD;  [facility]  Service: Endoscopy;;   Family History  Problem Relation Age of Onset  . Diabetes Mother   . Heart disease Mother   . Emphysema Father   . Heart disease Father   . COPD Father   . Multiple myeloma Brother   . Alzheimer's disease Sister    Social History   Socioeconomic History  . Marital status: Married    Spouse name: Not on file  . Number of children: 3  . Years of education: Not on file  . Highest education level: Some college, no degree  Occupational History  . Occupation: retired  SScientific laboratory technician . Financial resource strain: Not hard at all  . Food insecurity:    Worry: Never true    Inability: Never true  . Transportation needs:    Medical: No    Non-medical: No  Tobacco Use  . Smoking status: Never Smoker  . Smokeless tobacco: Never Used  Substance and Sexual Activity  . Alcohol use:  No    Alcohol/week: 0.0 standard drinks  . Drug use: No  . Sexual activity: Yes    Partners: Male    Birth control/protection: Post-menopausal  Lifestyle  . Physical activity:    Days per week: 0 days    Minutes per session: 0 min  . Stress: Only a little  Relationships  . Social connections:    Talks on phone: More than three times a week    Gets together: Once a week    Attends religious service: More than 4 times per year    Active member of club or organization: Yes    Attends meetings of clubs or organizations: More than 4 times per year    Relationship status: Widowed  Other Topics Concern  . Not on file  Social History Narrative   Clois Johnston, but remarried Dec 9th 2018    Outpatient Encounter Medications as of 07/20/2018  Medication Sig  .  aspirin 81 MG tablet Take 81 mg by mouth daily.  Marland Kitchen atorvastatin (LIPITOR) 20 MG tablet Take 1 tablet (20 mg total) by mouth daily.  . benzonatate (TESSALON) 100 MG capsule Take 1-2 capsules (100-200 mg total) by mouth 3 (three) times daily as needed.  . Calcium Carb-Cholecalciferol (CALCIUM 1000 + D PO) Take by mouth.  . DULoxetine (CYMBALTA) 60 MG capsule Take 1 capsule (60 mg total) by mouth daily.  Marland Kitchen estradiol (ESTRACE) 0.5 MG tablet Take 1 tablet (0.5 mg total) by mouth daily.  Marland Kitchen levothyroxine (SYNTHROID, LEVOTHROID) 50 MCG tablet Take 1 tablet (50 mcg total) by mouth daily before breakfast.  . meloxicam (MOBIC) 7.5 MG tablet Take 1 tablet (7.5 mg total) by mouth daily. (Patient taking differently: Take 7.5 mg by mouth daily. )  . traZODone (DESYREL) 50 MG tablet Take 0.5-1 tablets (25-50 mg total) by mouth at bedtime as needed for sleep.  Marland Kitchen loratadine (CLARITIN) 10 MG tablet Take 1 tablet (10 mg total) by mouth daily. (Patient not taking: Reported on 07/20/2018)  . montelukast (SINGULAIR) 10 MG tablet Take 1 tablet (10 mg total) by mouth at bedtime. (Patient not taking: Reported on 07/20/2018)  . omeprazole (PRILOSEC) 40 MG capsule Take 1 capsule (40 mg total) by mouth daily. Before dinner (Patient not taking: Reported on 07/20/2018)   No facility-administered encounter medications on file as of 07/20/2018.     Activities of Daily Living In your present state of health, do you have any difficulty performing the following activities: 07/20/2018 06/02/2018  Hearing? N N  Comment declines hearing aids -  Vision? N Y  Comment wears eyeglasses glasses  Difficulty concentrating or making decisions? N N  Walking or climbing stairs? N N  Dressing or bathing? N N  Doing errands, shopping? N N  Preparing Food and eating ? N -  Using the Toilet? N -  In the past six months, have you accidently leaked urine? N -  Comment wears liners for protection -  Do you have problems with loss of bowel  control? N -  Managing your Medications? N -  Managing your Finances? N -  Housekeeping or managing your Housekeeping? N -  Some recent data might be hidden    Patient Care Team: Steele Sizer, MD as PCP - General (Family Medicine) Gabriel Carina, Betsey Holiday, MD as Physician Assistant (Endocrinology) Lovell Sheehan, MD as Consulting Physician (Orthopedic Surgery)    Assessment:   This is a routine wellness examination for Kellianne.  Exercise Activities and Dietary recommendations Current Exercise Habits: Home  exercise routine, Type of exercise: treadmill, Time (Minutes): 30, Frequency (Times/Week): 1, Weekly Exercise (Minutes/Week): 30, Intensity: Mild, Exercise limited by: None identified  Goals    . Increase physical activity     Increase physical activity to 150 minutes per week       Fall Risk Fall Risk  07/20/2018 06/02/2018 04/29/2018 12/16/2017 07/17/2017  Falls in the past year? 0 No No No No  Number falls in past yr: 0 - - - -   FALL RISK PREVENTION PERTAINING TO THE HOME:  Any stairs in or around the home WITH handrails? YES Home free of loose throw rugs in walkways, pet beds, electrical cords, etc? Yes  Adequate lighting in your home to reduce risk of falls? Yes   ASSISTIVE DEVICES UTILIZED TO PREVENT FALLS:  Life alert? No  Use of a cane, walker or w/c? No  Grab bars in the bathroom? Yes  Shower chair or bench in shower? No  Elevated toilet seat or a handicapped toilet? Yes   DME ORDERS:  DME order needed?  No   TIMED UP AND GO:  Was the test performed? Yes .  Length of time to ambulate 10 feet: 6 sec.   GAIT:  Appearance of gait: Gait stead-fast and without the use of an assistive device.  Education: Fall risk prevention has been discussed.  Intervention(s) required? No    Depression Screen PHQ 2/9 Scores 07/20/2018 06/02/2018 04/29/2018 12/16/2017  PHQ - 2 Score 0 0 0 0  PHQ- 9 Score 0 '2 2 2     ' Cognitive Function     6CIT Screen 07/20/2018  What Year?  0 points  What month? 0 points  What time? 0 points  Count back from 20 0 points  Months in reverse 0 points  Repeat phrase 2 points  Total Score 2    Immunization History  Administered Date(s) Administered  . Influenza Split 06/16/2007, 06/19/2008, 09/07/2008, 07/05/2009, 04/25/2010  . Influenza, High Dose Seasonal PF 05/14/2017, 06/02/2018  . Influenza, Seasonal, Injecte, Preservative Fre 06/25/2011, 05/26/2012, 05/18/2013  . Influenza,inj,Quad PF,6+ Mos 06/07/2014, 04/23/2015, 05/07/2016  . Influenza-Unspecified 06/07/2014  . Pneumococcal Conjugate-13 07/17/2017  . Pneumococcal Polysaccharide-23 02/05/2016  . Tdap 08/01/2010  . Zoster 04/07/2011    Qualifies for Shingles Vaccine? Yes  Zostavax completed 04/07/11. Due for Shingrix. Education has been provided regarding the importance of this vaccine. Pt has been advised to call insurance company to determine out of pocket expense. Advised may also receive vaccine at local pharmacy or Health Dept. Verbalized acceptance and understanding.  Tdap: Up to date  Flu Vaccine: Up to date   Pneumococcal Vaccine: Up to date   Screening Tests Health Maintenance  Topic Date Due  . MAMMOGRAM  08/28/2019  . TETANUS/TDAP  08/01/2020  . COLONOSCOPY  06/02/2021  . INFLUENZA VACCINE  Completed  . DEXA SCAN  Completed  . Hepatitis C Screening  Completed  . PNA vac Low Risk Adult  Completed    Cancer Screenings:  Colorectal Screening: Completed 06/02/16. Repeat every 5 years;   Mammogram: Completed 08/27/17. Repeat every year; scheduled for 08/30/18  Bone Density: Completed 04/28/17. Results reflect  OSTEOPENIA, Repeat every 2 years. Pt will notify us if she would like to repeat this screening in the near future.  Lung Cancer Screening: (Low Dose CT Chest recommended if Age 71-80 years, 30 pack-year currently smoking OR have quit w/in 15years.) does not qualify.   Additional Screening:  Hepatitis C Screening: does qualify; Completed  07/17/17  Vision Screening: Recommended annual ophthalmology exams for early detection of glaucoma and other disorders of the eye. Is the patient up to date with their annual eye exam?  Yes  Who is the provider or what is the name of the office in which the pt attends annual eye exams? [facility]  Dental Screening: Recommended annual dental exams for proper oral hygiene  Community Resource Referral:  CRR required this visit?  No      Plan:     I have personally reviewed and addressed the Medicare Annual Wellness questionnaire and have noted the following in the patient's chart:  A. Medical and social history B. Use of alcohol, tobacco or illicit drugs  C. Current medications and supplements D. Functional ability and status E.  Nutritional status F.  Physical activity G. Advance directives H. List of other physicians I.  Hospitalizations, surgeries, and ER visits in previous 12 months J.  [place] such as hearing and vision if needed, cognitive and depression L. Referrals and appointments   In addition, I have reviewed and discussed with patient certain preventive protocols, quality metrics, and best practice recommendations. A written personalized care plan for preventive services as well as general preventive health recommendations were provided to patient.   Signed,  Clemetine Marker, LPN Nurse Health Advisor   Nurse Notes: pt c/o of persistent productive cough primarily at night. She was seen for this issue 06/02/18 and has upcoming appt with Dr. Ancil Boozer 08/02/18 and plans to discuss at that time. Pt appreciative of wellness visit today.

## 2018-07-20 NOTE — Telephone Encounter (Signed)
Pt seen in office today for AWV and requesting refill of benzonatate 100mg  for cough. She would like this sent to mail order pharmacy due to cost. Pt c/o persistent productive cough at night and constantly coughing up phlegm. She states that the claritin and singulair that were prescribed dry her out too bad and she started the omeprazole but stopped it because she doesn't think she has reflux even though that may be a contributing factor to night time cough. Pt scheduled for follow up 08/02/18. Please contact her if able to refill and send to mail order. Thank you!

## 2018-07-20 NOTE — Telephone Encounter (Signed)
Left patient a message notifying her of the cough medication sent in to her pharmacy and about the Pulmonology referral.

## 2018-07-23 NOTE — Telephone Encounter (Signed)
Med's by mail [facility] is calling to advise the patient is not in there system  And they believe this medication was sent over in error. CB# [phone]

## 2018-07-25 ENCOUNTER — Other Ambulatory Visit: Payer: Self-pay | Admitting: Family Medicine

## 2018-07-26 MED ORDER — BENZONATATE 100 MG PO CAPS: 100.0000 mg | ORAL_CAPSULE | Freq: Three times a day (TID) | ORAL | 0 refills | Status: DC | PRN

## 2018-07-26 NOTE — Telephone Encounter (Signed)
Patient would like a local prescription and another one to [facility]. I called [facility] and they stated they never did receive them. Also patient would like to be seen at a Pulmonologist due to this chronic cough keeping her up at night.

## 2018-07-26 NOTE — Addendum Note (Signed)
Addended by: Inda Coke on: 07/26/2018 01:27 PM   Modules accepted: Orders

## 2018-08-02 ENCOUNTER — Ambulatory Visit (INDEPENDENT_AMBULATORY_CARE_PROVIDER_SITE_OTHER): Payer: Medicare Other | Admitting: Family Medicine

## 2018-08-02 ENCOUNTER — Encounter: Payer: Self-pay | Admitting: Family Medicine

## 2018-08-02 VITALS — BP 124/68 | HR 76 | Temp 98.0°F | Resp 16 | Ht 67.0 in | Wt 193.4 lb

## 2018-08-02 DIAGNOSIS — F331 Major depressive disorder, recurrent, moderate: Secondary | ICD-10-CM | POA: Diagnosis not present

## 2018-08-02 DIAGNOSIS — R05 Cough: Secondary | ICD-10-CM | POA: Diagnosis not present

## 2018-08-02 DIAGNOSIS — E7800 Pure hypercholesterolemia, unspecified: Secondary | ICD-10-CM

## 2018-08-02 DIAGNOSIS — E669 Obesity, unspecified: Secondary | ICD-10-CM | POA: Diagnosis not present

## 2018-08-02 DIAGNOSIS — M79651 Pain in right thigh: Secondary | ICD-10-CM

## 2018-08-02 DIAGNOSIS — Z01419 Encounter for gynecological examination (general) (routine) without abnormal findings: Secondary | ICD-10-CM

## 2018-08-02 DIAGNOSIS — R053 Chronic cough: Secondary | ICD-10-CM

## 2018-08-02 NOTE — Progress Notes (Signed)
Name: Lauren Johnston   MRN: [id number]    DOB: [birth year]   Date:08/02/2018       Progress Note  Subjective  Chief Complaint  Chief Complaint  Patient presents with  . Annual Exam    Had a hysterectomy    HPI   Patient presents for annual CPE and follow up.  Diet: she tries to eat healthy  Exercise: not as active this time of the year, discussed walking inside the mall.   Chronic cough: she is still coughing every night and has to take tessalon perles. She states productive cough at night, no wheezing or sob, she stopped singulair and allergy medication. She denies gerd symptoms, CXR normal , not improving we will refer her to pulmonologist Symptoms going on for over 7 months now.   Right inner thigh pain: she rented a car for about month back in Sept while her car was getting repaired, she states it was a small car and noticed pain initially when going in an out of the car and when using pedals. She tried to adjust the sit but continues to have pain now. Worse when abduction or flexing quad muscles. Leg lifts are very hard now. She denies any back pain or knee problems. She denies limping.   Hyperlipidemia: taking Atorvastatin and come in fasting for labs, no chest pain or palpitation. No myalgia.     Office Visit from 06/02/2018 in [facility]  AUDIT-C Score  0     Depression:  Depression screen [facility] 2/9 08/02/2018 07/20/2018 06/02/2018 04/29/2018 12/16/2017  Decreased Interest 0 0 0 0 0  Down, Depressed, Hopeless 0 0 0 0 0  PHQ - 2 Score 0 0 0 0 0  Altered sleeping 1 0 _0 Tired, decreased energy 1 0 _1 Change in appetite 0 0 0 0 0  Feeling bad or failure about yourself  0 0 0 0 0  Trouble concentrating 0 0 0 0 0  Moving slowly or fidgety/restless 0 0 0 0 0  Suicidal thoughts 0 0 0 0 0  PHQ-9 Score 2 0 _2 Difficult doing work/chores - - Not difficult at all Not difficult at all Not difficult at all   Hypertension: BP Readings from Last 3  Encounters:  08/02/18 124/68  07/20/18 120/76  06/02/18 106/70   Obesity: Wt Readings from Last 3 Encounters:  08/02/18 193 lb 6.4 oz (87.7 kg)  07/20/18 195 lb 1.6 oz (88.5 kg)  06/02/18 193 lb 1.6 oz (87.6 kg)   BMI Readings from Last 3 Encounters:  08/02/18 30.29 kg/m  07/20/18 30.56 kg/m  06/02/18 30.24 kg/m    Hep C Screening: up to date STD testing and prevention (HIV/chl/gon/syphilis): N/A Intimate partner violence: negative  Sexual History/Pain during Intercourse: no pain during intercourse , uses k- y  Menstrual History/LMP/Abnormal Bleeding: s/p hysterectomy, not for cancer Incontinence Symptoms: she has some stress incontinence when coughing hard   Advanced Care Planning: A voluntary discussion about advance care planning including the explanation and discussion of advance directives.  Discussed health care proxy and Living will, and the patient was able to identify a health care proxy as daughter  Patient does have a living will at present time.   Breast cancer: scheduled  BRCA gene screening: N/A Cervical cancer screening: history of hysterectomy   Osteoporosis Screening: up to date   Lipids:  Lab Results  Component Value Date   CHOL 145 05/12/2017  CHOL 161 03/06/2016   CHOL 163 08/29/2015   Lab Results  Component Value Date   HDL 50 (L) 05/12/2017   HDL 55 03/06/2016   HDL 58 08/29/2015   Lab Results  Component Value Date   LDLCALC 80 05/12/2017   LDLCALC 92 03/06/2016   LDLCALC 92 08/29/2015   Lab Results  Component Value Date   TRIG 69 05/12/2017   TRIG 72 03/06/2016   TRIG 63 08/29/2015   Lab Results  Component Value Date   CHOLHDL 2.9 05/12/2017   CHOLHDL 2.9 03/06/2016   CHOLHDL 2.8 08/29/2015   No results found for: LDLDIRECT  Glucose:  Glucose  Date Value Ref Range Status  08/14/2012 111 (H) 65 - 99 mg/dL Final   Glucose, Bld  Date Value Ref Range Status  12/16/2017 77 65 - 139 mg/dL Final    Comment:    .         Non-fasting reference interval .   05/12/2017 92 65 - 99 mg/dL Final    Comment:    .            Fasting reference interval .   07/09/2016 96 65 - 99 mg/dL Final    Skin cancer: discussed atypical lesions  Colorectal cancer: up do date    ECG: today, she has a history of hyperlipidemia  Patient Active Problem List   Diagnosis Date Noted  . Class 1 obesity in adult 11/11/2016  . Osteopenia 10/09/2016  . Special screening for malignant neoplasms, colon   . Rectal polyp   . Right wrist tendonitis 05/16/2016  . Osteoarthritis of left hip 05/16/2016  . Vitamin D deficiency 02/05/2016  . Cold sore 02/05/2016  . Degeneration of intervertebral disc of cervical region 02/05/2016  . Insomnia 04/23/2015  . Major depression in remission (HCC) 04/23/2015  . Chronic pain 04/23/2015  . Goiter, nontoxic, 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  . 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 Uterine leiomyoma 06/16/2007    Past Surgical History:  Procedure Laterality Date  . ABDOMINAL HYSTERECTOMY    . BILATERAL OOPHORECTOMY    . COLONOSCOPY WITH PROPOFOL N/A 06/02/2016   Procedure: COLONOSCOPY WITH PROPOFOL;  Surgeon: Darren Wohl, MD;  Location: [facility];  Service: Endoscopy;  Laterality: N/A;  . dequavian tendonitis    . KNEE ARTHROSCOPY    . POLYPECTOMY  06/02/2016   Procedure: POLYPECTOMY;  Surgeon: Darren Wohl, MD;  Location: [facility];  Service: Endoscopy;;    Family History  Problem Relation Age of Onset  . Diabetes Mother   . Heart disease Mother   . Emphysema Father   . Heart disease Father   . COPD Father   . Multiple myeloma Brother   . Alzheimer's disease Sister     Social History   Socioeconomic History  . Marital status: Married    Spouse name: Not on file  . Number of children: 3  . Years of education: Not on file  . Highest education level: Some college, no degree  Occupational History  . Occupation: retired  Social Needs  . Financial resource  strain: Not hard at all  . Food insecurity:    Worry: Never true    Inability: Never true  . Transportation needs:    Medical: No    Non-medical: No  Tobacco Use  . Smoking status: Never Smoker  . Smokeless tobacco: Never Used  Substance and Sexual Activity  . Alcohol use: No    Alcohol/week: 0.0 standard drinks  . Drug use:   No  . Sexual activity: Yes    Partners: Male    Birth control/protection: Post-menopausal    Comment: Hysterectomy  Lifestyle  . Physical activity:    Days per week: 0 days    Minutes per session: 0 min  . Stress: Only a little  Relationships  . Social connections:    Talks on phone: More than three times a week    Gets together: Once a week    Attends religious service: More than 4 times per year    Active member of club or organization: Yes    Attends meetings of clubs or organizations: More than 4 times per year    Relationship status: Widowed  . Intimate partner violence:    Fear of current or ex partner: No    Emotionally abused: No    Physically abused: No    Forced sexual activity: No  Other Topics Concern  . Not on file  Social History Narrative   Widow, but remarried Dec 8th 2018     Current Outpatient Medications:  .  aspirin 81 MG tablet, Take 81 mg by mouth daily., Disp: , Rfl:  .  atorvastatin (LIPITOR) 20 MG tablet, Take 1 tablet (20 mg total) by mouth daily., Disp: 90 tablet, Rfl: 1 .  Calcium Carb-Cholecalciferol (CALCIUM 1000 + D PO), Take by mouth., Disp: , Rfl:  .  DULoxetine (CYMBALTA) 60 MG capsule, Take 1 capsule (60 mg total) by mouth daily., Disp: 90 capsule, Rfl: 1 .  estradiol (ESTRACE) 0.5 MG tablet, Take 1 tablet (0.5 mg total) by mouth daily., Disp: 90 tablet, Rfl: 1 .  levothyroxine (SYNTHROID, LEVOTHROID) 50 MCG tablet, Take 1 tablet (50 mcg total) by mouth daily before breakfast., Disp: 90 tablet, Rfl: 0 .  traZODone (DESYREL) 50 MG tablet, Take 0.5-1 tablets (25-50 mg total) by mouth at bedtime as needed for  sleep., Disp: 90 tablet, Rfl: 1  Allergies  Allergen Reactions  . Sulfamethoxazole-Trimethoprim Other (See Comments) and Rash  . Citalopram Itching  . Tetracycline Other (See Comments)    Other Reaction: URTICARIA  . Tetracyclines & Related Itching  . Codeine Itching     ROS  Constitutional: Negative for fever or weight change.  Respiratory: Negative for cough and shortness of breath.   Cardiovascular: Negative for chest pain or palpitations.  Gastrointestinal: Negative for abdominal pain, no bowel changes.  Musculoskeletal: Negative for gait problem or joint swelling.  Skin: Negative for rash.  Neurological: Negative for dizziness or headache.  No other specific complaints in a complete review of systems (except as listed in HPI above).  Objective  Vitals:   08/02/18 1052  BP: 124/68  Pulse: 76  Resp: 16  Temp: 98 F (36.7 C)  TempSrc: Oral  SpO2: 98%  Weight: 193 lb 6.4 oz (87.7 kg)  Height: 5' 7" (1.702 m)    Body mass index is 30.29 kg/m.  Physical Exam  Constitutional: Patient appears well-developed and obese . No distress.  HENT: Head: Normocephalic and atraumatic. Ears: B TMs ok, no erythema or effusion; Nose: Nose normal. Mouth/Throat: Oropharynx is clear and moist. No oropharyngeal exudate.  Eyes: Conjunctivae and EOM are normal. Pupils are equal, round, and reactive to light. No scleral icterus.  Neck: Normal range of motion. Neck supple. No JVD present. Thyromegaly present.  Cardiovascular: Normal rate, regular rhythm and normal heart sounds.  No murmur heard. No BLE edema. Pulmonary/Chest: Effort normal and breath sounds normal. No respiratory distress. Abdominal: Soft. Bowel sounds are normal,   no distension. There is no tenderness. no masses Breast: no lumps or masses, no nipple discharge or rashes FEMALE GENITALIA:  External genitalia normal External urethra normal Speculum exam not done  RECTAL: not done Musculoskeletal: Normal range of motion  of hips and knees, but has pain during palpation or right medial thigh  , abduction and  flexion of right hip also causes  Neurological: he is alert and oriented to person, place, and time. No cranial nerve deficit. Coordination, balance, strength, speech and gait are normal.  Skin: Skin is warm and dry. No rash noted. No erythema.  Psychiatric: Patient has a normal mood and affect. behavior is normal. Judgment and thought content normal.  PHQ2/9: Depression screen [facility] 2/9 08/02/2018 07/20/2018 06/02/2018 04/29/2018 12/16/2017  Decreased Interest 0 0 0 0 0  Down, Depressed, Hopeless 0 0 0 0 0  PHQ - 2 Score 0 0 0 0 0  Altered sleeping 1 0 _0 Tired, decreased energy 1 0 _1 Change in appetite 0 0 0 0 0  Feeling bad or failure about yourself  0 0 0 0 0  Trouble concentrating 0 0 0 0 0  Moving slowly or fidgety/restless 0 0 0 0 0  Suicidal thoughts 0 0 0 0 0  PHQ-9 Score 2 0 _2 Difficult doing work/chores - - Not difficult at all Not difficult at all Not difficult at all    Fall Risk: Fall Risk  08/02/2018 07/20/2018 06/02/2018 04/29/2018 12/16/2017  Falls in the past year? 0 0 No No No  Number falls in past yr: 0 0 - - -    Functional Status Survey: Is the patient deaf or have difficulty hearing?: No Does the patient have difficulty seeing, even when wearing glasses/contacts?: Yes(glasses) Does the patient have difficulty concentrating, remembering, or making decisions?: No Does the patient have difficulty walking or climbing stairs?: No Does the patient have difficulty dressing or bathing?: No Does the patient have difficulty doing errands alone such as visiting a doctor's office or shopping?: No   Assessment & Plan  1. Well woman exam   2. Pain in right thigh  Going on for a few months, discussed PT versus chiropractor and she will check to see what is covered, she would like to see chiropractor if covered by her insurance  3. Chronic cough  - Ambulatory referral to  Pulmonology.   4. Pure hypercholesterolemia  - EKG 12-Lead  -USPSTF grade A and B recommendations reviewed with patient; age-appropriate recommendations, preventive care, screening tests, etc discussed and encouraged; healthy living encouraged; see AVS for patient education given to patient -Discussed importance of 150 minutes of physical activity weekly, eat two servings of fish weekly, eat one serving of tree nuts ( cashews, pistachios, pecans, almonds.Marland Kitchen) every other day, eat 6 servings of fruit/vegetables daily and drink plenty of water and avoid sweet beverages.

## 2018-08-03 LAB — CBC WITH DIFFERENTIAL/PLATELET
Basophils Absolute: 18 cells/uL (ref 0–200)
Basophils Relative: 0.4 %
Eosinophils Absolute: 189 cells/uL (ref 15–500)
Eosinophils Relative: 4.1 %
HCT: 41.5 % (ref 35.0–45.0)
Hemoglobin: 13.5 g/dL (ref 11.7–15.5)
Lymphs Abs: 1555 cells/uL (ref 850–3900)
MCH: 29.9 pg (ref 27.0–33.0)
MCHC: 32.5 g/dL (ref 32.0–36.0)
MCV: 92 fL (ref 80.0–100.0)
MONOS PCT: 8.9 %
MPV: 10 fL (ref 7.5–12.5)
NEUTROS PCT: 52.8 %
Neutro Abs: 2429 cells/uL (ref 1500–7800)
Platelets: 350 10*3/uL (ref 140–400)
RBC: 4.51 10*6/uL (ref 3.80–5.10)
RDW: 12.3 % (ref 11.0–15.0)
Total Lymphocyte: 33.8 %
WBC: 4.6 10*3/uL (ref 3.8–10.8)
WBCMIX: 409 {cells}/uL (ref 200–950)

## 2018-08-03 LAB — COMPLETE METABOLIC PANEL WITH GFR
AG Ratio: 1.2 (calc) (ref 1.0–2.5)
ALKALINE PHOSPHATASE (APISO): 68 U/L (ref 33–130)
ALT: 11 U/L (ref 6–29)
AST: 17 U/L (ref 10–35)
Albumin: 3.9 g/dL (ref 3.6–5.1)
BILIRUBIN TOTAL: 0.6 mg/dL (ref 0.2–1.2)
BUN/Creatinine Ratio: 14 (calc) (ref 6–22)
BUN: 16 mg/dL (ref 7–25)
CHLORIDE: 104 mmol/L (ref 98–110)
CO2: 31 mmol/L (ref 20–32)
CREATININE: 1.17 mg/dL — AB (ref 0.50–0.99)
Calcium: 9.1 mg/dL (ref 8.6–10.4)
GFR, Est African American: 56 mL/min/{1.73_m2} — ABNORMAL LOW (ref >=60)
GFR, Est Non African American: 48 mL/min/{1.73_m2} — ABNORMAL LOW (ref >=60)
GLOBULIN: 3.3 g/dL (ref 1.9–3.7)
Glucose, Bld: 92 mg/dL (ref 65–99)
POTASSIUM: 4.2 mmol/L (ref 3.5–5.3)
SODIUM: 139 mmol/L (ref 135–146)
Total Protein: 7.2 g/dL (ref 6.1–8.1)

## 2018-08-03 LAB — HEMOGLOBIN A1C
EAG (MMOL/L): 6.3 (calc)
HEMOGLOBIN A1C: 5.6 %{Hb} (ref <5.7)
Mean Plasma Glucose: 114 (calc)

## 2018-08-03 LAB — LIPID PANEL
CHOL/HDL RATIO: 3 (calc) (ref <5.0)
Cholesterol: 154 mg/dL (ref <200)
HDL: 52 mg/dL (ref >50)
LDL Cholesterol (Calc): 86 mg/dL (calc)
NON-HDL CHOLESTEROL (CALC): 102 mg/dL (ref <130)
Triglycerides: 70 mg/dL (ref <150)

## 2018-08-03 NOTE — Addendum Note (Signed)
Addended by: Inda Coke on: 08/03/2018 09:17 AM   Modules accepted: Orders

## 2018-08-30 ENCOUNTER — Ambulatory Visit
Admission: RE | Admit: 2018-08-30 | Discharge: 2018-08-30 | Disposition: A | Discharge status: Home / Self Care | Payer: Medicare Other | Source: Ambulatory Visit | Attending: Family Medicine | Admitting: Family Medicine

## 2018-08-30 DIAGNOSIS — Z1231 Encounter for screening mammogram for malignant neoplasm of breast: Secondary | ICD-10-CM

## 2018-09-21 ENCOUNTER — Institutional Professional Consult (permissible substitution): Payer: Medicare Other | Admitting: Internal Medicine

## 2018-09-21 NOTE — Progress Notes (Signed)
[name] Consultation      Assessment and Plan:  Chronic cough with chronic bronchitis.  Symptoms are predominantly nighttime. - Her main problem appears to be excess expectoration which wakes her up at night and has to be coughed out.  She does not notice the same problem during the day. - Will treat empirically with nasal steroid, PPI, antihistamine to see if this helps.  If this does not help we will try an anticholinergic inhaler, and possibly dextromethorphan at night.  Patient has had response with Tessalon in the past. - Patient does have a slight deviation of the upper trachea which appears to be chronic.  Doubt that this is contributory, however patient does not respond to usual therapies will consider further investigation with CT scan down the road.    Date: 09/22/2018  MRN# [id number] Lauren Johnston 04/23/1951    Lauren Johnston is a 68 y.o. old female seen in consultation for chief complaint of:    Chief Complaint  Patient presents with  . Consult    Referred by Dr. Sowles for eval of cough. Office spirometry done on 06/02/18.  . Cough    x 6 mos. Pt states cough wakes her up during the night, thick white mucus.    HPI:  Patient referred for ongoing cough about 8 months duration. It was insidious in onset. She woke up at night with a lot of phlegm, and she has to keep a spit container by her bed. Once she starts moving around she feels that it is less and does not have to cough up as much stuff.  She was never a smoker, her father smoked. Her first husband smoked.  She used to have bronchitis episodes every year, usually in the winter. She has never been diagnosed with asthma.  [place]. She works in her yard every summer. 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   She takes no ACEi,, does not have BP.  She has taken tessalon which helps.  She has not tried OTC cough medicines.  She  denies nasal drainage. She denies reflux and was tried on something for reflux but not for very long.  She was tried on an inhaler and thinks it did not help.  She has not been tested for allergies.   **Spirometry 06/02/2018, tracings personally reviewed>> FVC is 2.43 L, 87% predicted, FEV1 is 2.04 L, 93% predicted.  Ratio is 83%.  Flow volume loops are unremarkable.  Overall this test shows normal pulmonary functions. **Chest x-ray 05/04/2018>> image personally reviewed, slight rightward deviation of the upper trachea, otherwise unremarkable. **CBC 05/12/2017>> absolute eosinophil count 1131.   PMHX:   Past Medical History:  Diagnosis Date  . Arthritis    hips  . Dental crowns present    dental implants - upper  . Depression   . Hyperlipidemia   . Hypothyroidism   . Thyroid disease    Surgical Hx:  Past Surgical History:  Procedure Laterality Date  . ABDOMINAL HYSTERECTOMY    . BILATERAL OOPHORECTOMY    . COLONOSCOPY WITH PROPOFOL N/A 06/02/2016   Procedure: COLONOSCOPY WITH PROPOFOL;  Surgeon: Darren Wohl, MD;  Location: [facility];  Service: Endoscopy;  Laterality: N/A;  . dequavian tendonitis    . KNEE ARTHROSCOPY    . POLYPECTOMY  06/02/2016   Procedure: POLYPECTOMY;  Surgeon: Darren Wohl, MD;  Location: [facility];    Service: Endoscopy;;   Family Hx:  Family History  Problem Relation Age of Onset  . Diabetes Mother   . Heart disease Mother   . Emphysema Father   . Heart disease Father   . COPD Father   . Multiple myeloma Brother   . Alzheimer's disease Sister    Social Hx:   Social History   Tobacco Use  . Smoking status: Never Smoker  . Smokeless tobacco: Never Used  Substance Use Topics  . Alcohol use: No    Alcohol/week: 0.0 standard drinks  . Drug use: No   Medication:    Current Outpatient Medications:  .  aspirin 81 MG tablet, Take 81 mg by mouth daily., Disp: , Rfl:  .  atorvastatin (LIPITOR) 20 MG tablet, Take 1 tablet (20 mg total)  by mouth daily., Disp: 90 tablet, Rfl: 1 .  Calcium Carb-Cholecalciferol (CALCIUM 1000 + D PO), Take by mouth., Disp: , Rfl:  .  DULoxetine (CYMBALTA) 60 MG capsule, Take 1 capsule (60 mg total) by mouth daily., Disp: 90 capsule, Rfl: 1 .  estradiol (ESTRACE) 0.5 MG tablet, Take 1 tablet (0.5 mg total) by mouth daily., Disp: 90 tablet, Rfl: 1 .  levothyroxine (SYNTHROID, LEVOTHROID) 50 MCG tablet, Take 1 tablet (50 mcg total) by mouth daily before breakfast., Disp: 90 tablet, Rfl: 0 .  traZODone (DESYREL) 50 MG tablet, Take 0.5-1 tablets (25-50 mg total) by mouth at bedtime as needed for sleep., Disp: 90 tablet, Rfl: 1   Allergies:  Sulfamethoxazole-trimethoprim; Citalopram; Tetracycline; Tetracyclines & related; and Codeine  Review of Systems: Gen:  Denies  fever, sweats, chills HEENT: Denies blurred vision, double vision. bleeds, sore throat Cvc:  No dizziness, chest pain. Resp:   Denies cough or sputum production, shortness of breath Gi: Denies swallowing difficulty, stomach pain. Gu:  Denies bladder incontinence, burning urine Ext:   No Joint pain, stiffness. Skin: No skin rash,  hives  Endoc:  No polyuria, polydipsia. Psych: No depression, insomnia. Other:  All other systems were reviewed with the patient and were negative other that what is mentioned in the HPI.   Physical Examination:   VS: BP 122/80 (BP Location: Left Arm, Cuff Size: Large)   Pulse 72   Resp 16   Ht 5' 7" (1.702 m)   Wt 194 lb (88 kg)   SpO2 99%   BMI 30.38 kg/m   General Appearance: No distress  Neuro:without focal findings,  speech normal,  HEENT: PERRLA, EOM intact.   Pulmonary: normal breath sounds, No wheezing.  CardiovascularNormal S1,S2.  No m/r/g.   Abdomen: Benign, Soft, non-tender. Renal:  No costovertebral tenderness  GU:  No performed at this time. Endoc: No evident thyromegaly, no signs of acromegaly. Skin:   warm, no rashes, no ecchymosis  Extremities: normal, no cyanosis,  clubbing.  Other findings:    LABORATORY PANEL:   CBC No results for input(s): WBC, HGB, HCT, PLT in the last 168 hours. ------------------------------------------------------------------------------------------------------------------  Chemistries  No results for input(s): NA, K, CL, CO2, GLUCOSE, BUN, CREATININE, CALCIUM, MG, AST, ALT, ALKPHOS, BILITOT in the last 168 hours.  Invalid input(s): GFRCGP ------------------------------------------------------------------------------------------------------------------  Cardiac Enzymes No results for input(s): TROPONINI in the last 168 hours. ------------------------------------------------------------  RADIOLOGY:  No results found.     Thank  you for the consultation and for allowing [facility] to assist in the care of your patient. Our recommendations are noted above.  Please contact us if we can be of further service.     Deep , M.D., F.C.C.P.  Board Certified in Internal Medicine, Pulmonary Medicine, Critical Care Medicine, and Sleep Medicine.  [facility] Office Number: [phone]   09/22/2018  

## 2018-09-22 ENCOUNTER — Ambulatory Visit (INDEPENDENT_AMBULATORY_CARE_PROVIDER_SITE_OTHER): Payer: Medicare Other | Admitting: Internal Medicine

## 2018-09-22 ENCOUNTER — Encounter: Payer: Self-pay | Admitting: Internal Medicine

## 2018-09-22 VITALS — BP 122/80 | HR 72 | Resp 16 | Ht 67.0 in | Wt 194.0 lb

## 2018-09-22 DIAGNOSIS — J411 Mucopurulent chronic bronchitis: Secondary | ICD-10-CM

## 2018-09-22 MED ORDER — FLUTICASONE PROPIONATE 50 MCG/ACT NA SUSP: 2.0000 | Freq: Every day | NASAL | 0 refills | Status: DC

## 2018-09-22 MED ORDER — OMEPRAZOLE 20 MG PO CPDR: 20.0000 mg | DELAYED_RELEASE_CAPSULE | Freq: Every day | ORAL | 11 refills | Status: DC

## 2018-09-22 MED ORDER — CETIRIZINE HCL 10 MG PO CAPS: 1.0000 | ORAL_CAPSULE | Freq: Every day | ORAL | 0 refills | Status: DC

## 2018-09-22 NOTE — Patient Instructions (Signed)
-  Start taking omeprazole 20 mg once daily. - Start Flonase 2 sprays in each nostril every night. - Start antihistamine such as Allegra, Claritin, or Xyzal every night.  Call us back in 2 weeks with progress.

## 2018-10-07 NOTE — Telephone Encounter (Signed)
error 

## 2018-11-02 ENCOUNTER — Ambulatory Visit (INDEPENDENT_AMBULATORY_CARE_PROVIDER_SITE_OTHER): Payer: Medicare Other | Admitting: Family Medicine

## 2018-11-02 ENCOUNTER — Encounter: Payer: Self-pay | Admitting: Family Medicine

## 2018-11-02 VITALS — BP 120/78 | HR 92 | Temp 97.4°F | Resp 16 | Ht 67.0 in | Wt 199.7 lb

## 2018-11-02 DIAGNOSIS — F5101 Primary insomnia: Secondary | ICD-10-CM | POA: Diagnosis not present

## 2018-11-02 DIAGNOSIS — E7800 Pure hypercholesterolemia, unspecified: Secondary | ICD-10-CM

## 2018-11-02 DIAGNOSIS — N183 Chronic kidney disease, stage 3 unspecified: Secondary | ICD-10-CM

## 2018-11-02 DIAGNOSIS — E559 Vitamin D deficiency, unspecified: Secondary | ICD-10-CM

## 2018-11-02 DIAGNOSIS — F325 Major depressive disorder, single episode, in full remission: Secondary | ICD-10-CM | POA: Diagnosis not present

## 2018-11-02 DIAGNOSIS — M170 Bilateral primary osteoarthritis of knee: Secondary | ICD-10-CM

## 2018-11-02 DIAGNOSIS — E042 Nontoxic multinodular goiter: Secondary | ICD-10-CM

## 2018-11-02 DIAGNOSIS — R053 Chronic cough: Secondary | ICD-10-CM

## 2018-11-02 DIAGNOSIS — R05 Cough: Secondary | ICD-10-CM | POA: Diagnosis not present

## 2018-11-02 DIAGNOSIS — E669 Obesity, unspecified: Secondary | ICD-10-CM

## 2018-11-02 MED ORDER — TRAZODONE HCL 50 MG PO TABS: 25.0000 mg | ORAL_TABLET | Freq: Every evening | ORAL | 1 refills | Status: DC | PRN

## 2018-11-02 MED ORDER — CETIRIZINE HCL 10 MG PO CAPS: 1.0000 | ORAL_CAPSULE | Freq: Every day | ORAL | 1 refills | Status: DC

## 2018-11-02 MED ORDER — ATORVASTATIN CALCIUM 20 MG PO TABS: 20.0000 mg | ORAL_TABLET | Freq: Every day | ORAL | 1 refills | Status: DC

## 2018-11-02 MED ORDER — DULOXETINE HCL 60 MG PO CPEP: 60.0000 mg | ORAL_CAPSULE | Freq: Every day | ORAL | 1 refills | Status: DC

## 2018-11-02 MED ORDER — FLUTICASONE PROPIONATE 50 MCG/ACT NA SUSP: 2.0000 | Freq: Every day | NASAL | 1 refills | Status: DC

## 2018-11-02 NOTE — Progress Notes (Signed)
Name: Lauren Johnston   MRN: [id number]    DOB: 07-12-1951   Date:11/02/2018       Progress Note  Subjective  Chief Complaint  Chief Complaint  Patient presents with  . Hypothyroidism  . Depression  . Pain    HPI  Chronic cough: going on for about one year now, seen by Dr. Rosine Abe, using nasal spray, zyrtec  and omeprazole, but still has a nocturnal cough and sputum production. On Dr. Jeronimo Greaves note next step was to get CT chest, advised her to call his office.   Hyperlipidemia: taking Atorvastatin , reviewed labs and at goal, no myalgia.   OA both knees: recent fall at church, she missed a step and hit her knee on the rail ( she caught herself with the railing), left knee, she states it was bruised, some effusion , used ice pack. It happened on Feb 2 nd. She states affected her gait for one week but is doing well now, just aching pain, but no effusion or difficulty ambulating now. She asked for handicap sticker but explained she does not qualify  Obesity: she gained 2 lbs since last visit, states not as compliant with diet through the holidays, states more active during the Summer  Insomnia: she takes trazodone occasionally, she states not sure how it works because not very compliant.   CKI stage III: discussed avoiding NSAID's and drink more water, taking Tylenol prn   Depression: stressed taking her sister to [facility] ( moved two weeks ago) she has dementia. Finally all settled and starting to feel better again, long history of depression but doing well with Duloxetine. No side effects.  Patient Active Problem List   Diagnosis Date Noted  . Class 1 obesity in adult 11/11/2016  . Osteopenia 10/09/2016  . Special screening for malignant neoplasms, colon   . Rectal polyp   . Right wrist tendonitis 05/16/2016  . Osteoarthritis of left hip 05/16/2016  . Vitamin D deficiency 02/05/2016  . Cold sore 02/05/2016  . Degeneration of intervertebral disc of cervical region  02/05/2016  . Insomnia 04/23/2015  . Major depression in remission ([place]) 04/23/2015  . Chronic pain 04/23/2015  . Goiter, nontoxic, 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  . 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 Uterine leiomyoma 06/16/2007    Past Surgical History:  Procedure Laterality Date  . ABDOMINAL HYSTERECTOMY    . BILATERAL OOPHORECTOMY    . COLONOSCOPY WITH PROPOFOL N/A 06/02/2016   Procedure: COLONOSCOPY WITH PROPOFOL;  Surgeon: Lucilla Lame, MD;  Location: [place];  Service: Endoscopy;  Laterality: N/A;  . dequavian tendonitis    . KNEE ARTHROSCOPY    . POLYPECTOMY  06/02/2016   Procedure: POLYPECTOMY;  Surgeon: Lucilla Lame, MD;  Location: [place];  Service: Endoscopy;;    Family History  Problem Relation Age of Onset  . Diabetes Mother   . Heart disease Mother   . Emphysema Father   . Heart disease Father   . COPD Father   . Multiple myeloma Brother   . Alzheimer's disease Sister   . Dementia Sister     Social History   Socioeconomic History  . Marital status: Married    Spouse name: Not on file  . Number of children: 3  . Years of education: Not on file  . Highest education level: Some college, no degree  Occupational History  . Occupation: retired  Scientific laboratory technician  . Financial resource strain: Not hard at all  . Food insecurity:    Worry:  Never true    Inability: Never true  . Transportation needs:    Medical: No    Non-medical: No  Tobacco Use  . Smoking status: Never Smoker  . Smokeless tobacco: Never Used  Substance and Sexual Activity  . Alcohol use: No    Alcohol/week: 0.0 standard drinks  . Drug use: No  . Sexual activity: Yes    Partners: Male    Birth control/protection: Post-menopausal    Comment: Hysterectomy  Lifestyle  . Physical activity:    Days per week: 0 days    Minutes per session: 0 min  . Stress: Only a little  Relationships  . Social connections:    Talks on phone: More than three times a week    Gets  together: Once a week    Attends religious service: More than 4 times per year    Active member of club or organization: Yes    Attends meetings of clubs or organizations: More than 4 times per year    Relationship status: Widowed  . Intimate partner violence:    Fear of current or ex partner: No    Emotionally abused: No    Physically abused: No    Forced sexual activity: No  Other Topics Concern  . Not on file  Social History Narrative   Widow, but remarried Dec 8th 2018     Current Outpatient Medications:  .  aspirin 81 MG tablet, Take 81 mg by mouth daily., Disp: , Rfl:  .  atorvastatin (LIPITOR) 20 MG tablet, Take 1 tablet (20 mg total) by mouth daily., Disp: 90 tablet, Rfl: 1 .  Calcium Carb-Cholecalciferol (CALCIUM 1000 + D PO), Take by mouth., Disp: , Rfl:  .  Cetirizine HCl (ZYRTEC ALLERGY) 10 MG CAPS, Take 1 capsule (10 mg total) by mouth daily., Disp: 90 capsule, Rfl: 1 .  DULoxetine (CYMBALTA) 60 MG capsule, Take 1 capsule (60 mg total) by mouth daily., Disp: 90 capsule, Rfl: 1 .  estradiol (ESTRACE) 0.5 MG tablet, Take 1 tablet (0.5 mg total) by mouth daily., Disp: 90 tablet, Rfl: 1 .  fluticasone (FLONASE) 50 MCG/ACT nasal spray, Place 2 sprays into both nostrils daily., Disp: 48 g, Rfl: 1 .  levothyroxine (SYNTHROID, LEVOTHROID) 50 MCG tablet, Take 1 tablet (50 mcg total) by mouth daily before breakfast., Disp: 90 tablet, Rfl: 0 .  omeprazole (PRILOSEC) 20 MG capsule, Take 1 capsule (20 mg total) by mouth daily., Disp: 30 capsule, Rfl: 11 .  traZODone (DESYREL) 50 MG tablet, Take 0.5-1 tablets (25-50 mg total) by mouth at bedtime as needed for sleep., Disp: 90 tablet, Rfl: 1  Allergies  Allergen Reactions  . Sulfamethoxazole-Trimethoprim Other (See Comments) and Rash  . Citalopram Itching  . Tetracycline Other (See Comments)    Other Reaction: URTICARIA  . Tetracyclines & Related Itching  . Codeine Itching    I personally reviewed active problem list, medication  list, allergies, family history, social history with the patient/caregiver today.   ROS  Constitutional: Negative for fever or weight change.  Respiratory: positive  for cough and shortness of breath.   Cardiovascular: Negative for chest pain or palpitations.  Gastrointestinal: Negative for abdominal pain, no bowel changes.  Musculoskeletal: Negative for gait problem or joint swelling.  Skin: Negative for rash.  Neurological: Negative for dizziness or headache.  No other specific complaints in a complete review of systems (except as listed in HPI above).  Objective  Vitals:   11/02/18 1025  BP: 120/78  Pulse:  92  Resp: 16  Temp: (!) 97.4 F (36.3 C)  TempSrc: Oral  SpO2: 97%  Weight: 199 lb 11.2 oz (90.6 kg)  Height: '5\' 7"'  (1.702 m)    Body mass index is 31.28 kg/m.  Physical Exam  Constitutional: Patient appears well-developed and well-nourished. Obese  No distress.  HEENT: head atraumatic, normocephalic, pupils equal and reactive to light,  neck supple, throat within normal limits Cardiovascular: Normal rate, regular rhythm and normal heart sounds.  No murmur heard. No BLE edema. Pulmonary/Chest: Effort normal and breath sounds normal. No respiratory distress. Abdominal: Soft.  There is no tenderness. Psychiatric: Patient has a normal mood and affect. behavior is normal. Judgment and thought content normal.   PHQ2/9: Depression screen [facility] 2/9 11/02/2018 08/02/2018 07/20/2018 06/02/2018 04/29/2018  Decreased Interest 0 0 0 0 0  Down, Depressed, Hopeless 0 0 0 0 0  PHQ - 2 Score 0 0 0 0 0  Altered sleeping 1 1 0 1 1  Tired, decreased energy 0 1 0 1 1  Change in appetite 1 0 0 0 0  Feeling bad or failure about yourself  0 0 0 0 0  Trouble concentrating 0 0 0 0 0  Moving slowly or fidgety/restless 0 0 0 0 0  Suicidal thoughts 0 0 0 0 0  PHQ-9 Score 2 2 0 2 2  Difficult doing work/chores Not difficult at all - - Not difficult at all Not difficult at all   Reviewed phq  9 , taking medication for depression and stable  Fall Risk: Fall Risk  11/02/2018 08/02/2018 07/20/2018 06/02/2018 04/29/2018  Falls in the past year? 1 0 0 No No  Number falls in past yr: 0 0 0 - -  Injury with Fall? 1 - - - -  Follow up Falls evaluation completed - - - -   It was a mis-stepped back to baseline now   Functional Status Survey: Is the patient deaf or have difficulty hearing?: No Does the patient have difficulty seeing, even when wearing glasses/contacts?: Yes Does the patient have difficulty concentrating, remembering, or making decisions?: No Does the patient have difficulty walking or climbing stairs?: No Does the patient have difficulty dressing or bathing?: No Does the patient have difficulty doing errands alone such as visiting a doctor's office or shopping?: No    Assessment & Plan  1. Pure hypercholesterolemia  - atorvastatin (LIPITOR) 20 MG tablet; Take 1 tablet (20 mg total) by mouth daily.  Dispense: 90 tablet; Refill: 1  2. Major depression in remission (HCC)  - DULoxetine (CYMBALTA) 60 MG capsule; Take 1 capsule (60 mg total) by mouth daily.  Dispense: 90 capsule; Refill: 1  3. Primary insomnia  - traZODone (DESYREL) 50 MG tablet; Take 0.5-1 tablets (25-50 mg total) by mouth at bedtime as needed for sleep.  Dispense: 90 tablet; Refill: 1  4. Chronic cough  - Cetirizine HCl (ZYRTEC ALLERGY) 10 MG CAPS; Take 1 capsule (10 mg total) by mouth daily.  Dispense: 90 capsule; Refill: 1  5. Multinodular goiter  Under the care of Dr. Honor Junes  6. Stage 3 chronic kidney disease (HCC)  Stable   7. Obesity (BMI 30.0-34.9)  Discussed with the patient the risk posed by an increased BMI. Discussed importance of portion control, calorie counting and at least 150 minutes of physical activity weekly. Avoid sweet beverages and drink more water. Eat at least 6 servings of fruit and vegetables daily   8. Vitamin D deficiency  Resume Vitamin D  9. Primary  osteoarthritis of both knees  Had a flare after a fall, but doing better now

## 2018-11-08 ENCOUNTER — Other Ambulatory Visit: Payer: Self-pay | Admitting: Family Medicine

## 2018-11-08 DIAGNOSIS — R05 Cough: Secondary | ICD-10-CM

## 2018-11-08 DIAGNOSIS — R053 Chronic cough: Secondary | ICD-10-CM

## 2018-11-08 MED ORDER — CETIRIZINE HCL 10 MG PO CAPS: 1.0000 | ORAL_CAPSULE | Freq: Every day | ORAL | 1 refills | Status: DC

## 2018-11-10 MED ORDER — CETIRIZINE HCL 10 MG PO CAPS: 1.0000 | ORAL_CAPSULE | Freq: Every day | ORAL | 1 refills | Status: DC

## 2018-11-10 NOTE — Progress Notes (Unsigned)
Transmission failed. Please send again.

## 2018-12-22 DIAGNOSIS — E042 Nontoxic multinodular goiter: Secondary | ICD-10-CM | POA: Diagnosis not present

## 2018-12-22 DIAGNOSIS — E890 Postprocedural hypothyroidism: Secondary | ICD-10-CM | POA: Diagnosis not present

## 2019-02-23 DIAGNOSIS — E042 Nontoxic multinodular goiter: Secondary | ICD-10-CM | POA: Diagnosis not present

## 2019-02-23 DIAGNOSIS — E890 Postprocedural hypothyroidism: Secondary | ICD-10-CM | POA: Diagnosis not present

## 2019-04-29 DIAGNOSIS — Z20828 Contact with and (suspected) exposure to other viral communicable diseases: Secondary | ICD-10-CM | POA: Diagnosis not present

## 2019-05-06 ENCOUNTER — Ambulatory Visit (INDEPENDENT_AMBULATORY_CARE_PROVIDER_SITE_OTHER): Payer: Medicare Other | Admitting: Family Medicine

## 2019-05-06 ENCOUNTER — Other Ambulatory Visit: Payer: Self-pay

## 2019-05-06 ENCOUNTER — Encounter: Payer: Self-pay | Admitting: Family Medicine

## 2019-05-06 VITALS — BP 126/72 | HR 68 | Temp 95.0°F | Resp 16 | Ht 67.0 in | Wt 198.7 lb

## 2019-05-06 DIAGNOSIS — F5101 Primary insomnia: Secondary | ICD-10-CM | POA: Diagnosis not present

## 2019-05-06 DIAGNOSIS — E7800 Pure hypercholesterolemia, unspecified: Secondary | ICD-10-CM | POA: Diagnosis not present

## 2019-05-06 DIAGNOSIS — R053 Chronic cough: Secondary | ICD-10-CM

## 2019-05-06 DIAGNOSIS — F325 Major depressive disorder, single episode, in full remission: Secondary | ICD-10-CM

## 2019-05-06 DIAGNOSIS — Z23 Encounter for immunization: Secondary | ICD-10-CM

## 2019-05-06 DIAGNOSIS — E042 Nontoxic multinodular goiter: Secondary | ICD-10-CM

## 2019-05-06 DIAGNOSIS — N183 Chronic kidney disease, stage 3 unspecified: Secondary | ICD-10-CM

## 2019-05-06 DIAGNOSIS — F331 Major depressive disorder, recurrent, moderate: Secondary | ICD-10-CM

## 2019-05-06 DIAGNOSIS — R05 Cough: Secondary | ICD-10-CM

## 2019-05-06 DIAGNOSIS — M170 Bilateral primary osteoarthritis of knee: Secondary | ICD-10-CM

## 2019-05-06 DIAGNOSIS — E559 Vitamin D deficiency, unspecified: Secondary | ICD-10-CM

## 2019-05-06 MED ORDER — FLUTICASONE PROPIONATE 50 MCG/ACT NA SUSP: 2.0000 | Freq: Every day | NASAL | 1 refills | Status: DC

## 2019-05-06 MED ORDER — DULOXETINE HCL 60 MG PO CPEP: 60.0000 mg | ORAL_CAPSULE | Freq: Every day | ORAL | 1 refills | Status: DC

## 2019-05-06 MED ORDER — ATORVASTATIN CALCIUM 20 MG PO TABS: 20.0000 mg | ORAL_TABLET | Freq: Every day | ORAL | 1 refills | Status: DC

## 2019-05-06 NOTE — Progress Notes (Signed)
Name: Lauren Johnston   MRN: [id number]    DOB: [birth year]   Date:05/06/2019       Progress Note  Subjective  Chief Complaint  Chief Complaint  Patient presents with  . Medication Refill    Can she take Biotin (Hair and nail vitamin)  . Depression    Taking care of her sister with Dementia  . Hyperlipidemia  . Obesity    Has lost 1 pound due to stress  . Insomnia    Trazodone can affect memory and worried about taking it. Since Dementia runs in her family  . CKI Stage III    HPI  Chronic cough: going on for about one year now, seen by Dr. Rosine Abe, using nasal spray, zyrtec and omeprazole, but still has a nocturnal cough and sputum production. On Dr. Jeronimo Greaves note next step was to get CT chest, reminded her to follow up with him    Hyperlipidemia: taking Atorvastatin , reviewed labs and at goal, no myalgia. Unchanged   OA both knees: stable, taking tylenol prn now, she states she likes to work on her garden and flowers   Insomnia/Memory problems: she takes trazodone occasionally, she is afraid of affecting her memory. She states there is a lot of dementia and she is worried about it. Discussed coming back to have MMS or refer to neurologist    CKI stage III: discussed avoiding NSAID's and drink more water, taking Tylenol prn , reviewed last labs   Depression:She used to live in [place] but they moved to [place] in 2018, she was living independently until Feb 2020, she was moved to [facility] but since [place] she has been feeling isolated and they will move her [place]. It makes her feel angry because she just finished carrying for her mother that had dementia. She states other family member help but she is the primary caregiver since she is the only that does not work. She is taking Duloxetine but not want to take anything else. Discussed therapy, she states she went to therapy when her mother died and is considering going back. There is a lot of resentment,  because she feels like the sister that has dementia also provided for another niece. She gets upset about it.   Patient Active Problem List   Diagnosis Date Noted  . Class 1 obesity in adult 11/11/2016  . Osteopenia 10/09/2016  . Special screening for malignant neoplasms, colon   . Rectal polyp   . Right wrist tendonitis 05/16/2016  . Osteoarthritis of left hip 05/16/2016  . Vitamin D deficiency 02/05/2016  . Cold sore 02/05/2016  . Degeneration of intervertebral disc of cervical region 02/05/2016  . Insomnia 04/23/2015  . Major depression in remission ([place]) 04/23/2015  . Chronic pain 04/23/2015  . Goiter, nontoxic, 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  . 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 Uterine leiomyoma 06/16/2007    Past Surgical History:  Procedure Laterality Date  . ABDOMINAL HYSTERECTOMY    . BILATERAL OOPHORECTOMY    . COLONOSCOPY WITH PROPOFOL N/A 06/02/2016   Procedure: COLONOSCOPY WITH PROPOFOL;  Surgeon: Lucilla Lame, MD;  Location: [place];  Service: Endoscopy;  Laterality: N/A;  . dequavian tendonitis    . KNEE ARTHROSCOPY    . POLYPECTOMY  06/02/2016   Procedure: POLYPECTOMY;  Surgeon: Lucilla Lame, MD;  Location: [place];  Service: Endoscopy;;    Family History  Problem Relation Age of Onset  . Diabetes Mother   . Heart disease Mother   .  Emphysema Father   . Heart disease Father   . COPD Father   . Multiple myeloma Brother   . Alzheimer's disease Sister   . Dementia Sister     Social History   Socioeconomic History  . Marital status: Married    Spouse name: Not on file  . Number of children: 3  . Years of education: Not on file  . Highest education level: Some college, no degree  Occupational History  . Occupation: retired  Scientific laboratory technician  . Financial resource strain: Not hard at all  . Food insecurity    Worry: Never true    Inability: Never true  . Transportation needs    Medical: No    Non-medical: No  Tobacco Use  .  Smoking status: Never Smoker  . Smokeless tobacco: Never Used  Substance and Sexual Activity  . Alcohol use: No    Alcohol/week: 0.0 standard drinks  . Drug use: No  . Sexual activity: Yes    Partners: Male    Birth control/protection: Post-menopausal    Comment: Hysterectomy  Lifestyle  . Physical activity    Days per week: 0 days    Minutes per session: 0 min  . Stress: Only a little  Relationships  . Social connections    Talks on phone: More than three times a week    Gets together: Once a week    Attends religious service: More than 4 times per year    Active member of club or organization: Yes    Attends meetings of clubs or organizations: More than 4 times per year    Relationship status: Widowed  . Intimate partner violence    Fear of current or ex partner: No    Emotionally abused: No    Physically abused: No    Forced sexual activity: No  Other Topics Concern  . Not on file  Social History Narrative   Widow, but remarried Dec 8th 2018     Current Outpatient Medications:  .  aspirin 81 MG tablet, Take 81 mg by mouth daily., Disp: , Rfl:  .  atorvastatin (LIPITOR) 20 MG tablet, Take 1 tablet (20 mg total) by mouth daily., Disp: 90 tablet, Rfl: 1 .  Calcium Carb-Cholecalciferol (CALCIUM 1000 + D PO), Take by mouth., Disp: , Rfl:  .  Cetirizine HCl (ZYRTEC ALLERGY) 10 MG CAPS, Take 1 capsule (10 mg total) by mouth daily., Disp: 90 capsule, Rfl: 1 .  DULoxetine (CYMBALTA) 60 MG capsule, Take 1 capsule (60 mg total) by mouth daily., Disp: 90 capsule, Rfl: 1 .  estradiol (ESTRACE) 0.5 MG tablet, Take 1 tablet (0.5 mg total) by mouth daily., Disp: 90 tablet, Rfl: 1 .  fluticasone (FLONASE) 50 MCG/ACT nasal spray, Place 2 sprays into both nostrils daily., Disp: 48 g, Rfl: 1 .  levothyroxine (SYNTHROID, LEVOTHROID) 50 MCG tablet, Take 1 tablet (50 mcg total) by mouth daily before breakfast., Disp: 90 tablet, Rfl: 0 .  omeprazole (PRILOSEC) 20 MG capsule, Take 1 capsule (20  mg total) by mouth daily., Disp: 30 capsule, Rfl: 11 .  traZODone (DESYREL) 50 MG tablet, Take 0.5-1 tablets (25-50 mg total) by mouth at bedtime as needed for sleep. (Patient not taking: Reported on 05/06/2019), Disp: 90 tablet, Rfl: 1  Allergies  Allergen Reactions  . Sulfamethoxazole-Trimethoprim Other (See Comments) and Rash  . Citalopram Itching  . Tetracycline Other (See Comments)    Other Reaction: URTICARIA  . Tetracyclines & Related Itching  . Codeine Itching  I personally reviewed active problem list, medication list, allergies, family history, social history with the patient/caregiver today.   ROS  Constitutional: Negative for fever or weight change.  Respiratory: Negative for cough and shortness of breath.   Cardiovascular: Negative for chest pain or palpitations.  Gastrointestinal: Negative for abdominal pain, no bowel changes.  Musculoskeletal: Negative for gait problem or joint swelling.  Skin: Negative for rash.  Neurological: Negative for dizziness or headache.  No other specific complaints in a complete review of systems (except as listed in HPI above).  Objective  Vitals:   05/06/19 1042  BP: 126/72  Pulse: 68  Resp: 16  Temp: (!) 95 F (35 C)  TempSrc: Temporal  SpO2: 98%  Weight: 198 lb 11.2 oz (90.1 kg)  Height: _0  (1.702 m)    Body mass index is 31.12 kg/m.  Physical Exam  Constitutional: Patient appears well-developed and well-nourished. Obese No distress.  HEENT: head atraumatic, normocephalic, pupils equal and reactive to light Cardiovascular: Normal rate, regular rhythm and normal heart sounds.  No murmur heard. No BLE edema. Pulmonary/Chest: Effort normal and breath sounds normal. No respiratory distress. Abdominal: Soft.  There is no tenderness. Psychiatric: Patient has a normal mood and affect. behavior is normal. Judgment and thought content normal.  PHQ2/9: Depression screen [facility] 2/9 05/06/2019 11/02/2018 08/02/2018 07/20/2018  06/02/2018  Decreased Interest 2 0 0 0 0  Down, Depressed, Hopeless 2 0 0 0 0  PHQ - 2 Score 4 0 0 0 0  Altered sleeping _1 0 1  Tired, decreased energy 2 0 1 0 1  Change in appetite 2 1 0 0 0  Feeling bad or failure about yourself  0 0 0 0 0  Trouble concentrating 0 0 0 0 0  Moving slowly or fidgety/restless 0 0 0 0 0  Suicidal thoughts 0 0 0 0 0  PHQ-9 Score _2 0 2  Difficult doing work/chores Somewhat difficult Not difficult at all - - Not difficult at all  Some recent data might be hidden    phq 9 is positive   Fall Risk: Fall Risk  05/06/2019 11/02/2018 08/02/2018 07/20/2018 06/02/2018  Falls in the past year? 0 1 0 0 No  Number falls in past yr: 0 0 0 0 -  Injury with Fall? 0 1 - - -  Follow up - Falls evaluation completed - - -     Functional Status Survey: Is the patient deaf or have difficulty hearing?: No Does the patient have difficulty seeing, even when wearing glasses/contacts?: Yes Does the patient have difficulty concentrating, remembering, or making decisions?: No Does the patient have difficulty walking or climbing stairs?: No Does the patient have difficulty dressing or bathing?: No Does the patient have difficulty doing errands alone such as visiting a doctor's office or shopping?: No    Assessment & Plan  1. Moderate episode of recurrent major depressive disorder [facility])  Discussed she has a lot of resentment, she needs to get therapy or not to take care of her sister since it is making her feel sad and angry   - DULoxetine (CYMBALTA) 60 MG capsule; Take 1 capsule (60 mg total) by mouth daily.  Dispense: 90 capsule; Refill: 1  2. Primary insomnia   3. Multinodular goiter  Continue follow up with Dr. Honor Junes   4. Pure hypercholesterolemia  - atorvastatin (LIPITOR) 20 MG tablet; Take 1 tablet (20 mg total) by mouth daily.  Dispense: 90 tablet; Refill: 1  5. Stage 3 chronic kidney disease (HCC)  Stable, avoid nsaid's  6. Primary  osteoarthritis of both knees   7. Vitamin D deficiency  Normal vitamin D supplementation   6. Primary osteoarthritis of both knees  Doing better  7. Vitamin D deficiency  Take vitamin D supplementation otc  8. Need for immunization against influenza  - Flu Vaccine QUAD High Dose(Fluad)  9. Chronic cough  - fluticasone (FLONASE) 50 MCG/ACT nasal spray; Place 2 sprays into both nostrils daily.  Dispense: 48 g; Refill: 1

## 2019-06-02 ENCOUNTER — Other Ambulatory Visit: Payer: Self-pay | Admitting: Family Medicine

## 2019-06-02 DIAGNOSIS — Z1231 Encounter for screening mammogram for malignant neoplasm of breast: Secondary | ICD-10-CM

## 2019-06-07 ENCOUNTER — Ambulatory Visit: Payer: Medicare Other | Admitting: Family Medicine

## 2019-06-15 DIAGNOSIS — Z20828 Contact with and (suspected) exposure to other viral communicable diseases: Secondary | ICD-10-CM | POA: Diagnosis not present

## 2019-07-25 ENCOUNTER — Telehealth: Payer: Self-pay | Admitting: Family Medicine

## 2019-07-25 DIAGNOSIS — E890 Postprocedural hypothyroidism: Secondary | ICD-10-CM

## 2019-07-25 NOTE — Telephone Encounter (Signed)
-----   Message from Steele Sizer, MD sent at 07/25/2019  1:34 PM EST ----- She sees Endo , and had labs done in June at his office, please ask her to contact Dr. Honor Junes for a refill. I am not the one managing her thyroid.  Disregard previous message

## 2019-07-25 NOTE — Telephone Encounter (Signed)
Patient requesting levothyroxine (SYNTHROID, LEVOTHROID) 50 MCG tablet , informed patient please allow 48 to 72 hour turn around time, patient states she has 1 pill left

## 2019-07-25 NOTE — Telephone Encounter (Signed)
Pt understands and will call Endo for refill

## 2019-07-26 ENCOUNTER — Ambulatory Visit (INDEPENDENT_AMBULATORY_CARE_PROVIDER_SITE_OTHER): Payer: Medicare Other

## 2019-07-26 VITALS — Ht 67.0 in | Wt 191.0 lb

## 2019-07-26 DIAGNOSIS — M8580 Other specified disorders of bone density and structure, unspecified site: Secondary | ICD-10-CM

## 2019-07-26 DIAGNOSIS — Z0000 Encounter for general adult medical examination without abnormal findings: Secondary | ICD-10-CM

## 2019-07-26 DIAGNOSIS — Z780 Asymptomatic menopausal state: Secondary | ICD-10-CM

## 2019-07-26 NOTE — Patient Instructions (Signed)
Ms. Cann , Thank you for taking time to come for your Medicare Wellness Visit. I appreciate your ongoing commitment to your health goals. Please review the following plan we discussed and let me know if I can assist you in the future.   Screening recommendations/referrals: Colonoscopy: done 06/02/16. Repeat in 2022. Mammogram: done 08/30/18. Scheduled for 09/01/19 Bone Density: done 10/09/16. Please call [phone] to schedule your bone density screening.  Recommended yearly ophthalmology/optometry visit for glaucoma screening and checkup Recommended yearly dental visit for hygiene and checkup  Vaccinations: Influenza vaccine: done 05/06/19 Pneumococcal vaccine: done 07/17/17 Tdap vaccine: done 08/11/10 Shingles vaccine: Shingrix discussed. Please contact your pharmacy for coverage information.   Advanced directives: Please bring a copy of your health care power of attorney and living will to the office at your convenience.  Conditions/risks identified: Keep up the great work!  Next appointment: Please follow up in one year for your Medicare Annual Wellness visit.     Preventive Care 52 Years and Older, Female Preventive care refers to lifestyle choices and visits with your health care provider that can promote health and wellness. What does preventive care include?  A yearly physical exam. This is also called an annual well check.  Dental exams once or twice a year.  Routine eye exams. Ask your health care provider how often you should have your eyes checked.  Personal lifestyle choices, including:  Daily care of your teeth and gums.  Regular physical activity.  Eating a healthy diet.  Avoiding tobacco and drug use.  Limiting alcohol use.  Practicing safe sex.  Taking low-dose aspirin every day.  Taking vitamin and mineral supplements as recommended by your health care provider. What happens during an annual well check? The services and screenings done by your  health care provider during your annual well check will depend on your age, overall health, lifestyle risk factors, and family history of disease. Counseling  Your health care provider may ask you questions about your:  Alcohol use.  Tobacco use.  Drug use.  Emotional well-being.  Home and relationship well-being.  Sexual activity.  Eating habits.  History of falls.  Memory and ability to understand (cognition).  Work and work Statistician.  Reproductive health. Screening  You may have the following tests or measurements:  Height, weight, and BMI.  Blood pressure.  Lipid and cholesterol levels. These may be checked every 5 years, or more frequently if you are over 12 years old.  Skin check.  Lung cancer screening. You may have this screening every year starting at age 24 if you have a 30-pack-year history of smoking and currently smoke or have quit within the past 15 years.  Fecal occult blood test (FOBT) of the stool. You may have this test every year starting at age 65.  Flexible sigmoidoscopy or colonoscopy. You may have a sigmoidoscopy every 5 years or a colonoscopy every 10 years starting at age 7.  Hepatitis C blood test.  Hepatitis B blood test.  Sexually transmitted disease (STD) testing.  Diabetes screening. This is done by checking your blood sugar (glucose) after you have not eaten for a while (fasting). You may have this done every 1-3 years.  Bone density scan. This is done to screen for osteoporosis. You may have this done starting at age 52.  Mammogram. This may be done every 1-2 years. Talk to your health care provider about how often you should have regular mammograms. Talk with your health care provider about your test results,  treatment options, and if necessary, the need for more tests. Vaccines  Your health care provider may recommend certain vaccines, such as:  Influenza vaccine. This is recommended every year.  Tetanus, diphtheria, and  acellular pertussis (Tdap, Td) vaccine. You may need a Td booster every 10 years.  Zoster vaccine. You may need this after age 74.  Pneumococcal 13-valent conjugate (PCV13) vaccine. One dose is recommended after age 55.  Pneumococcal polysaccharide (PPSV23) vaccine. One dose is recommended after age 59. Talk to your health care provider about which screenings and vaccines you need and how often you need them. This information is not intended to replace advice given to you by your health care provider. Make sure you discuss any questions you have with your health care provider. Document Released: 09/14/2015 Document Revised: 05/07/2016 Document Reviewed: 06/19/2015 Elsevier Interactive Patient Education  2017 [employer] Prevention in the Home Falls can cause injuries. They can happen to people of all ages. There are many things you can do to make your home safe and to help prevent falls. What can I do on the outside of my home?  Regularly fix the edges of walkways and driveways and fix any cracks.  Remove anything that might make you trip as you walk through a door, such as a raised step or threshold.  Trim any bushes or trees on the path to your home.  Use bright outdoor lighting.  Clear any walking paths of anything that might make someone trip, such as rocks or tools.  Regularly check to see if handrails are loose or broken. Make sure that both sides of any steps have handrails.  Any raised decks and porches should have guardrails on the edges.  Have any leaves, snow, or ice cleared regularly.  Use sand or salt on walking paths during winter.  Clean up any spills in your garage right away. This includes oil or grease spills. What can I do in the bathroom?  Use night lights.  Install grab bars by the toilet and in the tub and shower. Do not use towel bars as grab bars.  Use non-skid mats or decals in the tub or shower.  If you need to sit down in the shower, use  a plastic, non-slip stool.  Keep the floor dry. Clean up any water that spills on the floor as soon as it happens.  Remove soap buildup in the tub or shower regularly.  Attach bath mats securely with double-sided non-slip rug tape.  Do not have throw rugs and other things on the floor that can make you trip. What can I do in the bedroom?  Use night lights.  Make sure that you have a light by your bed that is easy to reach.  Do not use any sheets or blankets that are too big for your bed. They should not hang down onto the floor.  Have a firm chair that has side arms. You can use this for support while you get dressed.  Do not have throw rugs and other things on the floor that can make you trip. What can I do in the kitchen?  Clean up any spills right away.  Avoid walking on wet floors.  Keep items that you use a lot in easy-to-reach places.  If you need to reach something above you, use a strong step stool that has a grab bar.  Keep electrical cords out of the way.  Do not use floor polish or wax that makes floors  slippery. If you must use wax, use non-skid floor wax.  Do not have throw rugs and other things on the floor that can make you trip. What can I do with my stairs?  Do not leave any items on the stairs.  Make sure that there are handrails on both sides of the stairs and use them. Fix handrails that are broken or loose. Make sure that handrails are as long as the stairways.  Check any carpeting to make sure that it is firmly attached to the stairs. Fix any carpet that is loose or worn.  Avoid having throw rugs at the top or bottom of the stairs. If you do have throw rugs, attach them to the floor with carpet tape.  Make sure that you have a light switch at the top of the stairs and the bottom of the stairs. If you do not have them, ask someone to add them for you. What else can I do to help prevent falls?  Wear shoes that:  Do not have high heels.  Have  rubber bottoms.  Are comfortable and fit you well.  Are closed at the toe. Do not wear sandals.  If you use a stepladder:  Make sure that it is fully opened. Do not climb a closed stepladder.  Make sure that both sides of the stepladder are locked into place.  Ask someone to hold it for you, if possible.  Clearly mark and make sure that you can see:  Any grab bars or handrails.  First and last steps.  Where the edge of each step is.  Use tools that help you move around (mobility aids) if they are needed. These include:  Canes.  Walkers.  Scooters.  Crutches.  Turn on the lights when you go into a dark area. Replace any light bulbs as soon as they burn out.  Set up your furniture so you have a clear path. Avoid moving your furniture around.  If any of your floors are uneven, fix them.  If there are any pets around you, be aware of where they are.  Review your medicines with your doctor. Some medicines can make you feel dizzy. This can increase your chance of falling. Ask your doctor what other things that you can do to help prevent falls. This information is not intended to replace advice given to you by your health care provider. Make sure you discuss any questions you have with your health care provider. Document Released: 06/14/2009 Document Revised: 01/24/2016 Document Reviewed: 09/22/2014 Elsevier Interactive Patient Education  2017 [employer].

## 2019-07-26 NOTE — Progress Notes (Signed)
Subjective:   Lauren Johnston is a 68 y.o. female who presents for Medicare Annual (Subsequent) preventive examination.  Virtual Visit via Video Note  I connected with Lauren Johnston on 07/26/19 at 10:00 AM EST by a video enabled telemedicine application and verified that I am speaking with the correct person using two identifiers.  Location: Patient: home Provider: office   I discussed the limitations of evaluation and management by telemedicine and the availability of in person appointments. The patient expressed understanding and agreed to proceed.  Some vital signs may be absent or patient reported.   Clemetine Marker, LPN   Review of Systems:   Cardiac Risk Factors include: advanced age (>59mn, >>76women);dyslipidemia     Objective:     Vitals: Ht '5\' 7"'  (1.702 m)   Wt 191 lb (86.6 kg)   BMI 29.91 kg/m   Body mass index is 29.91 kg/m.  Advanced Directives 07/26/2019 07/20/2018 05/14/2017 05/06/2017 11/11/2016 07/09/2016 06/02/2016  Does Patient Have a Medical Advance Directive? Yes Yes No No No No No  Type of AParamedicof AFort [place] will [place] will - - - - -  Copy of [place] Chart? No - copy requested No - copy requested - - - - -  Would patient like information on creating a medical advance directive? - - - - - No - patient declined information No - patient declined information    Tobacco Social History   Tobacco Use  Smoking Status Never Smoker  Smokeless Tobacco Never Used     Counseling given: Not Answered   Clinical Intake:  Pre-visit preparation completed: Yes  Pain : No/denies pain     BMI - recorded: 29.91 Nutritional Status: BMI 25 -29 Overweight Nutritional Risks: None Diabetes: No  How often do you need to have someone help you when you read instructions, pamphlets, or other written materials from your doctor or pharmacy?: 1 - Never  Interpreter Needed?: No   Information entered by :: KClemetine MarkerLPN  Past Medical History:  Diagnosis Date  . Arthritis    hips  . Dental crowns present    dental implants - upper  . Depression   . GERD (gastroesophageal reflux disease)   . Hyperlipidemia   . Hypothyroidism   . Thyroid disease    Past Surgical History:  Procedure Laterality Date  . ABDOMINAL HYSTERECTOMY    . BILATERAL OOPHORECTOMY    . COLONOSCOPY WITH PROPOFOL N/A 06/02/2016   Procedure: COLONOSCOPY WITH PROPOFOL;  Surgeon: DLucilla Lame MD;  [facility]  Service: Endoscopy;  Laterality: N/A;  . dequavian tendonitis    . KNEE ARTHROSCOPY    . POLYPECTOMY  06/02/2016   Procedure: POLYPECTOMY;  Surgeon: DLucilla Lame MD;  [facility]  Service: Endoscopy;;   Family History  Problem Relation Age of Onset  . Diabetes Mother   . Heart disease Mother   . Emphysema Father   . Heart disease Father   . COPD Father   . Multiple myeloma Brother   . Alzheimer's disease Sister   . Dementia Sister    Social History   Socioeconomic History  . Marital status: Married    Spouse name: Not on file  . Number of children: 3  . Years of education: Not on file  . Highest education level: Some college, no degree  Occupational History  . Occupation: retired  SScientific laboratory technician . Financial resource strain: Not  hard at all  . Food insecurity    Worry: Never true    Inability: Never true  . Transportation needs    Medical: No    Non-medical: No  Tobacco Use  . Smoking status: Never Smoker  . Smokeless tobacco: Never Used  Substance and Sexual Activity  . Alcohol use: No    Alcohol/week: 0.0 standard drinks  . Drug use: No  . Sexual activity: Yes    Partners: Male    Birth control/protection: Post-menopausal    Comment: Hysterectomy  Lifestyle  . Physical activity    Days per week: 3 days    Minutes per session: 30 min  . Stress: Only a little  Relationships  . Social connections    Talks on phone:  More than three times a week    Gets together: Once a week    Attends religious service: More than 4 times per year    Active member of club or organization: Yes    Attends meetings of clubs or organizations: More than 4 times per year    Relationship status: Widowed  Other Topics Concern  . Not on file  Social History Narrative   [name], but remarried Dec 8th 2018    Outpatient Encounter Medications as of 07/26/2019  Medication Sig  . aspirin 81 MG tablet Take 81 mg by mouth daily.  Marland Kitchen atorvastatin (LIPITOR) 20 MG tablet Take 1 tablet (20 mg total) by mouth daily.  . Biotin w/ Vitamins C & E (HAIR SKIN & NAILS GUMMIES PO) Take by mouth. Taking every other day  . Calcium Carb-Cholecalciferol (CALCIUM 1000 + D PO) Take by mouth. Taking every other day  . DULoxetine (CYMBALTA) 60 MG capsule Take 1 capsule (60 mg total) by mouth daily.  Marland Kitchen estradiol (ESTRACE) 0.5 MG tablet Take 1 tablet (0.5 mg total) by mouth daily.  . fluticasone (FLONASE) 50 MCG/ACT nasal spray Place 2 sprays into both nostrils daily. (Patient taking differently: Place 2 sprays into both nostrils daily. PRN only)  . levothyroxine (SYNTHROID, LEVOTHROID) 50 MCG tablet Take 1 tablet (50 mcg total) by mouth daily before breakfast.  . omeprazole (PRILOSEC) 20 MG capsule Take 1 capsule (20 mg total) by mouth daily.  . traZODone (DESYREL) 50 MG tablet Take 0.5-1 tablets (25-50 mg total) by mouth at bedtime as needed for sleep.  . Cetirizine HCl (ZYRTEC ALLERGY) 10 MG CAPS Take 1 capsule (10 mg total) by mouth daily. (Patient not taking: Reported on 07/26/2019)   No facility-administered encounter medications on file as of 07/26/2019.     Activities of Daily Living In your present state of health, do you have any difficulty performing the following activities: 07/26/2019 05/06/2019  Hearing? N N  Comment declines hearing aids -  Vision? N Y  Comment - -  Difficulty concentrating or making decisions? Y N  Walking or climbing  stairs? N N  Dressing or bathing? N N  Doing errands, shopping? N N  Preparing Food and eating ? N -  Using the Toilet? N -  In the past six months, have you accidently leaked urine? N -  Do you have problems with loss of bowel control? N -  Managing your Medications? N -  Managing your Finances? N -  Housekeeping or managing your Housekeeping? N -  Some recent data might be hidden    Patient Care Team: Steele Sizer, MD as PCP - General (Family Medicine) Solum, Betsey Holiday, MD as Physician Assistant (Endocrinology) Lovell Sheehan,  MD as Consulting Physician (Orthopedic Surgery)    Assessment:   This is a routine wellness examination for Lauren Johnston.  Exercise Activities and Dietary recommendations Current Exercise Habits: Home exercise routine, Type of exercise: walking, Time (Minutes): 30, Frequency (Times/Week): 3, Weekly Exercise (Minutes/Week): 90, Exercise limited by: orthopedic condition(s)  Goals    . Increase physical activity     Increase physical activity to 150 minutes per week       Fall Risk Fall Risk  07/26/2019 05/06/2019 11/02/2018 08/02/2018 07/20/2018  Falls in the past year? 0 0 1 0 0  Number falls in past yr: 0 0 0 0 0  Injury with Fall? 0 0 1 - -  Follow up Falls prevention discussed - Falls evaluation completed - -   FALL RISK PREVENTION PERTAINING TO THE HOME:  Any stairs in or around the home? Yes  If so, do they handrails? No  - 2 steps down into [facility] free of loose throw rugs in walkways, pet beds, electrical cords, etc? Yes  Adequate lighting in your home to reduce risk of falls? Yes   ASSISTIVE DEVICES UTILIZED TO PREVENT FALLS:  Life alert? No  Use of a cane, walker or w/c? No  Grab bars in the bathroom? Yes  Shower chair or bench in shower? No  Elevated toilet seat or a handicapped toilet? Yes   DME ORDERS:  DME order needed?  No   TIMED UP AND GO:  Was the test performed? No . telehealth visit.   Education: Fall risk prevention has  been discussed.  Intervention(s) required? No    Depression Screen PHQ 2/9 Scores 07/26/2019 05/06/2019 11/02/2018 08/02/2018  PHQ - 2 Score 0 4 0 0  PHQ- 9 Score - '9 2 2     ' Cognitive Function - 6CIT deferred for 2020 AWV, pt to follow up with Dr. Ancil Boozer for in office MMSE.     6CIT Screen 07/20/2018  What Year? 0 points  What month? 0 points  What time? 0 points  Count back from 20 0 points  Months in reverse 0 points  Repeat phrase 2 points  Total Score 2    Immunization History  Administered Date(s) Administered  . Fluad Quad(high Dose 65+) 05/06/2019  . Influenza Split 06/16/2007, 06/19/2008, 09/07/2008, 07/05/2009, 04/25/2010  . Influenza, High Dose Seasonal PF 05/14/2017, 06/02/2018  . Influenza, Seasonal, Injecte, Preservative Fre 06/25/2011, 05/26/2012, 05/18/2013  . Influenza,inj,Quad PF,6+ Mos 06/07/2014, 04/23/2015, 05/07/2016  . Influenza-Unspecified 06/07/2014  . Pneumococcal Conjugate-13 07/17/2017  . Pneumococcal Polysaccharide-23 02/05/2016  . Tdap 08/01/2010  . Zoster 04/07/2011    Qualifies for Shingles Vaccine? Yes  Zostavax completed 2012. Due for Shingrix. Education has been provided regarding the importance of this vaccine. Pt has been advised to call insurance company to determine out of pocket expense. Advised may also receive vaccine at local pharmacy or Health Dept. Verbalized acceptance and understanding.  Tdap: Up to date  Flu Vaccine: Up to date  Pneumococcal Vaccine: Up to date   Screening Tests Health Maintenance  Topic Date Due  . TETANUS/TDAP  08/01/2020  . MAMMOGRAM  08/30/2020  . COLONOSCOPY  06/02/2021  . INFLUENZA VACCINE  Completed  . DEXA SCAN  Completed  . Hepatitis C Screening  Completed  . PNA vac Low Risk Adult  Completed    Cancer Screenings:  Colorectal Screening: Completed done 06/02/16. Repeat every 5 years;  Mammogram: Completed 08/30/18. Repeat every year; Scheduled for 09/01/19  Bone Density: Completed  10/09/16. Results  reflect  OSTEOPENIA. Repeat every 2 years. Ordered today. Pt provided with contact information and advised to call to schedule appt.   Lung Cancer Screening: (Low Dose CT Chest recommended if Age 17-80 years, 30 pack-year currently smoking OR have quit w/in 15years.) does not qualify.   Additional Screening:  Hepatitis C Screening: does qualify; Completed 07/17/17  Vision Screening: Recommended annual ophthalmology exams for early detection of glaucoma and other disorders of the eye. Is the patient up to date with their annual eye exam?  Yes  Who is the provider or what is the name of the office in which the pt attends annual eye exams? [place]  Dental Screening: Recommended annual dental exams for proper oral hygiene  Community Resource Referral:  CRR required this visit?  No     Plan:     I have personally reviewed and addressed the Medicare Annual Wellness questionnaire and have noted the following in the patient's chart:  A. Medical and social history B. Use of alcohol, tobacco or illicit drugs  C. Current medications and supplements D. Functional ability and status E.  Nutritional status F.  Physical activity G. Advance directives H. List of other physicians I.  Hospitalizations, surgeries, and ER visits in previous 12 months J.  [place] such as hearing and vision if needed, cognitive and depression L. Referrals and appointments   In addition, I have reviewed and discussed with patient certain preventive protocols, quality metrics, and best practice recommendations. A written personalized care plan for preventive services as well as general preventive health recommendations were provided to patient.   Signed,  Clemetine Marker, LPN Nurse Health Advisor   Nurse Notes: advised pt due for yearly visit with Dr. Ancil Boozer for labs and follow up MMS and depression. Pt has concerns about memory and dementia due to strong family history and  would like to discuss possibly taking prevagen but she currently feels like she is doing well. Pt appreciative of visit today.

## 2019-08-29 ENCOUNTER — Other Ambulatory Visit: Payer: Self-pay | Admitting: Family Medicine

## 2019-08-29 DIAGNOSIS — F331 Major depressive disorder, recurrent, moderate: Secondary | ICD-10-CM

## 2019-08-29 DIAGNOSIS — F5101 Primary insomnia: Secondary | ICD-10-CM

## 2019-08-29 DIAGNOSIS — Z780 Asymptomatic menopausal state: Secondary | ICD-10-CM

## 2019-08-29 NOTE — Telephone Encounter (Signed)
Medication Refill - Medication:DULoxetine (CYMBALTA) 60 MG capsule,traZODone (DESYREL) 50 MG tablet send to [facility] and she would need a emergency refill for estradiol (ESTRACE) 0.5 MG tablet [facility] to [facility] on [address], [place]  Has the patient contacted their pharmacy? Yes.   (Agent: If no, request that the patient contact the pharmacy for the refill.) (Agent: If yes, when and what did the pharmacy advise?)  Preferred Pharmacy (with phone number or street name): [facility] [address],  for Estradiol emergency refill and [facility] [address]- for Duloxetine Agent: Please be advised that RX refills may take up to 3 business days. We ask that you follow-up with your pharmacy.

## 2019-08-30 MED ORDER — DULOXETINE HCL 60 MG PO CPEP: 60.0000 mg | ORAL_CAPSULE | Freq: Every day | ORAL | 0 refills | Status: DC

## 2019-08-30 MED ORDER — ESTRADIOL 0.5 MG PO TABS: 0.5000 mg | ORAL_TABLET | Freq: Every day | ORAL | 0 refills | Status: DC

## 2019-08-30 MED ORDER — TRAZODONE HCL 50 MG PO TABS: 25.0000 mg | ORAL_TABLET | Freq: Every evening | ORAL | 0 refills | Status: DC | PRN

## 2019-09-01 ENCOUNTER — Ambulatory Visit
Admission: RE | Admit: 2019-09-01 | Discharge: 2019-09-01 | Disposition: A | Discharge status: Home / Self Care | Payer: Medicare Other | Source: Ambulatory Visit | Attending: Family Medicine | Admitting: Family Medicine

## 2019-09-01 DIAGNOSIS — Z1231 Encounter for screening mammogram for malignant neoplasm of breast: Secondary | ICD-10-CM | POA: Diagnosis not present

## 2019-09-23 ENCOUNTER — Other Ambulatory Visit: Payer: Self-pay | Admitting: Family Medicine

## 2019-09-23 DIAGNOSIS — Z780 Asymptomatic menopausal state: Secondary | ICD-10-CM

## 2019-09-29 ENCOUNTER — Encounter: Payer: Self-pay | Admitting: Family Medicine

## 2019-09-29 ENCOUNTER — Ambulatory Visit (INDEPENDENT_AMBULATORY_CARE_PROVIDER_SITE_OTHER): Payer: Medicare Other | Admitting: Family Medicine

## 2019-09-29 ENCOUNTER — Other Ambulatory Visit: Payer: Self-pay

## 2019-09-29 DIAGNOSIS — E890 Postprocedural hypothyroidism: Secondary | ICD-10-CM

## 2019-09-29 DIAGNOSIS — E042 Nontoxic multinodular goiter: Secondary | ICD-10-CM | POA: Diagnosis not present

## 2019-09-29 DIAGNOSIS — M170 Bilateral primary osteoarthritis of knee: Secondary | ICD-10-CM

## 2019-09-29 DIAGNOSIS — E559 Vitamin D deficiency, unspecified: Secondary | ICD-10-CM

## 2019-09-29 DIAGNOSIS — M8580 Other specified disorders of bone density and structure, unspecified site: Secondary | ICD-10-CM | POA: Diagnosis not present

## 2019-09-29 DIAGNOSIS — N1831 Chronic kidney disease, stage 3a: Secondary | ICD-10-CM

## 2019-09-29 DIAGNOSIS — R05 Cough: Secondary | ICD-10-CM

## 2019-09-29 DIAGNOSIS — E7800 Pure hypercholesterolemia, unspecified: Secondary | ICD-10-CM

## 2019-09-29 DIAGNOSIS — F5101 Primary insomnia: Secondary | ICD-10-CM

## 2019-09-29 DIAGNOSIS — R739 Hyperglycemia, unspecified: Secondary | ICD-10-CM

## 2019-09-29 DIAGNOSIS — Z780 Asymptomatic menopausal state: Secondary | ICD-10-CM

## 2019-09-29 DIAGNOSIS — R053 Chronic cough: Secondary | ICD-10-CM

## 2019-09-29 DIAGNOSIS — F325 Major depressive disorder, single episode, in full remission: Secondary | ICD-10-CM

## 2019-09-29 DIAGNOSIS — F331 Major depressive disorder, recurrent, moderate: Secondary | ICD-10-CM

## 2019-09-29 MED ORDER — DULOXETINE HCL 60 MG PO CPEP: 60.0000 mg | ORAL_CAPSULE | Freq: Every day | ORAL | 1 refills | Status: DC

## 2019-09-29 MED ORDER — ESTRADIOL 0.5 MG PO TABS: 0.5000 mg | ORAL_TABLET | Freq: Every day | ORAL | 1 refills | Status: DC

## 2019-09-29 MED ORDER — ZYRTEC ALLERGY 10 MG PO CAPS: 1.0000 | ORAL_CAPSULE | Freq: Every day | ORAL | 1 refills | Status: DC

## 2019-09-29 MED ORDER — ATORVASTATIN CALCIUM 20 MG PO TABS: 20.0000 mg | ORAL_TABLET | Freq: Every day | ORAL | 1 refills | Status: DC

## 2019-09-29 MED ORDER — TRAZODONE HCL 50 MG PO TABS: 25.0000 mg | ORAL_TABLET | Freq: Every evening | ORAL | 1 refills | Status: DC | PRN

## 2019-09-29 NOTE — Progress Notes (Signed)
Name: Lauren Johnston   MRN: [id number]    DOB: [birth year]   Date:09/29/2019       Progress Note  Subjective  Chief Complaint  Chief Complaint  Patient presents with  . Depression  . Medication Refill    I connected with  Laverna Peace  on 09/29/19 at  9:20 AM EST by a video enabled telemedicine application and verified that I am speaking with the correct person using two identifiers.  I discussed the limitations of evaluation and management by telemedicine and the availability of in person appointments. The patient expressed understanding and agreed to proceed. Staff also discussed with the patient that there may be a patient responsible charge related to this service. Patient Location: at home  Provider Location: [facility]   HPI  Chronic cough:going on for about one year now, seen by Dr. Rosine Abe, using nasal spray, zyrtec and omeprazole, but still has a nocturnal cough and sputum production. On Dr. Jeronimo Greaves note next step was to get CT chest, she has not contacted him back. She does not want to have a CT at this time  Hyperlipidemia: taking Atorvastatin, explained importance of having yearly labs done, last lipid panel 08/2018. LDl was 86, HDL 52  Enlarger left thyroid lobe/ non toxic multinodular goiter : seen on CXR done 05/2018 and unchanged from previous CXR done 09/2008, she is under the care of Dr. Honor Junes   OA both knees: stable, taking tylenol prn now, no effusion or redness   Insomnia/Memory problems: she states she has been taking a half pill every night now and is sleeping better, she still wakes up during the night but falls back asleep ,  she is afraid of affecting her memory. She had a medicare wellness recently and CIT 6 was 2  CKI stage III: discussed avoiding NSAID's and drink more water, taking Tylenol prn , explained that she is due for labs. Last GFR was over one yar ago at 23 . She denies pruritis or decrease in urine output      Major Depression: She was very stressed when her sister moved to [facility] in 30-Nov-2016, she was living independently until Feb 2020, she was  [facility] but since [place] she went [facility] but had a fall and brain bleed and is now at [facility]. She was the caregiver for her husband that died in Dec 01, 2011 . She states she is less stressed since her sister is under better care now.  She states she has been carrying for her sister's grandson and is helping her being active and also her mood. She states her husband is very supportive   Patient Active Problem List   Diagnosis Date Noted  . Class 1 obesity in adult 11/11/2016  . Osteopenia 10/09/2016  . Special screening for malignant neoplasms, colon   . Rectal polyp   . Right wrist tendonitis 05/16/2016  . Osteoarthritis of left hip 05/16/2016  . Vitamin D deficiency 02/05/2016  . Cold sore 02/05/2016  . Degeneration of intervertebral disc of cervical region 02/05/2016  . Insomnia 04/23/2015  . Major depression in remission ([place]) 04/23/2015  . Chronic pain 04/23/2015  . Goiter, nontoxic, 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  . 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 Uterine leiomyoma 06/16/2007    Past Surgical History:  Procedure Laterality Date  . ABDOMINAL HYSTERECTOMY    . BILATERAL OOPHORECTOMY    . COLONOSCOPY WITH PROPOFOL N/A 06/02/2016   Procedure: COLONOSCOPY WITH PROPOFOL;  Surgeon: Lucilla Lame, MD;  Location: [place];  Service: Endoscopy;  Laterality: N/A;  . dequavian tendonitis    . KNEE ARTHROSCOPY    . POLYPECTOMY  06/02/2016   Procedure: POLYPECTOMY;  Surgeon: Lucilla Lame, MD;  Location: [place];  Service: Endoscopy;;    Family History  Problem Relation Age of Onset  . Diabetes Mother   . Heart disease Mother   . Emphysema Father   . Heart disease Father   . COPD Father   . Multiple myeloma Brother   . Alzheimer's disease Sister   . Dementia Sister      Current Outpatient  Medications:  .  aspirin 81 MG tablet, Take 81 mg by mouth daily., Disp: , Rfl:  .  atorvastatin (LIPITOR) 20 MG tablet, Take 1 tablet (20 mg total) by mouth daily., Disp: 90 tablet, Rfl: 1 .  Biotin w/ Vitamins C & E (HAIR SKIN & NAILS GUMMIES PO), Take by mouth. Taking every other day, Disp: , Rfl:  .  Calcium Carb-Cholecalciferol (CALCIUM 1000 + D PO), Take by mouth. Taking every other day, Disp: , Rfl:  .  Cetirizine HCl (ZYRTEC ALLERGY) 10 MG CAPS, Take 1 capsule (10 mg total) by mouth daily. (Patient not taking: Reported on 07/26/2019), Disp: 90 capsule, Rfl: 1 .  DULoxetine (CYMBALTA) 60 MG capsule, Take 1 capsule (60 mg total) by mouth daily., Disp: 30 capsule, Rfl: 0 .  estradiol (ESTRACE) 0.5 MG tablet, TAKE 1 TABLET BY MOUTH EVERY DAY, Disp: 30 tablet, Rfl: 0 .  fluticasone (FLONASE) 50 MCG/ACT nasal spray, Place 2 sprays into both nostrils daily. (Patient taking differently: Place 2 sprays into both nostrils daily. PRN only), Disp: 48 g, Rfl: 1 .  levothyroxine (SYNTHROID, LEVOTHROID) 50 MCG tablet, Take 1 tablet (50 mcg total) by mouth daily before breakfast., Disp: 90 tablet, Rfl: 0 .  omeprazole (PRILOSEC) 20 MG capsule, Take 1 capsule (20 mg total) by mouth daily., Disp: 30 capsule, Rfl: 11 .  traZODone (DESYREL) 50 MG tablet, Take 0.5-1 tablets (25-50 mg total) by mouth at bedtime as needed for sleep., Disp: 30 tablet, Rfl: 0  Allergies  Allergen Reactions  . Sulfamethoxazole-Trimethoprim Other (See Comments) and Rash  . Citalopram Itching  . Tetracycline Other (See Comments)    Other Reaction: URTICARIA  . Tetracyclines & Related Itching  . Codeine Itching    I personally reviewed active problem list, medication list, allergies, family history, social history, health maintenance with the patient/caregiver today.   ROS  Ten systems reviewed and is negative except as mentioned in HPI   Objective  Virtual encounter, vitals not obtained.  There is no height or weight  on file to calculate BMI.  Physical Exam  Awake, alert and oriented   PHQ2/9: Depression screen [facility] 2/9 09/29/2019 07/26/2019 05/06/2019 11/02/2018 08/02/2018  Decreased Interest 0 0 2 0 0  Down, Depressed, Hopeless 0 0 2 0 0  PHQ - 2 Score 0 0 4 0 0  Altered sleeping 0 - '1 1 1  ' Tired, decreased energy 0 - 2 0 1  Change in appetite 0 - 2 1 0  Feeling bad or failure about yourself  0 - 0 0 0  Trouble concentrating 0 - 0 0 0  Moving slowly or fidgety/restless 0 - 0 0 0  Suicidal thoughts 0 - 0 0 0  PHQ-9 Score 0 - '9 2 2  ' Difficult doing work/chores - - Somewhat difficult Not difficult at all -  Some recent data might be hidden  PHQ-2/9 Result is negative.    Fall Risk: Fall Risk  09/29/2019 07/26/2019 05/06/2019 11/02/2018 08/02/2018  Falls in the past year? 0 0 0 1 0  Number falls in past yr: 0 0 0 0 0  Injury with Fall? 0 0 0 1 -  Follow up - Falls prevention discussed - Falls evaluation completed -     Assessment & Plan  1. Osteopenia after menopause  Continue vitamin D supplementation   2. Hypothyroidism, postablative  - TSH  3. Primary insomnia  - traZODone (DESYREL) 50 MG tablet; Take 0.5-1 tablets (25-50 mg total) by mouth at bedtime as needed for sleep.  Dispense: 45 tablet; Refill: 1  4. Multinodular goiter  Keep follow up with Dr. Honor Junes   5. Stage 3a chronic kidney disease  Recheck labs  6. Pure hypercholesterolemia  - Lipid panel - COMPLETE METABOLIC PANEL WITH GFR - atorvastatin (LIPITOR) 20 MG tablet; Take 1 tablet (20 mg total) by mouth daily.  Dispense: 90 tablet; Refill: 1  7. Vitamin D deficiency  - VITAMIN D 25 Hydroxy (Vit-D Deficiency, Fractures)  8. Major depression in remission (H[facility])  - DULoxetine (CYMBALTA) 60 MG capsule; Take 1 capsule (60 mg total) by mouth daily.  Dispense: 90 capsule; Refill: 1  9. Primary osteoarthritis of both knees   10. Menopause  - estradiol (ESTRACE) 0.5 MG tablet; Take 1 tablet (0.5 mg total) by mouth  daily.  Dispense: 90 tablet; Refill: 1  11. Chronic cough  - CBC with Differential/Platelet - Cetirizine HCl (ZYRTEC ALLERGY) 10 MG CAPS; Take 1 capsule (10 mg total) by mouth daily.  Dispense: 90 capsule; Refill: 1  12. Hyperglycemia  - Hemoglobin A1c  I discussed the assessment and treatment plan with the patient. The patient was provided an opportunity to ask questions and all were answered. The patient agreed with the plan and demonstrated an understanding of the instructions.  The patient was advised to call back or seek an in-person evaluation if the symptoms worsen or if the condition fails to improve as anticipated.  I provided 25 minutes of non-face-to-face time during this encounter.

## 2019-10-24 DIAGNOSIS — R739 Hyperglycemia, unspecified: Secondary | ICD-10-CM | POA: Diagnosis not present

## 2019-10-24 DIAGNOSIS — E7800 Pure hypercholesterolemia, unspecified: Secondary | ICD-10-CM | POA: Diagnosis not present

## 2019-10-24 DIAGNOSIS — E890 Postprocedural hypothyroidism: Secondary | ICD-10-CM | POA: Diagnosis not present

## 2019-10-24 DIAGNOSIS — R05 Cough: Secondary | ICD-10-CM | POA: Diagnosis not present

## 2019-10-25 LAB — CBC WITH DIFFERENTIAL/PLATELET
Absolute Monocytes: 410 cells/uL (ref 200–950)
Basophils Absolute: 29 cells/uL (ref 0–200)
Basophils Relative: 0.5 %
Eosinophils Absolute: 405 cells/uL (ref 15–500)
Eosinophils Relative: 7.1 %
HCT: 41.1 % (ref 35.0–45.0)
Hemoglobin: 13.2 g/dL (ref 11.7–15.5)
Lymphs Abs: 2183 cells/uL (ref 850–3900)
MCH: 30.1 pg (ref 27.0–33.0)
MCHC: 32.1 g/dL (ref 32.0–36.0)
MCV: 93.6 fL (ref 80.0–100.0)
MPV: 9.6 fL (ref 7.5–12.5)
Monocytes Relative: 7.2 %
Neutro Abs: 2673 cells/uL (ref 1500–7800)
Neutrophils Relative %: 46.9 %
Platelets: 305 10*3/uL (ref 140–400)
RBC: 4.39 10*6/uL (ref 3.80–5.10)
RDW: 12.7 % (ref 11.0–15.0)
Total Lymphocyte: 38.3 %
WBC: 5.7 10*3/uL (ref 3.8–10.8)

## 2019-10-25 LAB — COMPLETE METABOLIC PANEL WITH GFR
AG Ratio: 1.2 (calc) (ref 1.0–2.5)
ALT: 12 U/L (ref 6–29)
AST: 16 U/L (ref 10–35)
Albumin: 3.7 g/dL (ref 3.6–5.1)
Alkaline phosphatase (APISO): 68 U/L (ref 37–153)
BUN/Creatinine Ratio: 14 (calc) (ref 6–22)
BUN: 16 mg/dL (ref 7–25)
CO2: 31 mmol/L (ref 20–32)
Calcium: 8.7 mg/dL (ref 8.6–10.4)
Chloride: 104 mmol/L (ref 98–110)
Creat: 1.13 mg/dL — ABNORMAL HIGH (ref 0.50–0.99)
GFR, Est African American: 57 mL/min/{1.73_m2} — ABNORMAL LOW (ref >=60)
GFR, Est Non African American: 50 mL/min/{1.73_m2} — ABNORMAL LOW (ref >=60)
Globulin: 3.2 g/dL (calc) (ref 1.9–3.7)
Glucose, Bld: 95 mg/dL (ref 65–99)
Potassium: 4.1 mmol/L (ref 3.5–5.3)
Sodium: 139 mmol/L (ref 135–146)
Total Bilirubin: 0.3 mg/dL (ref 0.2–1.2)
Total Protein: 6.9 g/dL (ref 6.1–8.1)

## 2019-10-25 LAB — LIPID PANEL
Cholesterol: 158 mg/dL (ref <200)
HDL: 45 mg/dL — ABNORMAL LOW (ref >=50)
LDL Cholesterol (Calc): 94 mg/dL (calc)
Non-HDL Cholesterol (Calc): 113 mg/dL (calc) (ref <130)
Total CHOL/HDL Ratio: 3.5 (calc) (ref <5.0)
Triglycerides: 95 mg/dL (ref <150)

## 2019-10-25 LAB — HEMOGLOBIN A1C
Hgb A1c MFr Bld: 5.7 % of total Hgb — ABNORMAL HIGH (ref <5.7)
Mean Plasma Glucose: 117 (calc)
eAG (mmol/L): 6.5 (calc)

## 2019-10-25 LAB — VITAMIN D 25 HYDROXY (VIT D DEFICIENCY, FRACTURES): Vit D, 25-Hydroxy: 25 ng/mL — ABNORMAL LOW (ref 30–100)

## 2019-10-25 LAB — TSH: TSH: 2.98 mIU/L (ref 0.40–4.50)

## 2019-11-14 ENCOUNTER — Ambulatory Visit (INDEPENDENT_AMBULATORY_CARE_PROVIDER_SITE_OTHER): Payer: Medicare Other | Admitting: Family Medicine

## 2019-11-14 ENCOUNTER — Encounter: Payer: Self-pay | Admitting: Family Medicine

## 2019-11-14 VITALS — Temp 96.6°F | Ht 66.0 in | Wt 198.0 lb

## 2019-11-14 DIAGNOSIS — R102 Pelvic and perineal pain: Secondary | ICD-10-CM

## 2019-11-14 DIAGNOSIS — R109 Unspecified abdominal pain: Secondary | ICD-10-CM

## 2019-11-14 NOTE — Progress Notes (Signed)
Name: Lauren Johnston   MRN: [id number]    DOB: 06/28/1951   Date:11/14/2019       Progress Note  Subjective:    Chief Complaint  Chief Complaint  Patient presents with  . Pelvic Pain    1.5 weeks, has had hysterectomy and ovaries removed.  She states it feels like pressure.  Denies any hematuria, frequency or buring.  . Flank Pain    right months    I connected with  Lauren Johnston  on 11/14/19 at 11:40 AM EDT by a video enabled telemedicine application and verified that I am speaking with the correct person using two identifiers.  I discussed the limitations of evaluation and management by telemedicine and the availability of in person appointments. The patient expressed understanding and agreed to proceed. Staff also discussed with the patient that there may be a patient responsible charge related to this service. Patient Location: home Provider Location[facility] Additional Individuals present: none  HPI Pelvic pain - she has been having lower abd/pelvic pain and pressure started last week and she's also been having side pain.  It did feel a little better with tylenol  She felt like something was dropping, pressure, much worse if she moved around, for example she said going from sitting to standing just increased the pressure lower in her pelvis. She denies associated bowel changes, loose stool, gas, bloating, urinary frequency, dysuria, hematuria.  She states she has a history of constipation and irritable bowel and she did not have any change or exacerbation from baseline. She also reports right side pain she reports that is been there for months located all to her right side she points to below her breast and with her ribs all the way down to her hip bone and states that it is been there for months and is fairly constant is much more uncomfortable when she lays down at night she cannot lay on that side and fall asleep.  She denies any past history of kidney stones, gallstones.  She  denies any worsening of pain with twisting her back or with deep inspiration. She also developed URI sx last week and wanted to come in Friday but felt so ill with runny nose and coughing that she chose to wait over the weekend.  She denies any fever chills sweats malaise, change in her appetite, balance disturbances, fatigue    Patient Active Problem List   Diagnosis Date Noted  . Class 1 obesity in adult 11/11/2016  . Osteopenia 10/09/2016  . Special screening for malignant neoplasms, colon   . Rectal polyp   . Right wrist tendonitis 05/16/2016  . Osteoarthritis of left hip 05/16/2016  . Vitamin D deficiency 02/05/2016  . Cold sore 02/05/2016  . Degeneration of intervertebral disc of cervical region 02/05/2016  . Insomnia 04/23/2015  . Major depression in remission ([place]) 04/23/2015  . Chronic pain 04/23/2015  . Goiter, nontoxic, 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  . 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 Uterine leiomyoma 06/16/2007    Social History   Tobacco Use  . Smoking status: Never Smoker  . Smokeless tobacco: Never Used  Substance Use Topics  . Alcohol use: No    Alcohol/week: 0.0 standard drinks     Current Outpatient Medications:  .  aspirin 81 MG tablet, Take 81 mg by mouth daily., Disp: , Rfl:  .  atorvastatin (LIPITOR) 20 MG tablet, Take 1 tablet (20 mg total) by mouth daily., Disp: 90 tablet, Rfl: 1 .  Biotin w/  Vitamins C & E (HAIR SKIN & NAILS GUMMIES PO), Take by mouth. Taking every other day, Disp: , Rfl:  .  Calcium Carb-Cholecalciferol (CALCIUM 1000 + D PO), Take by mouth. Taking every other day, Disp: , Rfl:  .  DULoxetine (CYMBALTA) 60 MG capsule, Take 1 capsule (60 mg total) by mouth daily., Disp: 90 capsule, Rfl: 1 .  estradiol (ESTRACE) 0.5 MG tablet, Take 1 tablet (0.5 mg total) by mouth daily., Disp: 90 tablet, Rfl: 1 .  fluticasone (FLONASE) 50 MCG/ACT nasal spray, Place 2 sprays into both nostrils daily. (Patient taking differently: Place 2 sprays  into both nostrils daily. PRN only), Disp: 48 g, Rfl: 1 .  levothyroxine (SYNTHROID, LEVOTHROID) 50 MCG tablet, Take 1 tablet (50 mcg total) by mouth daily before breakfast., Disp: 90 tablet, Rfl: 0 .  omeprazole (PRILOSEC) 20 MG capsule, Take 1 capsule (20 mg total) by mouth daily., Disp: 30 capsule, Rfl: 11 .  traZODone (DESYREL) 50 MG tablet, Take 0.5-1 tablets (25-50 mg total) by mouth at bedtime as needed for sleep., Disp: 45 tablet, Rfl: 1 .  Cetirizine HCl (ZYRTEC ALLERGY) 10 MG CAPS, Take 1 capsule (10 mg total) by mouth daily. (Patient not taking: Reported on 11/14/2019), Disp: 90 capsule, Rfl: 1  Allergies  Allergen Reactions  . Sulfamethoxazole-Trimethoprim Other (See Comments) and Rash  . Citalopram Itching  . Tetracycline Other (See Comments)    Other Reaction: URTICARIA  . Tetracyclines & Related Itching  . Codeine Itching    I personally reviewed active problem list, medication list, allergies, family history, social history, health maintenance, notes from last encounter, lab results, imaging with the patient/caregiver today.   Review of Systems  10 Systems reviewed and are negative for acute change except as noted in the HPI.   Objective:   Virtual encounter, vitals limited, only able to obtain the following Today's Vitals   11/14/19 1145  Temp: (!) 96.6 F (35.9 C)  Weight: 198 lb (89.8 kg)  Height: 5\' 6"  (1.676 m)  PainSc: 7    Body mass index is 31.96 kg/m. Nursing Note and Vital Signs reviewed.  Physical Exam Well-appearing elderly female, articulate, able to walk around her room also lay down and get back up on her bed she did wince a little bit when laying down on her right side.  No acute distress, no coughing, tachypnea, audible wheeze or stridor  PE limited by telephone encounter  No results found for this or any previous visit (from the past 72 hour(s)).  Assessment and Plan:     ICD-10-CM   1. Pelvic pain in female  R10.2 POCT urinalysis  dipstick    Urine Culture   r/o UTI, no GI sx, no past diverticulitis, s/p TAH-BSO  2. Right flank pain  R10.9      Patient with virtual encounter today for low abdominal and pelvic pain that developed in the last week she also complains of several months of right side pain flank area down to her hips.  Does not have any symptoms associated that suggest a clear etiology, no bowel changes, no history of diverticulosis or diverticulitis, she did describe as a pressure to her pelvic area but she had no dysuria hematuria urinary frequency urgency.  Her side pain and flank pain have been ongoing for months has been unchanged is worse when she lays down on that side she denies any nausea, past history of kidney stones or gallstones thought it sounded a little bit more muscle skeletal but  she on the video was able to twist and move around very easily, she denied any exacerbation of her pain with deep inspiration so unclear what might be causing her right side pain and right flank pain  Did encourage her to come by the office to get leave a urine sample so he can evaluate for UTI as a possible etiology, also in the differential would be nephrolithiasis, pyelonephritis, cholelithiasis, GI etiology?  She will try and come by tomorrow first thing in the morning tomorrow if I have time to also examine her exam may be helpful to focus the differential  Consider getting chest x-ray two-view and KUB, discussed with the patient utility of blood work and lab work which would not show very much could use CMP to look at her digestive enzymes liver function test and a CBC to check her white count but explained is very nonspecific most of the time is unremarkable.     -Red flags and when to present for emergency care or RTC including fever >101.74F, chest pain, shortness of breath, new/worsening/un-resolving symptoms, reviewed with patient at time of visit. Follow up and care instructions discussed and provided in AVS. -  I discussed the assessment and treatment plan with the patient. The patient was provided an opportunity to ask questions and all were answered. The patient agreed with the plan and demonstrated an understanding of the instructions.  I provided 30+ minutes of non-face-to-face time during this encounter, 20 min on with pt, remainder of time to review chart, document, coordinate care with nurses with testing for tomorrow morning.  Delsa Grana, PA-C 11/14/19 11:55 AM

## 2019-11-15 DIAGNOSIS — R102 Pelvic and perineal pain: Secondary | ICD-10-CM | POA: Diagnosis not present

## 2019-11-15 LAB — POCT URINALYSIS DIPSTICK
Bilirubin, UA: NEGATIVE
Blood, UA: NEGATIVE
Glucose, UA: NEGATIVE
Ketones, UA: NEGATIVE
Leukocytes, UA: NEGATIVE
Nitrite, UA: NEGATIVE
Odor: NORMAL
Protein, UA: NEGATIVE
Spec Grav, UA: 1.01 (ref 1.010–1.025)
Urobilinogen, UA: 0.2 E.U./dL
pH, UA: 7 (ref 5.0–8.0)

## 2019-11-15 NOTE — Addendum Note (Signed)
Addended by: Delsa Grana on: 11/15/2019 08:30 AM   Modules accepted: Orders

## 2019-11-16 LAB — URINE CULTURE
MICRO NUMBER:: 10258849
Result:: NO GROWTH
SPECIMEN QUALITY:: ADEQUATE

## 2020-02-01 ENCOUNTER — Other Ambulatory Visit: Payer: Self-pay | Admitting: Family Medicine

## 2020-02-01 DIAGNOSIS — F5101 Primary insomnia: Secondary | ICD-10-CM

## 2020-02-01 DIAGNOSIS — Z780 Asymptomatic menopausal state: Secondary | ICD-10-CM

## 2020-02-08 ENCOUNTER — Telehealth: Payer: Self-pay | Admitting: Family Medicine

## 2020-02-08 NOTE — Telephone Encounter (Signed)
Pt is requesting a referral for cough (in which she has had for a while). She has discussed this with her pcp before. States when she cough she has phlegm

## 2020-02-08 NOTE — Telephone Encounter (Signed)
FYI

## 2020-02-09 ENCOUNTER — Other Ambulatory Visit: Payer: Self-pay | Admitting: Family Medicine

## 2020-02-09 NOTE — Telephone Encounter (Signed)
Would like referral to see someone else. Do not want a virtual appointment with Dr. Justice Deeds. Please put in order.

## 2020-02-10 ENCOUNTER — Other Ambulatory Visit: Payer: Self-pay | Admitting: Family Medicine

## 2020-02-10 DIAGNOSIS — R053 Chronic cough: Secondary | ICD-10-CM

## 2020-02-16 ENCOUNTER — Other Ambulatory Visit: Payer: Self-pay

## 2020-02-16 ENCOUNTER — Ambulatory Visit (INDEPENDENT_AMBULATORY_CARE_PROVIDER_SITE_OTHER): Payer: Medicare Other | Admitting: Pulmonary Disease

## 2020-02-16 ENCOUNTER — Encounter: Payer: Self-pay | Admitting: Pulmonary Disease

## 2020-02-16 VITALS — BP 124/70 | HR 70 | Temp 98.0°F | Ht 66.0 in | Wt 204.2 lb

## 2020-02-16 DIAGNOSIS — J411 Mucopurulent chronic bronchitis: Secondary | ICD-10-CM | POA: Diagnosis not present

## 2020-02-16 DIAGNOSIS — E010 Iodine-deficiency related diffuse (endemic) goiter: Secondary | ICD-10-CM

## 2020-02-16 DIAGNOSIS — J9809 Other diseases of bronchus, not elsewhere classified: Secondary | ICD-10-CM

## 2020-02-16 DIAGNOSIS — R05 Cough: Secondary | ICD-10-CM | POA: Diagnosis not present

## 2020-02-16 DIAGNOSIS — J398 Other specified diseases of upper respiratory tract: Secondary | ICD-10-CM

## 2020-02-16 DIAGNOSIS — R059 Cough, unspecified: Secondary | ICD-10-CM

## 2020-02-16 MED ORDER — BREO ELLIPTA 100-25 MCG/INH IN AEPB: 1.0000 | INHALATION_SPRAY | Freq: Every day | RESPIRATORY_TRACT | 0 refills | Status: AC

## 2020-02-16 MED ORDER — BREO ELLIPTA 100-25 MCG/INH IN AEPB: 1.0000 | INHALATION_SPRAY | Freq: Every day | RESPIRATORY_TRACT | 11 refills | Status: DC

## 2020-02-16 NOTE — Progress Notes (Signed)
Subjective:    Patient ID: Lauren Johnston, female    DOB: Aug 27, 1951, 69 y.o.   MRN: [id number]  HPI Patient is a 69 year old lifelong never smoker who presents for reevaluation of a chronic cough.  Cough occurs mostly at nighttime.  She was previously evaluated by Dr. Marda Stalker on 22 September 2018.  Her primary physician is Dr. Steele Sizer.  At the time of initial evaluation by Dr. Ashby Dawes it was noted that she likely had chronic bronchitis with excess expectoration which waked her up at night.  The patient states that this has continued to be an issue.  She was treated with nasal steroid, PPI and antihistamine but none of these interventions helped significantly.  When they helped it was only transient.  At the time of that evaluation also it was noted that she had "slight deviation of the upper trachea" which appeared to be chronic and a CT scan of the chest was entertained but not pursued.  The patient presents with the exact same symptoms again mostly her issues are at nighttime.  The patient has a history of hypothyroidism is on Synthroid.  Is followed by endocrinology for this issue.  She states that she was diagnosed with "asthma" approximately 2 years ago.  She has had no fevers, chills or sweats.  I have reviewed Dr. Mathis Fare notes from her visit in January 2020.  She is not on ACE inhibitors.  Data obtained previously: **Spirometry 06/02/2018, >> FVC is 2.43 L, 87% predicted, FEV1 is 2.04 L, 93% predicted.  Ratio is 83%.  Flow volume loops are unremarkable.  Overall this test shows normal pulmonary functions. **Chest x-ray 05/04/2018>> image personally reviewed, representative view as below, slight rightward deviation of the upper trachea, otherwise unremarkable. **CBC 05/12/2017>> absolute eosinophil count 1131.  Most recent CBCs without eosinophilia.  Review of Systems A 10 point review of systems was performed and it is as noted above otherwise negative.  Past  Medical History:  Diagnosis Date  . Arthritis    hips  . Dental crowns present    dental implants - upper  . Depression   . GERD (gastroesophageal reflux disease)   . Hyperlipidemia   . Hypothyroidism   . Thyroid disease    Past Surgical History:  Procedure Laterality Date  . ABDOMINAL HYSTERECTOMY    . BILATERAL OOPHORECTOMY    . COLONOSCOPY WITH PROPOFOL N/A 06/02/2016   Procedure: COLONOSCOPY WITH PROPOFOL;  Surgeon: Lucilla Lame, MD;  Location: [facility];  Service: Endoscopy;  Laterality: N/A;  . dequavian tendonitis    . KNEE ARTHROSCOPY    . POLYPECTOMY  06/02/2016   Procedure: POLYPECTOMY;  Surgeon: Lucilla Lame, MD;  Location: [facility];  Service: Endoscopy;;   Family History  Problem Relation Age of Onset  . Diabetes Mother   . Heart disease Mother   . Emphysema Father   . Heart disease Father   . COPD Father   . Multiple myeloma Brother   . Alzheimer's disease Sister   . Dementia Sister    Social History   Tobacco Use  . Smoking status: Never Smoker  . Smokeless tobacco: Never Used  Substance Use Topics  . Alcohol use: No    Alcohol/week: 0.0 standard drinks   Allergies  Allergen Reactions  . Sulfamethoxazole-Trimethoprim Other (See Comments) and Rash  . Citalopram Itching  . Tetracycline Other (See Comments)    Other Reaction: URTICARIA  . Tetracyclines & Related Itching  . Codeine Itching  Current Meds  Medication Sig  . aspirin 81 MG tablet Take 81 mg by mouth daily.  Marland Kitchen atorvastatin (LIPITOR) 20 MG tablet Take 1 tablet (20 mg total) by mouth daily.  . Biotin w/ Vitamins C & E (HAIR SKIN & NAILS GUMMIES PO) Take by mouth. Taking every other day  . Calcium Carb-Cholecalciferol (CALCIUM 1000 + D PO) Take by mouth. Taking every other day  . DULoxetine (CYMBALTA) 60 MG capsule Take 1 capsule (60 mg total) by mouth daily.  . fluticasone (FLONASE) 50 MCG/ACT nasal spray Place 2 sprays into both nostrils daily. (Patient taking  differently: Place 2 sprays into both nostrils daily. PRN only)  . levothyroxine (SYNTHROID, LEVOTHROID) 50 MCG tablet Take 1 tablet (50 mcg total) by mouth daily before breakfast.  . omeprazole (PRILOSEC) 20 MG capsule Take 1 capsule (20 mg total) by mouth daily.  . [DISCONTINUED] estradiol (ESTRACE) 0.5 MG tablet TAKE 1 TABLET BY MOUTH EVERY DAY  . [DISCONTINUED] traZODone (DESYREL) 50 MG tablet TAKE 0.5-1 TABLETS (25-50 MG TOTAL) BY MOUTH AT BEDTIME AS NEEDED FOR SLEEP.   Immunization History  Administered Date(s) Administered  . Fluad Quad(high Dose 65+) 05/06/2019  . Influenza Split 06/16/2007, 06/19/2008, 09/07/2008, 07/05/2009, 04/25/2010  . Influenza, High Dose Seasonal PF 05/14/2017, 06/02/2018  . Influenza, Seasonal, Injecte, Preservative Fre 06/25/2011, 05/26/2012, 05/18/2013  . Influenza,inj,Quad PF,6+ Mos 06/07/2014, 04/23/2015, 05/07/2016  . Influenza-Unspecified 06/07/2014  . Moderna SARS-COVID-2 Vaccination 10/15/2019, 11/12/2019  . Pneumococcal Conjugate-13 07/17/2017  . Pneumococcal Polysaccharide-23 02/05/2016  . Tdap 08/01/2010  . Zoster 04/07/2011       Objective:   Physical Exam BP 124/70 (BP Location: Left Arm, Cuff Size: Normal)   Pulse 70   Temp 98 F (36.7 C) (Temporal)   Ht '5\' 6"'$  (1.676 m)   Wt 204 lb 3.2 oz (92.6 kg)   SpO2 97%   BMI 32.96 kg/m   GENERAL: Obese woman, no acute distress, fully ambulatory. HEAD: Normocephalic, atraumatic.  EYES: Pupils equal, round, reactive to light.  No scleral icterus.  MOUTH: Nose/mouth/throat not examined due to masking requirements for COVID 19. NECK: Supple. No thyromegaly. Trachea midline. No JVD.  No adenopathy. PULMONARY: Good air entry bilaterally.  Lungs clear to auscultation bilaterally. CARDIOVASCULAR: S1 and S2. Regular rate and rhythm.  No rubs, murmurs or gallops. GASTROINTESTINAL: Benign. MUSCULOSKELETAL: No joint deformity, no clubbing, no edema.  NEUROLOGIC: No focal deficits, no gait  disturbance.  Speech is fluent. SKIN: Intact,warm,dry.  Limited exam, no rashes PSYCH: Mood and behavior appropriate.  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   Assessment & Plan:     ICD-10-CM   1. Cough  R05 Pulmonary function test    CT CHEST W CONTRAST   Chronic Suspect mechanical issue  2. Bronchorrhea  J98.09 Pulmonary function test    CT CHEST W CONTRAST   Chronic issue for the patient over the last 2 years History of chronic bronchitis Cannot rule out bronchiectasis CT  Query bronchiectasis Query mechanical  3. Mucopurulent chronic bronchitis ([place])  J41.1    Per previous diagnosis Check PFTs  4. 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 CT CHEST W CONTRAST    Standing Status:   Future    Number of Occurrences:   1  Standing Expiration Date:   06/17/2020    Order Specific Question:   ** REASON FOR EXAM (FREE TEXT)    Answer:   rule out bronchiectasis    Order Specific Question:   If indicated for the ordered procedure, I authorize the administration of contrast media per Radiology protocol    Answer:   Yes    Order Specific Question:   Preferred imaging location?    Answer:   [facility]    Order Specific Question:   Radiology Contrast Protocol - do NOT remove file path    Answer:   [email or web address].pdf  . Pulmonary function test    Standing Status:   Future    Number of Occurrences:   1    Standing Expiration Date:   02/15/2021    Scheduling Instructions:     NEXT AVAILABLE.    Order Specific Question:   Where should this test be performed?    Answer:   [facility]    Order Specific Question:   Full PFT: includes the following: basic spirometry, spirometry pre & post  bronchodilator, diffusion capacity (DLCO), lung volumes    Answer:   Full PFT   Meds ordered this encounter  Medications  . fluticasone furoate-vilanterol (BREO ELLIPTA) 100-25 MCG/INH AEPB    Sig: Inhale 1 puff into the lungs daily for 1 day.    Dispense:  14 each    Refill:  0    Order Specific Question:   Lot Number?    Answer:   [id number]    Order Specific Question:   Expiration Date?    Answer:   07/02/2020    Order Specific Question:   Manufacturer?    Answer:   [employer] [[id number]]    Order Specific Question:   Quantity    Answer:   1  . fluticasone furoate-vilanterol (BREO ELLIPTA) 100-25 MCG/INH AEPB    Sig: Inhale 1 puff into the lungs daily.    Dispense:  28 each    Refill:  11   Discussion:  Patient has had issues with chronic cough.  Has a history of asthma and eosinophilia noted previously 2 years ago.  She is on no inhalers at present.  She has developed issues with bronchorrhea versus just chronic irritation.  I suspect most of her issues are related to mechanical problems with what I suspect is an intrathoracic thyroid.  This will be more obvious when she is recumbent just because of the pressure from the thyroid on the trachea.  We will obtain PFTs to further assess her issues with somatic bronchitis/mucopurulent bronchitis, flow volume loop will be helpful also in assessing impact of intrathoracic thyroid in the upper airway.  Additionally she may have developed bronchiectasis we will going ahead and get CT of the chest this is will allow [place] to assess for potential intrathoracic thyroid and potential bronchiectasis.  We will give her a trial of Breo Ellipta 100/25, 1 inhalation daily to see if this ameliorates her symptoms.  Follow-up in 4 to 6 weeks time she is to contact us prior to that time should any new difficulties arise.  Renold Don, MD [facility]   *This note was dictated using voice recognition software/Dragon.  Despite best efforts to proofread, errors  can occur which can change the meaning.  Any change was purely unintentional.

## 2020-02-16 NOTE — Patient Instructions (Addendum)
We are going to give you a trial of Breo Ellipta 1 inhalation daily.  I am going to get some breathing tests on you as well a CT of the chest to evaluate the issue with your thyroid goiter and to determine if you have a condition called bronchiectasis.   See you in follow-up in 4 to 6 weeks time call sooner should any new difficulties arise.

## 2020-02-22 DIAGNOSIS — E042 Nontoxic multinodular goiter: Secondary | ICD-10-CM | POA: Diagnosis not present

## 2020-02-22 DIAGNOSIS — E890 Postprocedural hypothyroidism: Secondary | ICD-10-CM | POA: Diagnosis not present

## 2020-02-23 ENCOUNTER — Encounter (HOSPITAL_COMMUNITY): Payer: Medicare Other

## 2020-02-26 ENCOUNTER — Other Ambulatory Visit: Payer: Self-pay | Admitting: Family Medicine

## 2020-02-26 DIAGNOSIS — F5101 Primary insomnia: Secondary | ICD-10-CM

## 2020-02-26 DIAGNOSIS — Z780 Asymptomatic menopausal state: Secondary | ICD-10-CM

## 2020-02-26 NOTE — Telephone Encounter (Signed)
Requested Prescriptions  Pending Prescriptions Disp Refills   traZODone (DESYREL) 50 MG tablet [Pharmacy Med Name: TRAZODONE 50 MG TABLET] 30 tablet 0    Sig: TAKE 0.5-1 TABLETS (25-50 MG TOTAL) BY MOUTH AT BEDTIME AS NEEDED FOR SLEEP.     Psychiatry: Antidepressants - Serotonin Modulator Passed - 02/26/2020 12:57 AM      Passed - Completed PHQ-2 or PHQ-9 in the last 360 days.      Passed - Valid encounter within last 6 months    Recent Outpatient Visits          3 months ago Pelvic pain in female   [facility] [employer], [place], PA-C   5 months ago Osteopenia after menopause   [facility] [place], Drue Stager, MD   9 months ago Moderate episode of recurrent major depressive disorder [facility])   [facility] Steele Sizer, MD   1 year ago Major depression in remission [facility])   [facility] Steele Sizer, MD   1 year ago Well woman exam   [facility] Steele Sizer, MD      Future Appointments            In 3 weeks Tyler Pita, MD [facility] [place]   In 1 month Steele Sizer, MD [facility], [place]   In 5 months  [facility], [place]            estradiol (ESTRACE) 0.5 MG tablet [Pharmacy Med Name: ESTRADIOL 0.5 MG TABLET] 30 tablet 0    Sig: TAKE 1 TABLET BY MOUTH EVERY DAY     OB/GYN:  Estrogens Passed - 02/26/2020 12:57 AM      Passed - Mammogram is up-to-date per Health Maintenance      Passed - Last BP in normal range    BP Readings from Last 1 Encounters:  02/16/20 124/70         Passed - Valid encounter within last 12 months    Recent Outpatient Visits          3 months ago Pelvic pain in female   [facility] Delsa Grana, PA-C   5 months ago Osteopenia after menopause   [facility] [place], Drue Stager, MD   9 months ago Moderate episode of recurrent major depressive disorder [facility])    [facility] Steele Sizer, MD   1 year ago Major depression in remission [facility])   [facility] Steele Sizer, MD   1 year ago Well woman exam   [facility] Steele Sizer, MD      Future Appointments            In 3 weeks Tyler Pita, MD [facility] [place]   In 1 month Steele Sizer, MD [facility], [place]   In 5 months  [facility], [facility]

## 2020-03-01 ENCOUNTER — Ambulatory Visit
Admission: RE | Admit: 2020-03-01 | Discharge: 2020-03-01 | Disposition: A | Discharge status: Home / Self Care | Payer: Medicare Other | Source: Ambulatory Visit | Attending: Pulmonary Disease | Admitting: Pulmonary Disease

## 2020-03-01 ENCOUNTER — Other Ambulatory Visit: Payer: Self-pay

## 2020-03-01 DIAGNOSIS — J9809 Other diseases of bronchus, not elsewhere classified: Secondary | ICD-10-CM | POA: Diagnosis not present

## 2020-03-01 DIAGNOSIS — R059 Cough, unspecified: Secondary | ICD-10-CM

## 2020-03-01 DIAGNOSIS — R918 Other nonspecific abnormal finding of lung field: Secondary | ICD-10-CM | POA: Diagnosis not present

## 2020-03-01 DIAGNOSIS — R911 Solitary pulmonary nodule: Secondary | ICD-10-CM | POA: Diagnosis not present

## 2020-03-01 DIAGNOSIS — J439 Emphysema, unspecified: Secondary | ICD-10-CM | POA: Diagnosis not present

## 2020-03-01 DIAGNOSIS — R05 Cough: Secondary | ICD-10-CM | POA: Diagnosis not present

## 2020-03-01 DIAGNOSIS — I700 Atherosclerosis of aorta: Secondary | ICD-10-CM | POA: Diagnosis not present

## 2020-03-01 LAB — POCT I-STAT CREATININE: Creatinine, Ser: 1.3 mg/dL — ABNORMAL HIGH (ref 0.44–1.00)

## 2020-03-01 MED ORDER — IOHEXOL 300 MG/ML  SOLN
75.0000 mL | Freq: Once | INTRAMUSCULAR | Status: AC | PRN
  Administered 2020-03-01: 75 mL via INTRAVENOUS

## 2020-03-09 ENCOUNTER — Telehealth: Payer: Self-pay

## 2020-03-09 NOTE — Telephone Encounter (Signed)
Pt is aware of date/time of covid test prior to PFT. Pt voiced her understanding and had no further questions.  Nothing further is needed.  

## 2020-03-12 ENCOUNTER — Ambulatory Visit: Payer: Medicare Other | Admitting: Family Medicine

## 2020-03-13 ENCOUNTER — Other Ambulatory Visit: Payer: Self-pay

## 2020-03-13 ENCOUNTER — Other Ambulatory Visit
Admission: RE | Admit: 2020-03-13 | Discharge: 2020-03-13 | Disposition: A | Discharge status: Home / Self Care | Payer: Medicare Other | Source: Ambulatory Visit | Attending: Pulmonary Disease | Admitting: Pulmonary Disease

## 2020-03-13 DIAGNOSIS — Z20822 Contact with and (suspected) exposure to covid-19: Secondary | ICD-10-CM | POA: Diagnosis not present

## 2020-03-13 LAB — SARS CORONAVIRUS 2 (TAT 6-24 HRS): SARS Coronavirus 2: NEGATIVE

## 2020-03-15 ENCOUNTER — Other Ambulatory Visit: Payer: Self-pay

## 2020-03-15 ENCOUNTER — Ambulatory Visit (HOSPITAL_COMMUNITY)
Admission: RE | Admit: 2020-03-15 | Discharge: 2020-03-15 | Disposition: A | Discharge status: Home / Self Care | Payer: Medicare Other | Source: Ambulatory Visit | Attending: Pulmonary Disease | Admitting: Pulmonary Disease

## 2020-03-15 DIAGNOSIS — R05 Cough: Secondary | ICD-10-CM | POA: Diagnosis not present

## 2020-03-15 DIAGNOSIS — J9809 Other diseases of bronchus, not elsewhere classified: Secondary | ICD-10-CM | POA: Diagnosis not present

## 2020-03-15 DIAGNOSIS — R059 Cough, unspecified: Secondary | ICD-10-CM

## 2020-03-15 LAB — PULMONARY FUNCTION TEST
DL/VA % pred: 114 %
DL/VA: 4.66 ml/min/mmHg/L
DLCO unc % pred: 70 %
DLCO unc: 14.69 ml/min/mmHg
FEF 25-75 Post: 2.75 L/sec
FEF 25-75 Pre: 2.24 L/sec
FEF2575-%Change-Post: 22 %
FEF2575-%Pred-Post: 146 %
FEF2575-%Pred-Pre: 119 %
FEV1-%Change-Post: 3 %
FEV1-%Pred-Post: 94 %
FEV1-%Pred-Pre: 91 %
FEV1-Post: 1.94 L
FEV1-Pre: 1.87 L
FEV1FVC-%Change-Post: 4 %
FEV1FVC-%Pred-Pre: 106 %
FEV6-%Change-Post: 0 %
FEV6-%Pred-Post: 88 %
FEV6-%Pred-Pre: 88 %
FEV6-Post: 2.25 L
FEV6-Pre: 2.23 L
FEV6FVC-%Change-Post: 1 %
FEV6FVC-%Pred-Post: 103 %
FEV6FVC-%Pred-Pre: 102 %
FVC-%Change-Post: 0 %
FVC-%Pred-Post: 85 %
FVC-%Pred-Pre: 85 %
FVC-Post: 2.25 L
FVC-Pre: 2.26 L
Post FEV1/FVC ratio: 86 %
Post FEV6/FVC ratio: 100 %
Pre FEV1/FVC ratio: 83 %
Pre FEV6/FVC Ratio: 99 %
RV % pred: 64 %
RV: 1.46 L
TLC % pred: 69 %
TLC: 3.71 L

## 2020-03-15 MED ORDER — ALBUTEROL SULFATE (2.5 MG/3ML) 0.083% IN NEBU
2.5000 mg | INHALATION_SOLUTION | Freq: Once | RESPIRATORY_TRACT | Status: AC
  Administered 2020-03-15: 2.5 mg via RESPIRATORY_TRACT

## 2020-03-22 ENCOUNTER — Ambulatory Visit (INDEPENDENT_AMBULATORY_CARE_PROVIDER_SITE_OTHER): Payer: Medicare Other | Admitting: Pulmonary Disease

## 2020-03-22 ENCOUNTER — Other Ambulatory Visit: Payer: Self-pay

## 2020-03-22 ENCOUNTER — Encounter: Payer: Self-pay | Admitting: Pulmonary Disease

## 2020-03-22 VITALS — BP 118/68 | HR 69 | Temp 97.6°F | Ht 66.0 in | Wt 204.0 lb

## 2020-03-22 DIAGNOSIS — R05 Cough: Secondary | ICD-10-CM

## 2020-03-22 DIAGNOSIS — R0602 Shortness of breath: Secondary | ICD-10-CM | POA: Diagnosis not present

## 2020-03-22 DIAGNOSIS — R053 Chronic cough: Secondary | ICD-10-CM

## 2020-03-22 DIAGNOSIS — E048 Other specified nontoxic goiter: Secondary | ICD-10-CM

## 2020-03-22 MED ORDER — SPIRIVA RESPIMAT 1.25 MCG/ACT IN AERS
2.0000 | INHALATION_SPRAY | Freq: Every day | RESPIRATORY_TRACT | 0 refills | Status: DC
Start: 2020-03-22 — End: 2020-04-16

## 2020-03-22 NOTE — Progress Notes (Signed)
Subjective:    Patient ID: Lauren Johnston, female    DOB: 12-08-1950, 69 y.o.   MRN: [id number]  HPI Patient is a very pleasant 69 year old lifelong never smoker who presents for follow-up on chronic cough.  She was previously evaluated by Dr. Ashby Dawes on 22 September 2018 with the working diagnosis of chronic mucopurulent bronchitis.  She most recently was evaluated on 16 February 2020 when it was noted that she had significant tracheal deviation on chest x-ray.  This raises the concern for intrathoracic (mediastinal) goiter.  That time we ordered PFTs and a CT of the chest.  I have discussed the findings with the patient which are as noted below.  The patient also had a trial of Breo Ellipta 100/25, 1 inhalation daily however this has not been helpful and if anything exacerbated her symptoms.  Her main issues are coughing and increased mucus production when she lays recumbent.  She has not had any fevers, chills or sweats, no hemoptysis.  No chest pain, paroxysmal nocturnal dyspnea or lower extremity edema.  No orthopnea per se but however her cough does exacerbate when she is recumbent.  PFTs did not show evidence of obstruction.  There is restrictive physiology however this may be related to obesity (ERV 51%).  In addition the flow volume loop showed inspiratory plateau Tory abnormality.  Cannot response to bronchodilator.  Diffusion capacity was minimally reduced and corrected to normal by alveolar volume.  CT chest showed no interstitial disease or bronchiectasis.  But did show a substernal extension of a thyroid mass emanating from the left lobe.  This mediastinal extension measures 6.7 x 4.1 x 4.9 cm.  It is believed to represent a goiter.  The right lobe of the thyroid appeared normal.  I discussed all of the above with the patient.  Review of Systems A 10 point review of systems was performed and it is as noted above otherwise negative.  Allergies  Allergen Reactions  .  Sulfamethoxazole-Trimethoprim Other (See Comments) and Rash  . Citalopram Itching  . Tetracycline Other (See Comments)    Other Reaction: URTICARIA  . Tetracyclines & Related Itching  . Codeine Itching   Current Meds  Medication Sig  . aspirin 81 MG tablet Take 81 mg by mouth daily.  Marland Kitchen atorvastatin (LIPITOR) 20 MG tablet Take 1 tablet (20 mg total) by mouth daily.  . Biotin w/ Vitamins C & E (HAIR SKIN & NAILS GUMMIES PO) Take by mouth. Taking every other day  . Calcium Carb-Cholecalciferol (CALCIUM 1000 + D PO) Take by mouth. Taking every other day  . DULoxetine (CYMBALTA) 60 MG capsule Take 1 capsule (60 mg total) by mouth daily.  Marland Kitchen estradiol (ESTRACE) 0.5 MG tablet TAKE 1 TABLET BY MOUTH EVERY DAY  . fluticasone (FLONASE) 50 MCG/ACT nasal spray Place 2 sprays into both nostrils daily. (Patient taking differently: Place 2 sprays into both nostrils daily. PRN only)  . levothyroxine (SYNTHROID, LEVOTHROID) 50 MCG tablet Take 1 tablet (50 mcg total) by mouth daily before breakfast.  . omeprazole (PRILOSEC) 20 MG capsule Take 1 capsule (20 mg total) by mouth daily.  . traZODone (DESYREL) 50 MG tablet TAKE 0.5-1 TABLETS (25-50 MG TOTAL) BY MOUTH AT BEDTIME AS NEEDED FOR SLEEP.   Immunization History  Administered Date(s) Administered  . Fluad Quad(high Dose 65+) 05/06/2019  . Influenza Split 06/16/2007, 06/19/2008, 09/07/2008, 07/05/2009, 04/25/2010  . Influenza, High Dose Seasonal PF 05/14/2017, 06/02/2018  . Influenza, Seasonal, Injecte, Preservative Fre 06/25/2011, 05/26/2012, 05/18/2013  .  Influenza,inj,Quad PF,6+ Mos 06/07/2014, 04/23/2015, 05/07/2016  . Influenza-Unspecified 06/07/2014  . Moderna SARS-COVID-2 Vaccination 10/15/2019, 11/12/2019  . Pneumococcal Conjugate-13 07/17/2017  . Pneumococcal Polysaccharide-23 02/05/2016  . Tdap 08/01/2010  . Zoster 04/07/2011       Objective:   Physical Exam BP 118/68 (BP Location: Left Arm, Cuff Size: Normal)   Pulse 69   Temp 97.6  F (36.4 C) (Oral)   Ht 5\' 6"  (1.676 m)   Wt 204 lb (92.5 kg)   SpO2 99% Comment: Rooom air  BMI 32.93 kg/m   GENERAL: Obese woman, no acute distress, fully ambulatory. HEAD: Normocephalic, atraumatic.  EYES: Pupils equal, round, reactive to light.  No scleral icterus.  MOUTH: Nose/mouth/throat not examined due to masking requirements for COVID 19. NECK: Supple. No thyromegaly. Trachea midline. No JVD.  No adenopathy. PULMONARY: Good air entry bilaterally.  Lungs clear to auscultation bilaterally. CARDIOVASCULAR: S1 and S2. Regular rate and rhythm.  No rubs, murmurs or gallops. GASTROINTESTINAL: Benign. MUSCULOSKELETAL: No joint deformity, no clubbing, no edema.  NEUROLOGIC: No focal deficits, no gait disturbance.  Speech is fluent. SKIN: Intact,warm,dry.  Limited exam, no rashes PSYCH: Mood and behavior appropriate.  Representative image from CT chest obtained 01 March 2020, independently reviewed:      Significant tracheal deviation noted with this mediastinal goiter.  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 Chronic cough  R05    This is likely related to her goiter issues Trial of Spiriva to see if this abolishes her cough reflex some Recommend excision of mediastinal goiter   Meds ordered this encounter  Medications  . Tiotropium Bromide Monohydrate (SPIRIVA RESPIMAT) 1.25 MCG/ACT AERS    Sig: Inhale 2 puffs into the lungs daily.    Dispense:  4 g    Refill:  0    Order Specific Question:   Lot Number?    Answer:   [id number]    Order Specific Question:   Expiration Date?    Answer:   09/01/2021    Order Specific Question:   Quantity    Answer:   2   Discussion:  The patient's issues with cough are likely related to a large mediastinal goiter that is creating significant tracheal  deviation.  I suspect that with recumbent position this becomes more pronounced and this aggravates her cough.  I recommend that she have this excised.  Referral will be made to thoracic surgery.   We will see the patient in follow-up in 2 to 3 months time, she is to call sooner should any new difficulties arise.  Also to notify us of how the Spiriva does for her.   Renold Don, MD [facility]   *This note was dictated using voice recognition software/Dragon.  Despite best efforts to proofread, errors can occur which can change the meaning.  Any change was purely unintentional.

## 2020-03-22 NOTE — Patient Instructions (Addendum)
I am giving you a trial of Spiriva, 2 puffs in the evening to see how this works.  I will communicate with Dr. Honor Junes and also with Dr. Ancil Boozer with regards to the issues with your thyroid.  We will see you in follow-up in 2 to 3 months time call sooner should any new problems arise.  Let us know how the Spiriva does for you.

## 2020-03-29 ENCOUNTER — Encounter: Payer: Self-pay | Admitting: Pulmonary Disease

## 2020-03-29 ENCOUNTER — Other Ambulatory Visit: Payer: Self-pay

## 2020-03-29 DIAGNOSIS — E048 Other specified nontoxic goiter: Secondary | ICD-10-CM

## 2020-03-29 NOTE — Progress Notes (Signed)
Referral has been placed to Dr. Genevive Bi per Dr. Patsey Berthold verbally. Pt has been made aware by Dr. Patsey Berthold.

## 2020-04-06 ENCOUNTER — Ambulatory Visit (INDEPENDENT_AMBULATORY_CARE_PROVIDER_SITE_OTHER): Payer: Medicare Other | Admitting: Cardiothoracic Surgery

## 2020-04-06 ENCOUNTER — Encounter: Payer: Self-pay | Admitting: Cardiothoracic Surgery

## 2020-04-06 ENCOUNTER — Other Ambulatory Visit: Payer: Self-pay

## 2020-04-06 VITALS — BP 122/80 | HR 77 | Temp 98.3°F | Ht 66.0 in | Wt 203.0 lb

## 2020-04-06 DIAGNOSIS — E042 Nontoxic multinodular goiter: Secondary | ICD-10-CM

## 2020-04-06 NOTE — Patient Instructions (Addendum)
We will schedule you to see Dr Celine Ahr for her discussion on surgery.   We will attempt to get your records for seeding your thyroid at [facility]. These may not be available.

## 2020-04-06 NOTE — Progress Notes (Signed)
Patient ID: Lauren Johnston, female   DOB: [birth year], 69 y.o.   MRN: [id number]  Chief Complaint  Patient presents with  . New Patient (Initial Visit)    Mediastinal mass, enlarged thyroid    Referred By Dr. Duwayne Heck Reason for Referral goiter   HPI Location, Quality, Duration, Severity, Timing, Context, Modifying Factors, Associated Signs and Symptoms.  Lauren Johnston is a 69 y.o. female.  One year or more history of cough.  Multiple medications presribed without significant improvement.  Ultimately had a chest CT done.  Large left lobe of thyroid with some tracheal deviation.  Seen by Dr. Duwayne Heck and goiter felt likely cause of symptoms.  No stridor per se.  Had radioactive Iodine about 25 years ago for presumed Grave's disease.  No records.  Has been on thryoid replacement ever since.  Short of breath when lying supine but not upright.  Some sputum production with cough but no blood.   Past Medical History:  Diagnosis Date  . Arthritis    hips  . Dental crowns present    dental implants - upper  . Depression   . GERD (gastroesophageal reflux disease)   . Hyperlipidemia   . Hypothyroidism   . Thyroid disease     Past Surgical History:  Procedure Laterality Date  . ABDOMINAL HYSTERECTOMY    . BILATERAL OOPHORECTOMY    . COLONOSCOPY WITH PROPOFOL N/A 06/02/2016   Procedure: COLONOSCOPY WITH PROPOFOL;  Surgeon: Lucilla Lame, MD;  Location: [place];  Service: Endoscopy;  Laterality: N/A;  . dequavian tendonitis    . KNEE ARTHROSCOPY    . POLYPECTOMY  06/02/2016   Procedure: POLYPECTOMY;  Surgeon: Lucilla Lame, MD;  Location: [place];  Service: Endoscopy;;    Family History  Problem Relation Age of Onset  . Diabetes Mother   . Heart disease Mother   . Emphysema Father   . Heart disease Father   . COPD Father   . Multiple myeloma Brother   . Alzheimer's disease Sister   . Dementia Sister     Social History Social History   Tobacco Use  . Smoking  status: Never Smoker  . Smokeless tobacco: Never Used  Vaping Use  . Vaping Use: Never used  Substance Use Topics  . Alcohol use: No    Alcohol/week: 0.0 standard drinks  . Drug use: No    Allergies  Allergen Reactions  . Sulfamethoxazole-Trimethoprim Other (See Comments) and Rash  . Citalopram Itching  . Tetracycline Other (See Comments)    Other Reaction: URTICARIA  . Tetracyclines & Related Itching  . Codeine Itching    Current Outpatient Medications  Medication Sig Dispense Refill  . aspirin 81 MG tablet Take 81 mg by mouth daily.    Marland Kitchen atorvastatin (LIPITOR) 20 MG tablet Take 1 tablet (20 mg total) by mouth daily. 90 tablet 1  . Biotin w/ Vitamins C & E (HAIR SKIN & NAILS GUMMIES PO) Take by mouth. Taking every other day    . Calcium Carb-Cholecalciferol (CALCIUM 1000 + D PO) Take by mouth. Taking every other day    . DULoxetine (CYMBALTA) 60 MG capsule Take 1 capsule (60 mg total) by mouth daily. 90 capsule 1  . estradiol (ESTRACE) 0.5 MG tablet TAKE 1 TABLET BY MOUTH EVERY DAY 90 tablet 0  . levothyroxine (SYNTHROID, LEVOTHROID) 50 MCG tablet Take 1 tablet (50 mcg total) by mouth daily before breakfast. 90 tablet 0  . omeprazole (PRILOSEC) 20 MG capsule Take 1  capsule (20 mg total) by mouth daily. 30 capsule 11  . Tiotropium Bromide Monohydrate (SPIRIVA RESPIMAT) 1.25 MCG/ACT AERS Inhale 2 puffs into the lungs daily. 4 g 0  . traZODone (DESYREL) 50 MG tablet TAKE 0.5-1 TABLETS (25-50 MG TOTAL) BY MOUTH AT BEDTIME AS NEEDED FOR SLEEP. 90 tablet 0   No current facility-administered medications for this visit.      Review of Systems A complete review of systems was asked and was negative except for the following positive findingscough, shortness of breath when lying flat.  Blood pressure 122/80, pulse 77, temperature 98.3 F (36.8 C), height _0  (1.676 m), weight 203 lb (92.1 kg), SpO2 95 %.  Physical Exam CONSTITUTIONAL:  Pleasant, well-developed, well-nourished,  and in no acute distress. EYES: Pupils equal and reactive to light, Sclera non-icteric EARS, NOSE, MOUTH AND THROAT:  The oropharynx was clear.  Dentition is good repair.  Oral mucosa pink and moist. LYMPH NODES:  Lymph nodes in the neck and axillae were normal.  I can not palpate her goiter RESPIRATORY:  Lungs were clear.  Normal respiratory effort without pathologic use of accessory muscles of respiration CARDIOVASCULAR: Heart was regular without murmurs.  There were no carotid bruits. GI: The abdomen was soft, nontender, and nondistended. There were no palpable masses. There was no hepatosplenomegaly. There were normal bowel sounds in all quadrants. GU:  Rectal deferred.   MUSCULOSKELETAL:  Normal muscle strength and tone.  No clubbing or cyanosis.   SKIN:  There were no pathologic skin lesions.  There were no nodules on palpation. NEUROLOGIC:  Sensation is normal.  Cranial nerves are grossly intact. PSYCH:  Oriented to person, place and time.  Mood and affect are normal.  Data Reviewed CT scan  I have personally reviewed the patient's imaging, laboratory findings and medical records.    Assessment    Goiter    Plan    Given her prior history of radioactive iodine therapy, I would like her to see Caesar Bookman M.D. in our office.  We have made that appointment for next week.       Nestor Lewandowsky, MD 04/06/2020, 12:01 PM

## 2020-04-10 ENCOUNTER — Ambulatory Visit: Payer: Medicare Other | Admitting: General Surgery

## 2020-04-12 ENCOUNTER — Ambulatory Visit (INDEPENDENT_AMBULATORY_CARE_PROVIDER_SITE_OTHER): Payer: Medicare Other | Admitting: General Surgery

## 2020-04-12 ENCOUNTER — Encounter: Payer: Self-pay | Admitting: General Surgery

## 2020-04-12 ENCOUNTER — Other Ambulatory Visit: Payer: Self-pay

## 2020-04-12 VITALS — BP 124/78 | HR 73 | Temp 97.8°F | Ht 66.0 in | Wt 202.4 lb

## 2020-04-12 DIAGNOSIS — E042 Nontoxic multinodular goiter: Secondary | ICD-10-CM

## 2020-04-12 NOTE — Patient Instructions (Addendum)
Dr.Cannon discussed with patient the surgical treatment. She also discussed with patient that if she may not be able to proceed with the surgery here at [facility], that she discussed with the patient that she would refer patient out to either [facility] or [facility] for surgical treatment.   Goiter  A goiter is an enlarged thyroid gland. The thyroid is located in the lower front of the neck. It makes hormones that affect many body parts and systems, including the system that affects how quickly the body burns fuel for energy (metabolism). Most goiters are painless and are not a cause for concern. Some goiters can affect the way your thyroid makes thyroid hormones. Goiters and conditions that cause goiters can be treated, if necessary. What are the causes? Common causes of this condition include:  Lack (deficiency) of a mineral called iodine. The thyroid gland uses iodine to make thyroid hormones.  Diseases that attack healthy cells in the body (autoimmune diseases) and affect thyroid function, such as Graves' disease or Hashimoto's disease. These diseases may cause the body to produce too much thyroid hormone (hyperthyroidism) or too little of the hormone (hypothyroidism).  Conditions that cause inflammation of the thyroid (thyroiditis).  One or more small growths on the thyroid (nodular goiter). Other causes include:  Medical problems caused by abnormal genes that are passed from parent to child (genetic defects).  Thyroid injury or infection.  Tumors that may or may not be cancerous.  Pregnancy.  Certain medicines.  Exposure to radiation. In some cases, the cause may not be known. What increases the risk? This condition is more likely to develop in:  People who do not get enough iodine in their diet.  People who have a family history of goiter.  Women.  People who are older than age 47.  People who smoke tobacco.  People who have had exposure to radiation. What are the signs or  symptoms? The main symptom of this condition is swelling in the lower, front part of the neck. This swelling can range from a very small bump to a large lump. Other symptoms may include:  A tight feeling in the throat.  A hoarse voice.  Coughing.  Wheezing.  Difficulty swallowing or breathing.  Bulging veins in the neck.  Dizziness. When a goiter is the result of an overactive thyroid (hyperthyroidism), symptoms may also include:  Nervousness or restlessness.  Inability to tolerate heat.  Unexplained weight loss.  Diarrhea.  Change in the texture of hair or skin.  Changes in heartbeat, such as skipped beats, extra beats, or a rapid heart rate.  Loss of menstruation.  Shaky hands.  Increased appetite.  Sleep problems. When a goiter is the result of an underactive thyroid (hypothyroidism), symptoms may also include:  Feeling like you have no energy (lethargy).  Inability to tolerate cold.  Weight gain that is not explained by a change in diet or exercise habits.  Dry skin.  Coarse hair.  Irregular menstrual periods.  Constipation.  Sadness or depression.  Fatigue. In some cases, there may not be any symptoms and the thyroid hormone levels may be normal. How is this diagnosed? This condition may be diagnosed based on your symptoms, your medical history, and a physical exam. You may have tests, such as:  Blood tests to check thyroid function.  Imaging tests, such as: ? Ultrasound. ? CT scan. ? MRI. ? Thyroid scan.  Removal of a tissue sample (biopsy) of the goiter or any nodules. The sample will be tested  to check for cancer. How is this treated? Treatment for this condition depends on the cause and your symptoms. Treatment may include:  Medicines to regulate thyroid hormone levels.  Anti-inflammatory medicines or steroid medicines, if the goiter is caused by inflammation.  Iodine supplements or changes to your diet, if the goiter is caused by  iodine deficiency.  Radioactive iodine treatment.  Surgery to remove your thyroid. In some cases, you may only need regular check-ups with your health care provider to monitor your condition, and you may not need treatment. Follow these instructions at home:  Follow instructions from your health care provider about any changes to your diet.  Take over-the-counter and prescription medicines only as told by your health care provider. These include supplements.  Do not use any products that contain nicotine or tobacco, such as cigarettes and e-cigarettes. If you need help quitting, ask your health care provider.  Keep all follow-up visits as told by your health care provider. This is important. Contact a health care provider if:  Your symptoms do not get better with treatment.  You have nausea, vomiting, or diarrhea. Get help right away if:  You have sudden, unexplained confusion or other mental changes.  You have a fever.  You have chest pain.  You have trouble breathing or swallowing.  You suddenly become very weak.  You experience extreme restlessness.  You feel your heart racing. Summary  A goiter is an enlarged thyroid gland.  The thyroid gland is located in the lower front of the neck. It makes hormones that affect many body parts and systems, including the system that affects how quickly the body burns fuel for energy (metabolism).  The main symptom of this condition is swelling in the lower, front part of the neck. This swelling can range from a very small bump to a large lump.  Treatment for this condition depends on the cause and your symptoms. You may need medicines, supplements, or regular monitoring of your condition. This information is not intended to replace advice given to you by your health care provider. Make sure you discuss any questions you have with your health care provider. Document Revised: 07/31/2017 Document Reviewed: 05/14/2017 Elsevier Patient  Education  2020 [employer].

## 2020-04-12 NOTE — Progress Notes (Signed)
Patient ID: Lauren Johnston, female   DOB: [birth year], 69 y.o.   MRN: [id number]  Chief Complaint  Patient presents with   Follow-up     Ref from Dr Genevive Bi, current pt, enlarged Thyroid, chest mass    HPI [name] is a 69 y.o. female.   She is here today for further evaluation and management of a large, partially substernal goiter.  She has a past medical history significant for Graves' disease, treated with radioactive iodine.  She has been followed by Dr. Honor Junes at the [facility] for post ablation thyroid hormone management.  She currently takes 50 mcg of levothyroxine daily.  Over the past 2 years or so, she has developed a chronic cough, most predominant at night while she is supine.  This is been evaluated by a number of providers.  She recently saw Dr. Patsey Berthold at [facility] pulmonology.  Pulmonary function testing was performed, along with a CT scan of the chest.  This demonstrated rightward tracheal deviation and a upper mediastinal mass consistent with a goiter.  The impression from pulmonology was that her cough was most likely secondary to tracheal irritation from the goiter.  She was referred to see my partner, Dr. Genevive Bi, for further evaluation and potential surgical intervention.  He subsequently referred her to see me.  She is here today for further discussion of operative intervention.  Since treatment for her Lauren Johnston' disease, she denies any heart palpitations or hand tremors.  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  No change in the texture of her skin or fingernails.  She denies heat or cold intolerance.  Her weight has been stable.  Her primary complaint is coughing while supine.  She does not endorse any dyspnea in the supine position.  No voice changes or dysphagia.  No known family history of thyroid malignancy.   Past Medical History:  Diagnosis Date   Arthritis    hips   Dental crowns present    dental implants - upper   Depression    GERD  (gastroesophageal reflux disease)    Hyperlipidemia    Hypothyroidism    Thyroid disease     Past Surgical History:  Procedure Laterality Date   ABDOMINAL HYSTERECTOMY     BILATERAL OOPHORECTOMY     COLONOSCOPY WITH PROPOFOL N/A 06/02/2016   Procedure: COLONOSCOPY WITH PROPOFOL;  Surgeon: Lucilla Lame, MD;  Location: [place];  Service: Endoscopy;  Laterality: N/A;   dequavian tendonitis     KNEE ARTHROSCOPY     POLYPECTOMY  06/02/2016   Procedure: POLYPECTOMY;  Surgeon: Lucilla Lame, MD;  Location: [place];  Service: Endoscopy;;    Family History  Problem Relation Age of Onset   Diabetes Mother    Heart disease Mother    Emphysema Father    Heart disease Father    COPD Father    Multiple myeloma Brother    Alzheimer's disease Sister    Dementia Sister     Social History Social History   Tobacco Use   Smoking status: Never Smoker   Smokeless tobacco: Never Used  Scientific laboratory technician Use: Never used  Substance Use Topics   Alcohol use: No    Alcohol/week: 0.0 standard drinks   Drug use: No    Allergies  Allergen Reactions   Sulfamethoxazole-Trimethoprim Other (See Comments) and Rash   Citalopram Itching   Tetracycline Other (See Comments)    Other Reaction: URTICARIA   Tetracyclines & Related Itching  Codeine Itching    Current Outpatient Medications  Medication Sig Dispense Refill   aspirin 81 MG tablet Take 81 mg by mouth daily.     atorvastatin (LIPITOR) 20 MG tablet Take 1 tablet (20 mg total) by mouth daily. 90 tablet 1   Biotin w/ Vitamins C & E (HAIR SKIN & NAILS GUMMIES PO) Take by mouth. Taking every other day     Calcium Carb-Cholecalciferol (CALCIUM 1000 + D PO) Take by mouth. Taking every other day     DULoxetine (CYMBALTA) 60 MG capsule Take 1 capsule (60 mg total) by mouth daily. 90 capsule 1   estradiol (ESTRACE) 0.5 MG tablet TAKE 1 TABLET BY MOUTH EVERY DAY 90 tablet 0   levothyroxine  (SYNTHROID, LEVOTHROID) 50 MCG tablet Take 1 tablet (50 mcg total) by mouth daily before breakfast. 90 tablet 0   omeprazole (PRILOSEC) 20 MG capsule Take 1 capsule (20 mg total) by mouth daily. 30 capsule 11   Tiotropium Bromide Monohydrate (SPIRIVA RESPIMAT) 1.25 MCG/ACT AERS Inhale 2 puffs into the lungs daily. 4 g 0   traZODone (DESYREL) 50 MG tablet TAKE 0.5-1 TABLETS (25-50 MG TOTAL) BY MOUTH AT BEDTIME AS NEEDED FOR SLEEP. 90 tablet 0   No current facility-administered medications for this visit.    Review of Systems Review of Systems  Respiratory: Positive for cough.   All other systems reviewed and are negative.   Blood pressure 124/78, pulse 73, temperature 97.8 F (36.6 C), temperature source Oral, height 5' 6" (1.676 m), weight 202 lb 6.4 oz (91.8 kg), SpO2 98 %. Body mass index is 32.67 kg/m.  Physical Exam Physical Exam Constitutional:      General: She is not in acute distress.    Appearance: Normal appearance. She is obese.  HENT:     Head: Normocephalic and atraumatic.     Nose:     Comments: Covered with a mask    Mouth/Throat:     Comments: Covered with a mask Eyes:     General: No scleral icterus.       Right eye: No discharge.        Left eye: No discharge.     Comments: No proptosis or exophthalmos.  No lid lag or stare.  No periorbital edema.  Neck:     Comments: No palpable cervical or supraclavicular lymphadenopathy.  The cervical trachea feels more or less midline.  The thyroid gland moves freely with deglutition.  When I position the patient supine with her neck extended, I am able to palpate the inferior border of the goiter, just at the sternal notch. Cardiovascular:     Rate and Rhythm: Normal rate and regular rhythm.     Pulses: Normal pulses.  Pulmonary:     Effort: Pulmonary effort is normal.     Breath sounds: Normal breath sounds.  Abdominal:     General: Bowel sounds are normal.     Palpations: Abdomen is soft.  Genitourinary:     Comments: Deferred Musculoskeletal:        General: No swelling or tenderness.     Comments: No pretibial dermopathy  Skin:    General: Skin is warm and dry.  Neurological:     General: No focal deficit present.     Mental Status: She is oriented to person, place, and time.  Psychiatric:        Mood and Affect: Mood normal.        Behavior: Behavior normal.     Data  Reviewed I reviewed the pulmonary function test performed on March 15, 2020, with the results having been discussed with the patient by Dr. Patsey Berthold.  Results for VERNETA, HAMIDI (MRN [id number]) as of 04/20/2020 11:24  Ref. Range 03/06/2016 10:49 07/09/2016 12:33 05/12/2017 11:14 12/16/2017 10:26 10/24/2019 08:37  TSH Latest Ref Range: 0.40 - 4.50 mIU/L 1.960 0.77 2.01 1.24 2.98  These results are normal and suggest adequate thyroid hormone replacement.  I reviewed the results of a thyroid ultrasound performed February 23, 2019.  The actual images are not available for my review.  Indication: Multinodular goiter   Date: 02/23/2019  Comparison: 05/20/2018 and 02/18/2017   Technique: Gray-scale and color Doppler images of the neck were obtained.   Findings:  THYROID:  The right lobe of the thyroid measures 2.4 x 1.3 x 1.3 cm.  The left lobe of the thyroid measures 5.9 x 3.1 x 4.3 cm.  The isthmus measures 0.3 cm in AP depth.   Within the right lobe, a well-circumscribed mid pole hypoechoic nodule is  seen measuring 1.2 x 1.3 x 1.2 cm (1.4x1.3x1.1 cm in 9/19 and 1.4x1.3x1.6  in 6/18)    Within the left lobe, a dominant mid/lower pole nodule is seen measuring  4.4 x 3.4 x 4.4 cm (4.2x3.3x4.1 cm in 9/19 and 5.5x2.9x1.8 cm in 6/18).   An upper pole nodule was also noted measuring 0.9 x 1.6 x 0.7 cm  (1.1x1.6x1.8 cm in 9/19)    There are no significantly enlarged lymph nodes in the neck.    Impression:  Multinodular goiter as described above. No significant change has  occurred since ultrasound in 9/19 and 6/18.  Recommend continued  surveillance.    I reviewed Dr. Sherren Mocha clinic note from February 22, 2020.  This describes the fact that the patient has had prior biopsies of the nodules with in the bilateral thyroid lobes, both of which have been benign.  There is also a telephone note from Dr. Honor Junes dated March 29, 2020.  He communicated with Dr. Patsey Berthold regarding the findings on the CT scan.  According to Ms. Schaafsma, this communication resulted in Dr. Sherren Mocha agreement that she should pursue surgery.  I also personally reviewed the images associated with the CT scan of the chest performed March 01, 2020.  I concur with the radiologist impression of a substernal thyroid mass extending from the left lobe.  Assessment This is a 69 year old woman with a prior history of Graves' disease, status post radioactive iodine ablation.  Despite this treatment, she has had progressive growth of a multinodular goiter.  She also has had a chronic cough with phlegm production, worse while in the supine position.  Further evaluation of this concern led to pulmonology evaluation.  CT scan of the chest demonstrated partial substernal extension of the left lobe of the thyroid along with some tracheal deviation.  There is no tracheal compression and I do not have any concern for tracheomalacia.  When the patient is in the supine position with her neck extended, the substernal component of the goiter elevates to the level of the sternal notch.  I have recommended that she undergo total thyroidectomy.  Plan The risks of thyroid surgery were discussed, including (but not limited to): bleeding, infection, damage to surrounding structures/tissues, injury (temporary or permanent) to the recurrent laryngeal nerve, hypoparathyroidism (temporary or permanent), need for thyroid hormone replacement therapy, need for additional surgery and/or treatment, recurrence of disease, tracheostomy (temporary or permanent).  The patient had the  opportunity to  ask any questions and these were answered to their satisfaction.  I specifically discussed this patient with Dr. Vashti Hey, of the anesthesiology division.  I reviewed her films as well as discussed with him the physical exam findings.  He is in agreement that we can proceed with operative intervention at [facility].  We will work on getting her scheduled.    Fredirick Maudlin 04/12/2020, 3:36 PM

## 2020-04-16 ENCOUNTER — Ambulatory Visit (INDEPENDENT_AMBULATORY_CARE_PROVIDER_SITE_OTHER): Payer: Medicare Other | Admitting: Family Medicine

## 2020-04-16 ENCOUNTER — Other Ambulatory Visit: Payer: Self-pay

## 2020-04-16 ENCOUNTER — Encounter: Payer: Self-pay | Admitting: Family Medicine

## 2020-04-16 VITALS — BP 118/80 | HR 85 | Temp 98.4°F | Resp 16 | Ht 66.0 in | Wt 202.1 lb

## 2020-04-16 DIAGNOSIS — F5101 Primary insomnia: Secondary | ICD-10-CM

## 2020-04-16 DIAGNOSIS — M7541 Impingement syndrome of right shoulder: Secondary | ICD-10-CM

## 2020-04-16 DIAGNOSIS — R053 Chronic cough: Secondary | ICD-10-CM

## 2020-04-16 DIAGNOSIS — E7800 Pure hypercholesterolemia, unspecified: Secondary | ICD-10-CM | POA: Diagnosis not present

## 2020-04-16 DIAGNOSIS — N1831 Chronic kidney disease, stage 3a: Secondary | ICD-10-CM

## 2020-04-16 DIAGNOSIS — E559 Vitamin D deficiency, unspecified: Secondary | ICD-10-CM

## 2020-04-16 DIAGNOSIS — R05 Cough: Secondary | ICD-10-CM

## 2020-04-16 DIAGNOSIS — E890 Postprocedural hypothyroidism: Secondary | ICD-10-CM

## 2020-04-16 DIAGNOSIS — E66811 Obesity, class 1: Secondary | ICD-10-CM

## 2020-04-16 DIAGNOSIS — E048 Other specified nontoxic goiter: Secondary | ICD-10-CM

## 2020-04-16 DIAGNOSIS — E669 Obesity, unspecified: Secondary | ICD-10-CM

## 2020-04-16 DIAGNOSIS — E042 Nontoxic multinodular goiter: Secondary | ICD-10-CM

## 2020-04-16 DIAGNOSIS — F325 Major depressive disorder, single episode, in full remission: Secondary | ICD-10-CM

## 2020-04-16 DIAGNOSIS — M170 Bilateral primary osteoarthritis of knee: Secondary | ICD-10-CM

## 2020-04-16 MED ORDER — DULOXETINE HCL 60 MG PO CPEP: 60.0000 mg | ORAL_CAPSULE | Freq: Every day | ORAL | 1 refills | Status: DC

## 2020-04-16 MED ORDER — ATORVASTATIN CALCIUM 40 MG PO TABS: 40.0000 mg | ORAL_TABLET | Freq: Every day | ORAL | 1 refills | Status: DC

## 2020-04-16 MED ORDER — TRAZODONE HCL 50 MG PO TABS: 25.0000 mg | ORAL_TABLET | Freq: Every evening | ORAL | 0 refills | Status: DC | PRN

## 2020-04-16 NOTE — Progress Notes (Signed)
Name: Lauren Johnston   MRN: [id number]    DOB: 1951-06-08   Date:04/16/2020       Progress Note  Subjective  Chief Complaint  Chief Complaint  Patient presents with  . Follow-up  . 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 Celine Ahr 04/12/2020 . She continues to have a productive nocturnal cough and has to spit it up during the night. No wheezing or SOB. History of goiter and had iodine therapy many years ago . She is on thyroid supplementation. Dr. Honor Junes agrees with surgery   Right shoulder pain: she does not recall any injury, she has noticed over the past month she has noticed pain with abduction of right shoulder, she has been taking Tylenol  at night to help with pain. No tingling, numbness or weakness.   Hyperlipidemia: taking Atorvastatin, reviewed last labs , LDL was 94, HDL was low, discussed increase in fish, tree nuts intake  OA both knees:stable, taking tylenol prn now, no effusion or redness. Unchanged    Insomnia/Memory problems: she states she has been taking a half pill every night now and is sleeping better, she still wakes up during the night but falls back asleep , she is afraid of affecting her memory. She had a medicare wellness recently and CIT 6 was 2  CKI stage III: discussed avoiding NSAID's and drink more water, taking Tylenol prnLast GFR was over one yar ago at 71, previous was 56  . She denies pruritis or decrease in urine output   Major Depression: She was very stressed when her sister moved to [facility] in 2016/12/03, she was living independently until Feb 2020, she was  [facility] but since [place] she was transferred to [facility] but had a fall and brain bleed and had brain bleed and went to hospice for a period of time, but is  now at [facility]. She was the caregiver for her husband that died in December 04, 2011 .  She states she has been carrying for her sister's grandson and is  helping her being active and also her mood. She states her husband is very supportive The great-nephew is a toddler and gives her joy   Patient Active Problem List   Diagnosis Date Noted  . Class 1 obesity in adult 11/11/2016  . Osteopenia 10/09/2016  . Special screening for malignant neoplasms, colon   . Rectal polyp   . Right wrist tendonitis 05/16/2016  . Osteoarthritis of left hip 05/16/2016  . Vitamin D deficiency 02/05/2016  . Degeneration of intervertebral disc of cervical region 02/05/2016  . Insomnia 04/23/2015  . Major depression in remission ([place]) 04/23/2015  . Chronic pain 04/23/2015  . Goiter, nontoxic, 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  . 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 Uterine leiomyoma 06/16/2007    Past Surgical History:  Procedure Laterality Date  . ABDOMINAL HYSTERECTOMY    . BILATERAL OOPHORECTOMY    . COLONOSCOPY WITH PROPOFOL N/A 06/02/2016   Procedure: COLONOSCOPY WITH PROPOFOL;  Surgeon: Lucilla Lame, MD;  Location: [facility];  Service: Endoscopy;  Laterality: N/A;  . dequavian tendonitis    . KNEE ARTHROSCOPY    . POLYPECTOMY  06/02/2016   Procedure: POLYPECTOMY;  Surgeon: Lucilla Lame, MD;  Location: [facility];  Service: Endoscopy;;    Family History  Problem Relation Age of Onset  . Diabetes Mother   . Heart disease Mother   .  Emphysema Father   . Heart disease Father   . COPD Father   . Multiple myeloma Brother   . Alzheimer's disease Sister   . Dementia Sister     Social History   Tobacco Use  . Smoking status: Never Smoker  . Smokeless tobacco: Never Used  Substance Use Topics  . Alcohol use: No    Alcohol/week: 0.0 standard drinks     Current Outpatient Medications:  .  aspirin 81 MG tablet, Take 81 mg by mouth daily., Disp: , Rfl:  .  atorvastatin (LIPITOR) 20 MG tablet, Take 1 tablet (20 mg total) by mouth daily., Disp: 90 tablet, Rfl: 1 .  Biotin w/ Vitamins C & E (HAIR SKIN & NAILS GUMMIES PO), Take by  mouth. Taking every other day, Disp: , Rfl:  .  Calcium Carb-Cholecalciferol (CALCIUM 1000 + D PO), Take by mouth. Taking every other day, Disp: , Rfl:  .  DULoxetine (CYMBALTA) 60 MG capsule, Take 1 capsule (60 mg total) by mouth daily., Disp: 90 capsule, Rfl: 1 .  estradiol (ESTRACE) 0.5 MG tablet, TAKE 1 TABLET BY MOUTH EVERY DAY, Disp: 90 tablet, Rfl: 0 .  levothyroxine (SYNTHROID, LEVOTHROID) 50 MCG tablet, Take 1 tablet (50 mcg total) by mouth daily before breakfast., Disp: 90 tablet, Rfl: 0 .  Tiotropium Bromide Monohydrate (SPIRIVA RESPIMAT) 1.25 MCG/ACT AERS, Inhale 2 puffs into the lungs daily., Disp: 4 g, Rfl: 0 .  traZODone (DESYREL) 50 MG tablet, TAKE 0.5-1 TABLETS (25-50 MG TOTAL) BY MOUTH AT BEDTIME AS NEEDED FOR SLEEP., Disp: 90 tablet, Rfl: 0 .  omeprazole (PRILOSEC) 20 MG capsule, Take 1 capsule (20 mg total) by mouth daily. (Patient not taking: Reported on 04/16/2020), Disp: 30 capsule, Rfl: 11  Allergies  Allergen Reactions  . Sulfamethoxazole-Trimethoprim Other (See Comments) and Rash  . Citalopram Itching  . Tetracycline Other (See Comments)    Other Reaction: URTICARIA  . Tetracyclines & Related Itching  . Codeine Itching    I personally reviewed active problem list, medication list, allergies, family history, social history, health maintenance with the patient/caregiver today.   ROS  Constitutional: Negative for fever or weight change.  Respiratory: positive  for cough but no  shortness of breath.   Cardiovascular: Negative for chest pain or palpitations.  Gastrointestinal: Negative for abdominal pain, no bowel changes.  Musculoskeletal: Negative for gait problem or joint swelling.  Skin: Negative for rash.  Neurological: Negative for dizziness or headache.  No other specific complaints in a complete review of systems (except as listed in HPI above).  Objective  Vitals:   04/16/20 1137  BP: 118/80  Pulse: 85  Resp: 16  Temp: 98.4 F (36.9 C)  TempSrc:  Oral  SpO2: 98%  Weight: 202 lb 1.6 oz (91.7 kg)  Height: '5\' 6"'  (1.676 m)    Body mass index is 32.62 kg/m.  Physical Exam  Constitutional: Patient appears well-developed and well-nourished. Obese  No distress.  HEENT: head atraumatic, normocephalic, pupils equal and reactive to light, neck supple Cardiovascular: Normal rate, regular rhythm and normal heart sounds.  No murmur heard. No BLE edema. Pulmonary/Chest: Effort normal and breath sounds normal. No respiratory distress. Abdominal: Soft.  There is no tenderness. Psychiatric: Patient has a normal mood and affect. behavior is normal. Judgment and thought content normal.  Recent Results (from the past 2160 hour(s))  I-STAT creatinine     Status: Abnormal   Collection Time: 03/01/20  9:31 AM  Result Value Ref Range   Creatinine,  Ser 1.30 (H) 0.44 - 1.00 mg/dL  SARS CORONAVIRUS 2 (TAT 6-24 HRS) Nasopharyngeal Nasopharyngeal Swab     Status: None   Collection Time: 03/13/20  8:37 AM   Specimen: Nasopharyngeal Swab  Result Value Ref Range   SARS Coronavirus 2 NEGATIVE NEGATIVE    Comment: (NOTE) SARS-CoV-2 target nucleic acids are NOT DETECTED.  The SARS-CoV-2 RNA is generally detectable in upper and lower respiratory specimens during the acute phase of infection. Negative results do not preclude SARS-CoV-2 infection, do not rule out co-infections with other pathogens, and should not be used as the sole basis for treatment or other patient management decisions. Negative results must be combined with clinical observations, patient history, and epidemiological information. The expected result is Negative.  Fact Sheet for Patients: SugarRoll.be  Fact Sheet for Healthcare Providers: https://www.woods-mathews.com/  This test is not yet approved or cleared by the [place] FDA and  has been authorized for detection and/or diagnosis of SARS-CoV-2 by FDA under an Emergency Use  Authorization (EUA). This EUA will remain  in effect (meaning this test can be used) for the duration of the COVID-19 declaration under Se ction 564(b)(1) of the Act, 21 U.S.C. section 360bbb-3(b)(1), unless the authorization is terminated or revoked sooner.  Performed at [facility], [place] [address], [place], [place] [address]   Pulmonary function test     Status: None   Collection Time: 03/15/20  9:23 AM  Result Value Ref Range   FVC-Pre 2.26 L   FVC-%Pred-Pre 85 %   FVC-Post 2.25 L   FVC-%Pred-Post 85 %   FVC-%Change-Post 0 %   FEV1-Pre 1.87 L   FEV1-%Pred-Pre 91 %   FEV1-Post 1.94 L   FEV1-%Pred-Post 94 %   FEV1-%Change-Post 3 %   FEV6-Pre 2.23 L   FEV6-%Pred-Pre 88 %   FEV6-Post 2.25 L   FEV6-%Pred-Post 88 %   FEV6-%Change-Post 0 %   Pre FEV1/FVC ratio 83 %   FEV1FVC-%Pred-Pre 106 %   Post FEV1/FVC ratio 86 %   FEV1FVC-%Change-Post 4 %   Pre FEV6/FVC Ratio 99 %   FEV6FVC-%Pred-Pre 102 %   Post FEV6/FVC ratio 100 %   FEV6FVC-%Pred-Post 103 %   FEV6FVC-%Change-Post 1 %   FEF 25-75 Pre 2.24 L/sec   FEF2575-%Pred-Pre 119 %   FEF 25-75 Post 2.75 L/sec   FEF2575-%Pred-Post 146 %   FEF2575-%Change-Post 22 %   RV 1.46 L   RV % pred 64 %   TLC 3.71 L   TLC % pred 69 %   DLCO unc 14.69 ml/min/mmHg   DLCO unc % pred 70 %   DL/VA 4.66 ml/min/mmHg/L   DL/VA % pred 114 %    PHQ2/9: Depression screen [facility] 2/9 04/16/2020 11/14/2019 09/29/2019 07/26/2019 05/06/2019  Decreased Interest 0 0 0 0 2  Down, Depressed, Hopeless 0 0 0 0 2  PHQ - 2 Score 0 0 0 0 4  Altered sleeping 0 0 0 - 1  Tired, decreased energy 0 0 0 - 2  Change in appetite 0 0 0 - 2  Feeling bad or failure about yourself  0 0 0 - 0  Trouble concentrating 0 0 0 - 0  Moving slowly or fidgety/restless 0 0 0 - 0  Suicidal thoughts 0 0 0 - 0  PHQ-9 Score 0 0 0 - 9  Difficult doing work/chores - - - - Somewhat difficult  Some recent data might be hidden    phq 9 is negative  Fall Risk: Fall  Risk   04/16/2020 04/12/2020 04/06/2020 11/14/2019 09/29/2019  Falls in the past year? 0 0 0 0 0  Number falls in past yr: 0 0 0 0 0  Injury with Fall? 0 0 0 0 0  Follow up - - - - -     Functional Status Survey: Is the patient deaf or have difficulty hearing?: No Does the patient have difficulty seeing, even when wearing glasses/contacts?: No Does the patient have difficulty concentrating, remembering, or making decisions?: No Does the patient have difficulty walking or climbing stairs?: No Does the patient have difficulty dressing or bathing?: No Does the patient have difficulty doing errands alone such as visiting a doctor's office or shopping?: No    Assessment & Plan   1. Chronic cough  Likely from mediastinal mass   2. Hypothyroidism, postablative  Under the care of Dr. Honor Junes   3. Primary insomnia  - traZODone (DESYREL) 50 MG tablet; Take 0.5-1 tablets (25-50 mg total) by mouth at bedtime as needed for sleep.  Dispense: 90 tablet; Refill: 0  4. Pure hypercholesterolemia  - atorvastatin (LIPITOR) 40 MG tablet; Take 1 tablet (40 mg total) by mouth daily.  Dispense: 90 tablet; Refill: 1  5. Stage 3a chronic kidney disease   6. Multinodular goiter   7. Primary osteoarthritis of both knees   8. Obesity (BMI 30.0-34.9)  Discussed with the patient the risk posed by an increased BMI. Discussed importance of portion control, calorie counting and at least 150 minutes of physical activity weekly. Avoid sweet beverages and drink more water. Eat at least 6 servings of fruit and vegetables daily   9. Vitamin D deficiency   10. Mediastinal goiter   11. Major depression in remission (HCC)  - DULoxetine (CYMBALTA) 60 MG capsule; Take 1 capsule (60 mg total) by mouth daily.  Dispense: 90 capsule; Refill: 1  12. Impingement syndrome of right shoulder  - Ambulatory referral to Physical Therapy

## 2020-04-17 ENCOUNTER — Telehealth: Payer: Self-pay | Admitting: General Surgery

## 2020-04-17 NOTE — Telephone Encounter (Signed)
Pt has been advised of Pre-Admission date/time, COVID Testing date and Surgery date.  Surgery Date: 05/21/20 Preadmission Testing Date: 05/11/20 (phone 8a-1p) Covid Testing Date: 05/17/20 - patient advised to go to the [facility] ([place] between 8a-1p   Patient has been made aware to call [phone], between 1-3:00pm the day before surgery, to find out what time to arrive for surgery.

## 2020-05-03 ENCOUNTER — Telehealth: Payer: Self-pay | Admitting: General Surgery

## 2020-05-03 NOTE — Telephone Encounter (Signed)
Updated information regarding new surgery date.  Had to be rescheduled per the OR due to rise of Covid in the hospital.  Patient has been advised of Pre-Admission date/time, COVID Testing date and Surgery date.  Surgery Date: 06/18/20 Preadmission Testing Date: 06/08/20 (phone 8a-1p) Covid Testing Date: 06/14/20 - patient advised to go to the [facility] ([place] between 8a-1p   Patient has been made aware to call [phone], between 1-3:00pm the day before surgery, to find out what time to arrive for surgery.

## 2020-05-11 ENCOUNTER — Other Ambulatory Visit: Payer: Medicare Other

## 2020-05-17 ENCOUNTER — Other Ambulatory Visit: Payer: Medicare Other

## 2020-05-18 NOTE — Progress Notes (Signed)
Appreciate general surgery's input.  Discussed previously with Dr. Celine Ahr.  Expertise appreciated.  Renold Don, MD [facility]

## 2020-06-08 ENCOUNTER — Inpatient Hospital Stay: Admission: RE | Admit: 2020-06-08 | Payer: Medicare Other | Source: Ambulatory Visit

## 2020-06-10 ENCOUNTER — Other Ambulatory Visit: Payer: Self-pay | Admitting: General Surgery

## 2020-06-10 DIAGNOSIS — E042 Nontoxic multinodular goiter: Secondary | ICD-10-CM

## 2020-06-12 ENCOUNTER — Encounter
Admission: RE | Admit: 2020-06-12 | Discharge: 2020-06-12 | Disposition: A | Discharge status: Home / Self Care | Payer: Medicare Other | Source: Ambulatory Visit | Attending: General Surgery | Admitting: General Surgery

## 2020-06-12 ENCOUNTER — Other Ambulatory Visit: Payer: Self-pay

## 2020-06-12 DIAGNOSIS — R54 Age-related physical debility: Secondary | ICD-10-CM | POA: Diagnosis not present

## 2020-06-12 DIAGNOSIS — Z20822 Contact with and (suspected) exposure to covid-19: Secondary | ICD-10-CM | POA: Diagnosis not present

## 2020-06-12 DIAGNOSIS — Z01818 Encounter for other preprocedural examination: Secondary | ICD-10-CM | POA: Diagnosis not present

## 2020-06-12 HISTORY — DX: Chronic kidney disease, unspecified: N18.9

## 2020-06-12 NOTE — Patient Instructions (Addendum)
Your procedure is scheduled on:06-18-20 MONDAY Report to Day Surgery on the 2nd floor of the [facility]. To find out your arrival time, please call [phone] between 1PM - 3PM on: 06-15-20 FRIDAY  REMEMBER: Instructions that are not followed completely may result in serious medical risk, up to and including death; or upon the discretion of your surgeon and anesthesiologist your surgery may need to be rescheduled.  Do not eat food after midnight the night before surgery.  No gum chewing, lozengers or hard candies.  You may however, drink CLEAR liquids up to 2 hours before you are scheduled to arrive for your surgery. Do not drink anything within 2 hours of your scheduled arrival time.  Clear liquids include: - water  - apple juice without pulp - gatorade (not RED) - black coffee or tea (Do NOT add milk or creamers to the coffee or tea) Do NOT drink anything that is not on this list.  TAKE THESE MEDICATIONS THE MORNING OF SURGERY WITH A SIP OF WATER: -SYNTHROID (LEVOTHYROXINE) -CYMBALTA (DULOXETINE)  Follow recommendations from Cardiologist, Pulmonologist or PCP regarding stopping Aspirin, Coumadin, Plavix, Eliquis, Pradaxa, or Pletal-STOP YOUR ASPIRIN NOW  One week prior to surgery: Stop Anti-inflammatories (NSAIDS) such as Advil, Aleve, Ibuprofen, Motrin, Naproxen, Naprosyn and Aspirin based products such as Excedrin, Goodys Powder, BC Powder-TYLENOL OK TO TAKE  Stop ANY OVER THE COUNTER supplements until after surgery-STOP YOUR BIOTIN NOW-YOU MAY RESUME AFTER SURGERY (You may continue taking Tylenol, Calcium, Vitamin D)  No Alcohol for 24 hours before or after surgery.  No Smoking including e-cigarettes for 24 hours prior to surgery.  No chewable tobacco products for at least 6 hours prior to surgery.  No nicotine patches on the day of surgery.  Do not use any "recreational" drugs for at least a week prior to your surgery.  Please be advised that the combination of  cocaine and anesthesia may have negative outcomes, up to and including death. If you test positive for cocaine, your surgery will be cancelled.  On the morning of surgery brush your teeth with toothpaste and water, you may rinse your mouth with mouthwash if you wish. Do not swallow any toothpaste or mouthwash.  Do not wear jewelry, make-up, hairpins, clips or nail polish.  Do not wear lotions, powders, or perfumes.   Do not shave 48 hours prior to surgery.   Contact lenses, hearing aids and dentures may not be worn into surgery.  Do not bring valuables to the hospital. [facility] is not responsible for any missing/lost belongings or valuables.   Use CHG Soap as directed on instruction sheet.  Notify your doctor if there is any change in your medical condition (cold, fever, infection).  Wear comfortable clothing (specific to your surgery type) to the hospital.  Plan for stool softeners for home use; pain medications have a tendency to cause constipation. You can also help prevent constipation by eating foods high in fiber such as fruits and vegetables and drinking plenty of fluids as your diet allows.  After surgery, you can help prevent lung complications by doing breathing exercises.  Take deep breaths and cough every 1-2 hours. Your doctor may order a device called an Incentive Spirometer to help you take deep breaths. When coughing or sneezing, hold a pillow firmly against your incision with both hands. This is called "splinting." Doing this helps protect your incision. It also decreases belly discomfort.  If you are being admitted to the hospital overnight, leave your suitcase in  the car. After surgery it may be brought to your room.  If you are being discharged the day of surgery, you will not be allowed to drive home. You will need a responsible adult (18 years or older) to drive you home and stay with you that night.   If you are taking public transportation, you will need  to have a responsible adult (18 years or older) with you. Please confirm with your physician that it is acceptable to use public transportation.   Please call the [facility] at [phone] if you have any questions about these instructions.  Visitation Policy:  Patients undergoing a surgery or procedure may have one family member or support person with them as long as that person is not COVID-19 positive or experiencing its symptoms.  That person may remain in the waiting area during the procedure.  Inpatient Visitation Update:   In an effort to ensure the safety of our team members and our patients, we are implementing a change to our visitation policy:  Effective Monday, Aug. 9, at 7 a.m., inpatients will be allowed one support person.  o The support person may change daily.  o The support person must pass our screening, gel in and out, and wear a mask at all times, including in the patient's room.  o Patients must also wear a mask when staff or their support person are in the room.  o Masking is required regardless of vaccination status.  Systemwide, no visitors 17 or younger.

## 2020-06-14 ENCOUNTER — Other Ambulatory Visit: Payer: Medicare Other

## 2020-06-14 ENCOUNTER — Other Ambulatory Visit: Payer: Self-pay

## 2020-06-14 ENCOUNTER — Encounter
Admission: RE | Admit: 2020-06-14 | Discharge: 2020-06-14 | Disposition: A | Discharge status: Home / Self Care | Payer: Medicare Other | Source: Ambulatory Visit | Attending: General Surgery | Admitting: General Surgery

## 2020-06-14 DIAGNOSIS — Z20822 Contact with and (suspected) exposure to covid-19: Secondary | ICD-10-CM | POA: Diagnosis not present

## 2020-06-14 DIAGNOSIS — R54 Age-related physical debility: Secondary | ICD-10-CM | POA: Diagnosis not present

## 2020-06-14 DIAGNOSIS — Z01818 Encounter for other preprocedural examination: Secondary | ICD-10-CM | POA: Diagnosis not present

## 2020-06-14 LAB — BASIC METABOLIC PANEL
Anion gap: 7 (ref 5–15)
BUN: 19 mg/dL (ref 8–23)
CO2: 29 mmol/L (ref 22–32)
Calcium: 8.7 mg/dL — ABNORMAL LOW (ref 8.9–10.3)
Chloride: 102 mmol/L (ref 98–111)
Creatinine, Ser: 1.25 mg/dL — ABNORMAL HIGH (ref 0.44–1.00)
GFR, Estimated: 44 mL/min — ABNORMAL LOW (ref >60)
Glucose, Bld: 95 mg/dL (ref 70–99)
Potassium: 3.9 mmol/L (ref 3.5–5.1)
Sodium: 138 mmol/L (ref 135–145)

## 2020-06-14 LAB — SARS CORONAVIRUS 2 (TAT 6-24 HRS): SARS Coronavirus 2: NEGATIVE

## 2020-06-18 ENCOUNTER — Ambulatory Visit: Payer: Medicare Other | Admitting: Urgent Care

## 2020-06-18 ENCOUNTER — Other Ambulatory Visit: Payer: Self-pay

## 2020-06-18 ENCOUNTER — Encounter: Payer: Self-pay | Admitting: General Surgery

## 2020-06-18 ENCOUNTER — Encounter
Admission: RE | Disposition: A | Discharge status: Home / Self Care | Payer: Self-pay | Source: Home / Self Care | Attending: General Surgery

## 2020-06-18 ENCOUNTER — Observation Stay
Admission: RE | Admit: 2020-06-18 | Discharge: 2020-06-19 | Disposition: A | Discharge status: Home / Self Care | Payer: Medicare Other | Attending: General Surgery | Admitting: General Surgery

## 2020-06-18 DIAGNOSIS — Z79899 Other long term (current) drug therapy: Secondary | ICD-10-CM | POA: Insufficient documentation

## 2020-06-18 DIAGNOSIS — E042 Nontoxic multinodular goiter: Secondary | ICD-10-CM | POA: Diagnosis not present

## 2020-06-18 DIAGNOSIS — E890 Postprocedural hypothyroidism: Secondary | ICD-10-CM

## 2020-06-18 DIAGNOSIS — K219 Gastro-esophageal reflux disease without esophagitis: Secondary | ICD-10-CM | POA: Diagnosis not present

## 2020-06-18 DIAGNOSIS — Z7982 Long term (current) use of aspirin: Secondary | ICD-10-CM | POA: Diagnosis not present

## 2020-06-18 DIAGNOSIS — E785 Hyperlipidemia, unspecified: Secondary | ICD-10-CM | POA: Diagnosis not present

## 2020-06-18 DIAGNOSIS — E039 Hypothyroidism, unspecified: Secondary | ICD-10-CM | POA: Insufficient documentation

## 2020-06-18 DIAGNOSIS — E049 Nontoxic goiter, unspecified: Principal | ICD-10-CM | POA: Insufficient documentation

## 2020-06-18 HISTORY — PX: THYROIDECTOMY: SHX17

## 2020-06-18 LAB — CALCIUM: Calcium: 8.2 mg/dL — ABNORMAL LOW (ref 8.9–10.3)

## 2020-06-18 LAB — ALBUMIN: Albumin: 3.5 g/dL (ref 3.5–5.0)

## 2020-06-18 SURGERY — THYROIDECTOMY
Anesthesia: General | Site: Neck

## 2020-06-18 MED ORDER — OXYCODONE-ACETAMINOPHEN 5-325 MG PO TABS
ORAL_TABLET | ORAL | Status: AC
  Filled 2020-06-18: qty 1

## 2020-06-18 MED ORDER — ONDANSETRON HCL 4 MG/2ML IJ SOLN
INTRAMUSCULAR | Status: DC | PRN
  Administered 2020-06-18: 4 mg via INTRAVENOUS

## 2020-06-18 MED ORDER — OXYCODONE HCL 5 MG PO TABS
ORAL_TABLET | ORAL | Status: AC
  Filled 2020-06-18: qty 2

## 2020-06-18 MED ORDER — ACETAMINOPHEN 500 MG PO TABS
1000.0000 mg | ORAL_TABLET | Freq: Four times a day (QID) | ORAL | Status: DC
  Administered 2020-06-18: 1000 mg via ORAL

## 2020-06-18 MED ORDER — MENTHOL 3 MG MT LOZG
1.0000 | LOZENGE | OROMUCOSAL | Status: DC | PRN
  Filled 2020-06-18: qty 9

## 2020-06-18 MED ORDER — PHENYLEPHRINE HCL (PRESSORS) 10 MG/ML IV SOLN
INTRAVENOUS | Status: DC | PRN
  Administered 2020-06-18: 100 ug via INTRAVENOUS
  Administered 2020-06-18 (×3): 200 ug via INTRAVENOUS
  Administered 2020-06-18: 100 ug via INTRAVENOUS

## 2020-06-18 MED ORDER — FAMOTIDINE 20 MG PO TABS
ORAL_TABLET | ORAL | Status: AC
  Filled 2020-06-18: qty 1

## 2020-06-18 MED ORDER — ONDANSETRON HCL 4 MG/2ML IJ SOLN
INTRAMUSCULAR | Status: AC
  Filled 2020-06-18: qty 2

## 2020-06-18 MED ORDER — LACTATED RINGERS IV SOLN: INTRAVENOUS | Status: DC

## 2020-06-18 MED ORDER — DEXAMETHASONE SODIUM PHOSPHATE 10 MG/ML IJ SOLN
INTRAMUSCULAR | Status: AC
  Filled 2020-06-18: qty 1

## 2020-06-18 MED ORDER — CHLORHEXIDINE GLUCONATE 0.12 % MT SOLN
OROMUCOSAL | Status: AC
  Administered 2020-06-18: 15 mL via OROMUCOSAL
  Filled 2020-06-18: qty 15

## 2020-06-18 MED ORDER — LEVOTHYROXINE SODIUM 137 MCG PO TABS
137.0000 ug | ORAL_TABLET | Freq: Every day | ORAL | Status: DC
  Administered 2020-06-19: 137 ug via ORAL
  Filled 2020-06-18: qty 1

## 2020-06-18 MED ORDER — OXYCODONE HCL 5 MG PO TABS
ORAL_TABLET | ORAL | Status: AC
  Administered 2020-06-18: 5 mg via ORAL
  Filled 2020-06-18: qty 1

## 2020-06-18 MED ORDER — ACETAMINOPHEN 500 MG PO TABS
ORAL_TABLET | ORAL | Status: AC
  Administered 2020-06-18: 1000 mg via ORAL
  Filled 2020-06-18: qty 2

## 2020-06-18 MED ORDER — CHLORHEXIDINE GLUCONATE CLOTH 2 % EX PADS
6.0000 | MEDICATED_PAD | Freq: Once | CUTANEOUS | Status: AC
  Administered 2020-06-18: 6 via TOPICAL

## 2020-06-18 MED ORDER — PROPOFOL 10 MG/ML IV BOLUS
INTRAVENOUS | Status: DC | PRN
  Administered 2020-06-18: 30 mg via INTRAVENOUS
  Administered 2020-06-18: 140 mg via INTRAVENOUS

## 2020-06-18 MED ORDER — CALCIUM CARBONATE 1250 (500 CA) MG PO TABS
1000.0000 mg | ORAL_TABLET | Freq: Three times a day (TID) | ORAL | Status: DC
  Administered 2020-06-18 – 2020-06-19 (×2): 1000 mg via ORAL
  Filled 2020-06-18 (×4): qty 2

## 2020-06-18 MED ORDER — ORAL CARE MOUTH RINSE: 15.0000 mL | Freq: Once | OROMUCOSAL | Status: AC

## 2020-06-18 MED ORDER — ONDANSETRON HCL 4 MG/2ML IJ SOLN: 4.0000 mg | Freq: Four times a day (QID) | INTRAMUSCULAR | Status: DC | PRN

## 2020-06-18 MED ORDER — TRAZODONE HCL 50 MG PO TABS
25.0000 mg | ORAL_TABLET | Freq: Every evening | ORAL | Status: DC | PRN
  Filled 2020-06-18: qty 1

## 2020-06-18 MED ORDER — HEMOSTATIC AGENTS (NO CHARGE) OPTIME
TOPICAL | Status: DC | PRN
  Administered 2020-06-18 (×2): 1 via TOPICAL

## 2020-06-18 MED ORDER — FENTANYL CITRATE (PF) 100 MCG/2ML IJ SOLN
INTRAMUSCULAR | Status: DC | PRN
  Administered 2020-06-18 (×4): 50 ug via INTRAVENOUS

## 2020-06-18 MED ORDER — METHOCARBAMOL 500 MG PO TABS: 500.0000 mg | ORAL_TABLET | Freq: Four times a day (QID) | ORAL | Status: DC | PRN

## 2020-06-18 MED ORDER — DEXAMETHASONE SODIUM PHOSPHATE 10 MG/ML IJ SOLN
INTRAMUSCULAR | Status: DC | PRN
  Administered 2020-06-18: 10 mg via INTRAVENOUS

## 2020-06-18 MED ORDER — APREPITANT 40 MG PO CAPS
40.0000 mg | ORAL_CAPSULE | Freq: Every day | ORAL | Status: DC
  Administered 2020-06-18: 40 mg via ORAL

## 2020-06-18 MED ORDER — EPHEDRINE SULFATE 50 MG/ML IJ SOLN
INTRAMUSCULAR | Status: DC | PRN
  Administered 2020-06-18: 5 mg via INTRAVENOUS
  Administered 2020-06-18 (×2): 10 mg via INTRAVENOUS

## 2020-06-18 MED ORDER — DULOXETINE HCL 60 MG PO CPEP
60.0000 mg | ORAL_CAPSULE | ORAL | Status: DC
  Administered 2020-06-19: 60 mg via ORAL
  Filled 2020-06-18: qty 1

## 2020-06-18 MED ORDER — SIMETHICONE 80 MG PO CHEW
40.0000 mg | CHEWABLE_TABLET | Freq: Four times a day (QID) | ORAL | Status: DC | PRN
  Filled 2020-06-18: qty 1

## 2020-06-18 MED ORDER — PROPOFOL 10 MG/ML IV BOLUS
INTRAVENOUS | Status: AC
  Filled 2020-06-18: qty 20

## 2020-06-18 MED ORDER — SUCCINYLCHOLINE CHLORIDE 20 MG/ML IJ SOLN
INTRAMUSCULAR | Status: DC | PRN
  Administered 2020-06-18: 100 mg via INTRAVENOUS

## 2020-06-18 MED ORDER — LABETALOL HCL 5 MG/ML IV SOLN
INTRAVENOUS | Status: DC | PRN
  Administered 2020-06-18: 5 mg via INTRAVENOUS

## 2020-06-18 MED ORDER — ATORVASTATIN CALCIUM 20 MG PO TABS
40.0000 mg | ORAL_TABLET | Freq: Every day | ORAL | Status: DC
  Administered 2020-06-18: 40 mg via ORAL
  Filled 2020-06-18 (×2): qty 2

## 2020-06-18 MED ORDER — SUCCINYLCHOLINE CHLORIDE 200 MG/10ML IV SOSY
PREFILLED_SYRINGE | INTRAVENOUS | Status: AC
  Filled 2020-06-18: qty 10

## 2020-06-18 MED ORDER — FENTANYL CITRATE (PF) 100 MCG/2ML IJ SOLN
INTRAMUSCULAR | Status: AC
  Filled 2020-06-18: qty 2

## 2020-06-18 MED ORDER — PROPOFOL 500 MG/50ML IV EMUL
INTRAVENOUS | Status: DC | PRN
  Administered 2020-06-18: 25 ug/kg/min via INTRAVENOUS

## 2020-06-18 MED ORDER — CHLORHEXIDINE GLUCONATE 0.12 % MT SOLN: 15.0000 mL | Freq: Once | OROMUCOSAL | Status: AC

## 2020-06-18 MED ORDER — LIDOCAINE HCL (CARDIAC) PF 100 MG/5ML IV SOSY
PREFILLED_SYRINGE | INTRAVENOUS | Status: DC | PRN
  Administered 2020-06-18: 100 mg via INTRAVENOUS

## 2020-06-18 MED ORDER — VITAMIN D3 25 MCG (1000 UNIT) PO TABS
2000.0000 [IU] | ORAL_TABLET | Freq: Every day | ORAL | Status: DC
  Administered 2020-06-18 – 2020-06-19 (×2): 2000 [IU] via ORAL
  Filled 2020-06-18 (×2): qty 2

## 2020-06-18 MED ORDER — ROCURONIUM BROMIDE 100 MG/10ML IV SOLN
INTRAVENOUS | Status: DC | PRN
  Administered 2020-06-18: 10 mg via INTRAVENOUS

## 2020-06-18 MED ORDER — APREPITANT 40 MG PO CAPS
ORAL_CAPSULE | ORAL | Status: AC
  Filled 2020-06-18: qty 1

## 2020-06-18 MED ORDER — MIDAZOLAM HCL 2 MG/2ML IJ SOLN
INTRAMUSCULAR | Status: DC | PRN
  Administered 2020-06-18: 2 mg via INTRAVENOUS

## 2020-06-18 MED ORDER — DEXTROSE IN LACTATED RINGERS 5 % IV SOLN: INTRAVENOUS | Status: DC

## 2020-06-18 MED ORDER — OXYCODONE HCL 5 MG PO TABS
5.0000 mg | ORAL_TABLET | ORAL | Status: DC | PRN
  Administered 2020-06-18: 5 mg via ORAL
  Administered 2020-06-18: 10 mg via ORAL
  Administered 2020-06-19: 5 mg via ORAL

## 2020-06-18 MED ORDER — TRAMADOL HCL 50 MG PO TABS: 50.0000 mg | ORAL_TABLET | Freq: Four times a day (QID) | ORAL | Status: DC | PRN

## 2020-06-18 MED ORDER — ONDANSETRON 4 MG PO TBDP: 4.0000 mg | ORAL_TABLET | Freq: Four times a day (QID) | ORAL | Status: DC | PRN

## 2020-06-18 MED ORDER — ACETAMINOPHEN 500 MG PO TABS: 1000.0000 mg | ORAL_TABLET | ORAL | Status: AC

## 2020-06-18 MED ORDER — IBUPROFEN 600 MG PO TABS: 600.0000 mg | ORAL_TABLET | Freq: Four times a day (QID) | ORAL | Status: DC | PRN

## 2020-06-18 MED ORDER — MIDAZOLAM HCL 2 MG/2ML IJ SOLN
INTRAMUSCULAR | Status: AC
  Filled 2020-06-18: qty 2

## 2020-06-18 MED ORDER — LIDOCAINE HCL (PF) 2 % IJ SOLN
INTRAMUSCULAR | Status: AC
  Filled 2020-06-18: qty 5

## 2020-06-18 MED ORDER — ESTRADIOL 1 MG PO TABS
0.5000 mg | ORAL_TABLET | Freq: Every day | ORAL | Status: DC
  Administered 2020-06-18 – 2020-06-19 (×2): 0.5 mg via ORAL
  Filled 2020-06-18 (×2): qty 0.5

## 2020-06-18 SURGICAL SUPPLY — 49 items
ADH SKN CLS APL DERMABOND .7 (GAUZE/BANDAGES/DRESSINGS) ×1
BACTOSHIELD CHG 4% 4OZ (MISCELLANEOUS) ×1
BASIN GRAD PLASTIC 32OZ STRL (MISCELLANEOUS) ×2 IMPLANT
BLADE SURG 15 STRL LF DISP TIS (BLADE) ×1 IMPLANT
BLADE SURG 15 STRL SS (BLADE) ×2
CANISTER SUCT 1200ML W/VALVE (MISCELLANEOUS) IMPLANT
CLIP VESOCCLUDE SM WIDE 6/CT (CLIP) ×2 IMPLANT
COVER WAND RF STERILE (DRAPES) ×4 IMPLANT
DERMABOND ADVANCED (GAUZE/BANDAGES/DRESSINGS) ×1
DERMABOND ADVANCED .7 DNX12 (GAUZE/BANDAGES/DRESSINGS) ×1 IMPLANT
DRAPE MAG INST 16X20 L/F (DRAPES) ×2 IMPLANT
DRAPE THYROID T SHEET (DRAPES) ×2 IMPLANT
ELECT CAUTERY BLADE TIP 2.5 (TIP) ×2
ELECT LARYNGEAL DUAL CHAN (ELECTRODE) ×2 IMPLANT
ELECT NEEDLE 20X.3 GREEN (MISCELLANEOUS) ×2
ELECT REM PT RETURN 9FT ADLT (ELECTROSURGICAL) ×2
ELECTRODE CAUTERY BLDE TIP 2.5 (TIP) ×1 IMPLANT
ELECTRODE NDL 20X.3 GREEN (MISCELLANEOUS) IMPLANT
ELECTRODE NEEDLE 20X.3 GREEN (MISCELLANEOUS) ×1 IMPLANT
ELECTRODE REM PT RTRN 9FT ADLT (ELECTROSURGICAL) ×1 IMPLANT
GAUZE 4X4 16PLY RFD (DISPOSABLE) ×3 IMPLANT
GLOVE BIO SURGEON STRL SZ 6.5 (GLOVE) ×8 IMPLANT
GLOVE INDICATOR 7.0 STRL GRN (GLOVE) ×4 IMPLANT
GOWN STRL REUS W/ TWL LRG LVL3 (GOWN DISPOSABLE) ×2 IMPLANT
GOWN STRL REUS W/TWL LRG LVL3 (GOWN DISPOSABLE) ×8
HEMOSTAT SNOW SURGICEL 2X4 (HEMOSTASIS) ×2 IMPLANT
KIT TURNOVER KIT A (KITS) ×2 IMPLANT
LABEL OR SOLS (LABEL) ×2 IMPLANT
MANIFOLD NEPTUNE II (INSTRUMENTS) ×1 IMPLANT
NERVE STIMULATOR WAVEFORM 16S (NEUROSURGERY SUPPLIES)
NS IRRIG 1000ML POUR BTL (IV SOLUTION) ×1 IMPLANT
NS IRRIG 500ML POUR BTL (IV SOLUTION) ×2 IMPLANT
PACK BASIN MINOR (MISCELLANEOUS) ×2 IMPLANT
PROBE NEUROSIGN BIPOL (MISCELLANEOUS) IMPLANT
PROBE NEUROSIGN BIPOLAR (MISCELLANEOUS) ×2
SCRUB CHG 4% DYNA-HEX 4OZ (MISCELLANEOUS) ×1 IMPLANT
SET WALTER ACTIVATION W/DRAPE (SET/KITS/TRAYS/PACK) ×1 IMPLANT
SHEARS HARMONIC 9CM CVD (BLADE) ×2 IMPLANT
SPONGE KITTNER 5P (MISCELLANEOUS) ×2 IMPLANT
STIMULATOR NERVE WAVEFORM 16S (NEUROSURGERY SUPPLIES) IMPLANT
STRIP CLOSURE SKIN 1/2X4 (GAUZE/BANDAGES/DRESSINGS) ×2 IMPLANT
SUT MNCRL AB 4-0 PS2 18 (SUTURE) ×2 IMPLANT
SUT PROLENE 4 0 PS 2 18 (SUTURE) ×2 IMPLANT
SUT SILK 2 0 (SUTURE) ×10
SUT SILK 2-0 18XBRD TIE 12 (SUTURE) ×1 IMPLANT
SUT VIC AB 4-0 RB1 27 (SUTURE) ×2
SUT VIC AB 4-0 RB1 27X BRD (SUTURE) ×1 IMPLANT
SYR BULB IRRIG 60ML STRL (SYRINGE) ×2 IMPLANT
TOWEL OR 17X26 4PK STRL BLUE (TOWEL DISPOSABLE) ×1 IMPLANT

## 2020-06-18 NOTE — Transfer of Care (Signed)
Immediate Anesthesia Transfer of Care Note  Patient: Lauren Johnston  Procedure(s) Performed: THYROIDECTOMY, total (N/A Neck)  Patient Location: PACU  Anesthesia Type:General  Level of Consciousness: sedated  Airway & Oxygen Therapy: Patient Spontanous Breathing and Patient connected to face mask oxygen  Post-op Assessment: Report given to RN and Post -op Vital signs reviewed and stable  Post vital signs: Reviewed and stable  Last Vitals:  Vitals Value Taken Time  BP 130/81 06/18/20 1051  Temp    Pulse 80 06/18/20 1052  Resp 13 06/18/20 1052  SpO2 100 % 06/18/20 1052  Vitals shown include unvalidated device data.  Last Pain:  Vitals:   06/18/20 0637  TempSrc: Tympanic  PainSc: 5       Patients Stated Pain Goal: 0 (83/41/96 2229)  Complications: No complications documented.

## 2020-06-18 NOTE — Op Note (Signed)
Operative Note  Preoperative Diagnosis:  a substernal goiter  Postoperative Diagnosis:  a substernal goiter  Operation:  Total Thyroidectomy with Cervical Extraction of Substernal Component  Surgeon: Fredirick Maudlin, MD  Assistant: Nestor Lewandowsky, MD (a second surgeon was necessary due to the technical complexity of the case)  Anesthesia: General endotracheal with nerve monitoring system  Findings: The right lobe of the thyroid was more or less ablated from the previous radioactive iodine treatment, leaving a small residual nodule.  The left lobe was markedly enlarged with substantial cystic degeneration.  It extended beneath the clavicles into the superior mediastinum.  Both recurrent laryngeal nerves were clearly identified and gave good functional signals throughout the operation.  We did not visualize parathyroid tissue on the left side in either the superior or inferior position.  Both parathyroid glands were clearly identified on the right and preserved on good vascular pedicles.  No parathyroid tissue was seen on the surgical specimen.  Indications: This is a 69 year old woman who has a prior history of Graves' disease, treated with radioactive iodine ablation.  Over time, the residual thyroid tissue has enlarged and extended beneath the clavicles on the left.  She had a chronic cough and was evaluated by pulmonology who performed a chest CT.  Their opinion was that her chronic cough was secondary to tracheal irritation from the goiter.  She was referred for surgical evaluation.  Total thyroidectomy was recommended.  The risks of the operation were discussed with her and she agreed to proceed.  Procedure In Detail: The patient was identified in the preoperative holding area and brought to the operating room where she was placed supine on the OR table.  Bony prominences were padded and bilateral sequential compression devices were placed on the lower extremities.  General endotracheal  anesthesia was induced using the nerve monitoring system.  Tube placement was verified with the McGrath laryngoscope.  The grounding lead was placed.  The patient was then positioned appropriately for the operation.  She was sterilely prepped and draped in standard fashion.  A timeout was performed confirming the patient's identity, the procedure being performed, her allergies, all necessary equipment was available, and that maintenance anesthesia was adequate.  A 6 cm transverse incision was made in a natural skin fold that was appropriately positioned for the operation.  This was carried down through the subcutaneous tissues and platysma using electrocautery.  Subplatysmal flaps were elevated and the strap muscles were divided in the median raphae.  We elevated the strap muscles off of the left lobe of the thyroid and retracted them laterally.  We then sequentially isolated and divided the superior pole vessels with silk ties and the harmonic scalpel.  The middle thyroid vein was similarly divided.  As we carefully mobilized the lobe of the thyroid we proceeded to dissect towards the level of the clavicles.  The finger sweep beneath the clavicles and sternum elevated the thyroid tissue out and up into our wound.  The gland was quite friable and fractured at this point, demonstrating multiple cystic degenerating hyperplastic nodules.  We opened the tracheoesophageal groove and exposed the recurrent laryngeal nerve.  It gave an excellent signal when stimulated.  It was carefully teased away from the thyroid tissue and preserved without manipulating the nerve itself.  The inferior pole vessels were then divided with silk ties and the harmonic scalpel.  She had a very ample and robust fatty thymus that was ligated at its connection points to the thyroid gland.  The nerve  was dissected up to its insertion point at the cricopharyngeal muscle.  The ligament of Berry and anterior suspensory ligament were divided.  The  gland was dissected off of the trachea and across the midline.  We turned to the right where we proceeded similarly.  This side had a significant degree of post ablative adhesiveness, but the lobe was markedly reduced in size.  The strap muscles were elevated off the lobe and retracted laterally.  The superior pole vessels were sequentially isolated and divided with silk ties and the harmonic scalpel.  The middle thyroid vein was similarly divided.  We dissected into the lateral fibrofatty tissues of the central neck and identified the recurrent laryngeal nerve.  It gave an excellent signal when stimulated.  The trachea was exposed medial to the nerve and the inferior pole vessels were divided.  The inferior parathyroid gland was visualized and preserved on a good pedicle.  The nerve was dissected up to its insertion point at the cricopharyngeal muscle.  The superior parathyroid gland was visualized and preserved in situ.  Remaining attachments of the thyroid to the trachea were divided and the gland was completely excised.  It was carefully inspected for any parathyroid tissue and none was seen.  It was handed off as a specimen.  The wound beds were irrigated and hemostasis was achieved.  Valsalva maneuvers from the anesthesia service confirmed no ongoing surgical bleeding.  We applied Hemoblast and SNoW for additional hemostatic effects.  The strap muscles were closed in midline with running 4-0 Vicryl.  The platysma was closed with interrupted 4-0 Vicryl.  The skin was closed with running subcuticular Monocryl.  The skin was cleaned.  Dermabond and Steri-Strips were applied.  The Monocryl suture was trimmed at the skin surface.  The patient was awakened, extubated, and taken to the postanesthesia care unit in good condition.  EBL: 150 cc  IVF: See anesthesia record  Specimen(s): Total thyroid to pathology for permanent section  Complications: none immediately apparent.   Counts: all needles,  instruments, and sponges were counted and reported to be correct in number at the end of the case.   I was present for and participated in the entire operation.  Fredirick Maudlin 10:58 AM

## 2020-06-18 NOTE — Anesthesia Postprocedure Evaluation (Signed)
Anesthesia Post Note  Patient: Lauren Johnston  Procedure(s) Performed: THYROIDECTOMY, total (N/A Neck)  Patient location during evaluation: PACU Anesthesia Type: General Level of consciousness: awake and alert Pain management: pain level controlled Vital Signs Assessment: post-procedure vital signs reviewed and stable Respiratory status: spontaneous breathing and respiratory function stable Cardiovascular status: stable Anesthetic complications: no   No complications documented.   Last Vitals:  Vitals:   06/18/20 1051 06/18/20 1106  BP: 130/81 140/82  Pulse: 81 84  Resp: 12 15  Temp: (!) 35.9 C   SpO2: 100% 100%    Last Pain:  Vitals:   06/18/20 1121  TempSrc:   PainSc: 0-No pain                 Grantland Want K

## 2020-06-18 NOTE — H&P (Signed)
Chief Complaint  Patient presents with  . Follow-up     Ref from Dr Genevive Bi, current pt, enlarged Thyroid, chest mass    HPI Lauren Johnston is a 69 y.o. female.   She is here today for further evaluation and management of a large, partially substernal goiter.  She has a past medical history significant for Graves' disease, treated with radioactive iodine.  She has been followed by Dr. Honor Junes at the [facility] for post ablation thyroid hormone management.  She currently takes 50 mcg of levothyroxine daily.  Over the past 2 years or so, she has developed a chronic cough, most predominant at night while she is supine.  This is been evaluated by a number of providers.  She recently saw Dr. Patsey Berthold at [facility] pulmonology.  Pulmonary function testing was performed, along with a CT scan of the chest.  This demonstrated rightward tracheal deviation and a upper mediastinal mass consistent with a goiter.  The impression from pulmonology was that her cough was most likely secondary to tracheal irritation from the goiter.  She was referred to see my partner, Dr. Genevive Bi, for further evaluation and potential surgical intervention.  He subsequently referred her to see me.  She is here today for further discussion of operative intervention.  Since treatment for her Berenice Primas' disease, she denies any heart palpitations or hand tremors.  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  No change in the texture of her skin or fingernails.  She denies heat or cold intolerance.  Her weight has been stable.  Her primary complaint is coughing while supine.  She does not endorse any dyspnea in the supine position.  No voice changes or dysphagia.  No known family history of thyroid malignancy.       Past Medical History:  Diagnosis Date  . Arthritis    hips  . Dental crowns present    dental implants - upper  . Depression   . GERD (gastroesophageal reflux disease)   . Hyperlipidemia   .  Hypothyroidism   . Thyroid disease          Past Surgical History:  Procedure Laterality Date  . ABDOMINAL HYSTERECTOMY    . BILATERAL OOPHORECTOMY    . COLONOSCOPY WITH PROPOFOL N/A 06/02/2016   Procedure: COLONOSCOPY WITH PROPOFOL;  Surgeon: Lucilla Lame, MD;  Location: [place];  Service: Endoscopy;  Laterality: N/A;  . dequavian tendonitis    . KNEE ARTHROSCOPY    . POLYPECTOMY  06/02/2016   Procedure: POLYPECTOMY;  Surgeon: Lucilla Lame, MD;  Location: [place];  Service: Endoscopy;;         Family History  Problem Relation Age of Onset  . Diabetes Mother   . Heart disease Mother   . Emphysema Father   . Heart disease Father   . COPD Father   . Multiple myeloma Brother   . Alzheimer's disease Sister   . Dementia Sister     Social History Social History        Tobacco Use  . Smoking status: Never Smoker  . Smokeless tobacco: Never Used  Vaping Use  . Vaping Use: Never used  Substance Use Topics  . Alcohol use: No    Alcohol/week: 0.0 standard drinks  . Drug use: No         Allergies  Allergen Reactions  . Sulfamethoxazole-Trimethoprim Other (See Comments) and Rash  . Citalopram Itching  . Tetracycline Other (See Comments)    Other Reaction: URTICARIA  .  Tetracyclines & Related Itching  . Codeine Itching          Current Outpatient Medications  Medication Sig Dispense Refill  . aspirin 81 MG tablet Take 81 mg by mouth daily.    Marland Kitchen atorvastatin (LIPITOR) 20 MG tablet Take 1 tablet (20 mg total) by mouth daily. 90 tablet 1  . Biotin w/ Vitamins C & E (HAIR SKIN & NAILS GUMMIES PO) Take by mouth. Taking every other day    . Calcium Carb-Cholecalciferol (CALCIUM 1000 + D PO) Take by mouth. Taking every other day    . DULoxetine (CYMBALTA) 60 MG capsule Take 1 capsule (60 mg total) by mouth daily. 90 capsule 1  . estradiol (ESTRACE) 0.5 MG tablet TAKE 1 TABLET BY MOUTH EVERY DAY 90 tablet 0  .  levothyroxine (SYNTHROID, LEVOTHROID) 50 MCG tablet Take 1 tablet (50 mcg total) by mouth daily before breakfast. 90 tablet 0  . omeprazole (PRILOSEC) 20 MG capsule Take 1 capsule (20 mg total) by mouth daily. 30 capsule 11  . Tiotropium Bromide Monohydrate (SPIRIVA RESPIMAT) 1.25 MCG/ACT AERS Inhale 2 puffs into the lungs daily. 4 g 0  . traZODone (DESYREL) 50 MG tablet TAKE 0.5-1 TABLETS (25-50 MG TOTAL) BY MOUTH AT BEDTIME AS NEEDED FOR SLEEP. 90 tablet 0   No current facility-administered medications for this visit.    Review of Systems Review of Systems  Respiratory: Positive for cough.   All other systems reviewed and are negative.  Today's Vitals   06/18/20 0637  BP: 115/74  Pulse: 68  Resp: 16  Temp: 97.7 F (36.5 C)  TempSrc: Tympanic  SpO2: 100%  Weight: 91.6 kg  Height: _0  (1.676 m)  PainSc: 5    Body mass index is 32.6 kg/m.   Physical Exam Physical Exam Constitutional:      General: She is not in acute distress.    Appearance: Normal appearance. She is obese.  HENT:     Head: Normocephalic and atraumatic.     Nose:     Comments: Covered with a mask    Mouth/Throat:     Comments: Covered with a mask Eyes:     General: No scleral icterus.       Right eye: No discharge.        Left eye: No discharge.     Comments: No proptosis or exophthalmos.  No lid lag or stare.  No periorbital edema.  Neck:     Comments: No palpable cervical or supraclavicular lymphadenopathy.  The cervical trachea feels more or less midline.  The thyroid gland moves freely with deglutition.  When I position the patient supine with her neck extended, I am able to palpate the inferior border of the goiter, just at the sternal notch. Cardiovascular:     Rate and Rhythm: Normal rate and regular rhythm.     Pulses: Normal pulses.  Pulmonary:     Effort: Pulmonary effort is normal.     Breath sounds: Normal breath sounds.  Abdominal:     General: Bowel sounds are normal.      Palpations: Abdomen is soft.  Genitourinary:    Comments: Deferred Musculoskeletal:        General: No swelling or tenderness.     Comments: No pretibial dermopathy  Skin:    General: Skin is warm and dry.  Neurological:     General: No focal deficit present.     Mental Status: She is oriented to person, place, and time.  Psychiatric:  Mood and Affect: Mood normal.        Behavior: Behavior normal.     Data Reviewed I reviewed the pulmonary function test performed on March 15, 2020, with the results having been discussed with the patient by Dr. Patsey Berthold.  Results for SAPPHIRA, HARJO (MRN [id number]) as of 04/20/2020 11:24  Ref. Range 03/06/2016 10:49 07/09/2016 12:33 05/12/2017 11:14 12/16/2017 10:26 10/24/2019 08:37  TSH Latest Ref Range: 0.40 - 4.50 mIU/L 1.960 0.77 2.01 1.24 2.98  These results are normal and suggest adequate thyroid hormone replacement.  I reviewed the results of a thyroid ultrasound performed February 23, 2019.  The actual images are not available for my review.  Indication: Multinodular goiter   Date: 02/23/2019  Comparison: 05/20/2018 and 02/18/2017   Technique: Gray-scale and color Doppler images of the neck were obtained.   Findings:  THYROID:  The right lobe of the thyroid measures 2.4 x 1.3 x 1.3 cm.  The left lobe of the thyroid measures 5.9 x 3.1 x 4.3 cm.  The isthmus measures 0.3 cm in AP depth.   Within the right lobe, a well-circumscribed mid pole hypoechoic nodule is  seen measuring 1.2 x 1.3 x 1.2 cm (1.4x1.3x1.1 cm in 9/19 and 1.4x1.3x1.6  in 6/18)    Within the left lobe, a dominant mid/lower pole nodule is seen measuring  4.4 x 3.4 x 4.4 cm (4.2x3.3x4.1 cm in 9/19 and 5.5x2.9x1.8 cm in 6/18).   An upper pole nodule was also noted measuring 0.9 x 1.6 x 0.7 cm  (1.1x1.6x1.8 cm in 9/19)    There are no significantly enlarged lymph nodes in the neck.    Impression:  Multinodular goiter as described above. No significant change  has  occurred since ultrasound in 9/19 and 6/18. Recommend continued  surveillance.   I reviewed Dr. Sherren Mocha clinic note from February 22, 2020.  This describes the fact that the patient has had prior biopsies of the nodules with in the bilateral thyroid lobes, both of which have been benign.  There is also a telephone note from Dr. Honor Junes dated March 29, 2020.  He communicated with Dr. Patsey Berthold regarding the findings on the CT scan.  According to Ms. Bloodsworth, this communication resulted in Dr. Sherren Mocha agreement that she should pursue surgery.  I also personally reviewed the images associated with the CT scan of the chest performed March 01, 2020.  I concur with the radiologist impression of a substernal thyroid mass extending from the left lobe.  Assessment This is a 69 year old woman with a prior history of Graves' disease, status post radioactive iodine ablation.  Despite this treatment, she has had progressive growth of a multinodular goiter.  She also has had a chronic cough with phlegm production, worse while in the supine position.  Further evaluation of this concern led to pulmonology evaluation.  CT scan of the chest demonstrated partial substernal extension of the left lobe of the thyroid along with some tracheal deviation.  There is no tracheal compression and I do not have any concern for tracheomalacia.  When the patient is in the supine position with her neck extended, the substernal component of the goiter elevates to the level of the sternal notch.  I have recommended that she undergo total thyroidectomy.  Plan The risks of thyroid surgery were discussed, including (but not limited to): bleeding, infection, damage to surrounding structures/tissues, injury (temporary or permanent) to the recurrent laryngeal nerve, hypoparathyroidism (temporary or permanent), need for thyroid hormone replacement therapy, need for  additional surgery and/or treatment, recurrence of disease,  tracheostomy (temporary or permanent).  The patient had the opportunity to ask any questions and these were answered to their satisfaction.  I specifically discussed this patient with Dr. Vashti Hey, of the anesthesiology division.  I reviewed her films as well as discussed with him the physical exam findings.  He is in agreement that we can proceed with operative intervention at [facility].   There have been no significant changes to her medical or surgical history.  We will proceed with total thyroidectomy today.

## 2020-06-18 NOTE — Anesthesia Preprocedure Evaluation (Signed)
Anesthesia Evaluation  Patient identified by MRN, date of birth, ID band Patient awake    Reviewed: Allergy & Precautions, NPO status , Patient's Chart, lab work & pertinent test results  History of Anesthesia Complications Negative for: history of anesthetic complications  Airway Mallampati: II       Dental   Pulmonary           Cardiovascular (-) hypertension(-) Past MI and (-) CHF (-) dysrhythmias (-) Valvular Problems/Murmurs     Neuro/Psych neg Seizures Depression    GI/Hepatic Neg liver ROS, GERD  Medicated and Controlled,  Endo/Other  neg diabetesHypothyroidism   Renal/GU Renal InsufficiencyRenal disease     Musculoskeletal   Abdominal   Peds  Hematology   Anesthesia Other Findings   Reproductive/Obstetrics                             Anesthesia Physical Anesthesia Plan  ASA: III  Anesthesia Plan: General   Post-op Pain Management:    Induction: Intravenous  PONV Risk Score and Plan: 3  Airway Management Planned: Oral ETT  Additional Equipment:   Intra-op Plan:   Post-operative Plan:   Informed Consent: I have reviewed the patients History and Physical, chart, labs and discussed the procedure including the risks, benefits and alternatives for the proposed anesthesia with the patient or authorized representative who has indicated his/her understanding and acceptance.       Plan Discussed with:   Anesthesia Plan Comments:         Anesthesia Quick Evaluation

## 2020-06-18 NOTE — Anesthesia Procedure Notes (Signed)
Procedure Name: Intubation Performed by: Philbert Riser, CRNA Pre-anesthesia Checklist: Patient identified, Suction available, Emergency Drugs available, Patient being monitored and Timeout performed Patient Re-evaluated:Patient Re-evaluated prior to induction Oxygen Delivery Method: Circle system utilized and Simple face mask Preoxygenation: Pre-oxygenation with 100% oxygen Induction Type: IV induction Ventilation: Mask ventilation without difficulty Laryngoscope Size: McGraph and 3 Tube size: 7.0 mm Number of attempts: 1 Placement Confirmation: ETT inserted through vocal cords under direct vision and positive ETCO2

## 2020-06-19 ENCOUNTER — Encounter: Payer: Self-pay | Admitting: General Surgery

## 2020-06-19 DIAGNOSIS — E039 Hypothyroidism, unspecified: Secondary | ICD-10-CM | POA: Diagnosis not present

## 2020-06-19 DIAGNOSIS — Z79899 Other long term (current) drug therapy: Secondary | ICD-10-CM | POA: Diagnosis not present

## 2020-06-19 DIAGNOSIS — E049 Nontoxic goiter, unspecified: Secondary | ICD-10-CM | POA: Diagnosis not present

## 2020-06-19 DIAGNOSIS — Z7982 Long term (current) use of aspirin: Secondary | ICD-10-CM | POA: Diagnosis not present

## 2020-06-19 LAB — ALBUMIN: Albumin: 3.6 g/dL (ref 3.5–5.0)

## 2020-06-19 LAB — CALCIUM: Calcium: 9.1 mg/dL (ref 8.9–10.3)

## 2020-06-19 MED ORDER — CALCIUM CARBONATE 1250 (500 CA) MG PO TABS: 2.0000 | ORAL_TABLET | Freq: Three times a day (TID) | ORAL | 0 refills | Status: DC

## 2020-06-19 MED ORDER — ACETAMINOPHEN 500 MG PO TABS
ORAL_TABLET | ORAL | Status: AC
  Filled 2020-06-19: qty 2

## 2020-06-19 MED ORDER — TRAMADOL HCL 50 MG PO TABS
50.0000 mg | ORAL_TABLET | Freq: Four times a day (QID) | ORAL | 0 refills | Status: DC | PRN
Start: 2020-06-19 — End: 2020-07-03

## 2020-06-19 MED ORDER — IBUPROFEN 600 MG PO TABS
600.0000 mg | ORAL_TABLET | Freq: Four times a day (QID) | ORAL | 0 refills | Status: DC | PRN
Start: 2020-06-19 — End: 2020-07-03

## 2020-06-19 MED ORDER — OXYCODONE HCL 5 MG PO TABS
ORAL_TABLET | ORAL | Status: AC
  Filled 2020-06-19: qty 1

## 2020-06-19 MED ORDER — LEVOTHYROXINE SODIUM 137 MCG PO TABS: 137.0000 ug | ORAL_TABLET | Freq: Every day | ORAL | 3 refills | Status: DC

## 2020-06-19 MED ORDER — ACETAMINOPHEN 500 MG PO TABS
ORAL_TABLET | ORAL | Status: AC
  Administered 2020-06-19: 1000 mg via ORAL
  Filled 2020-06-19: qty 2

## 2020-06-19 NOTE — Discharge Instructions (Signed)

## 2020-06-19 NOTE — Discharge Summary (Addendum)
[facility] SURGICAL DISCHARGE SUMMARY  Patient ID: Lauren Johnston MRN: [id number] DOB/AGE: 02-18-1951 69 y.o.  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  Over time, the residual thyroid tissue has enlarged and extended beneath the clavicles on the left.  She had a chronic cough and was evaluated by pulmonology who performed a chest CT.  Their opinion was that her chronic cough was secondary to tracheal irritation from the goiter.  She was referred for surgical evaluation.  Total thyroidectomy was recommended.  The risks of the operation were discussed with her and she agreed to proceed.  Hospital Course: Informed consent was obtained and documented, and patient underwent uneventful Total Thyroidectomy with Cervical Extraction of Substernal Component (Dr Celine Ahr, 06/18/2020).  Post-operatively, patient's calcium remained stable/normal with scheduled oral calcium and advancement of patient's diet and ambulation were well-tolerated. The remainder of patient's hospital course was essentially unremarkable, and discharge planning was initiated accordingly with patient safely able to be discharged home with appropriate discharge instructions, pain control, and outpatient follow-up after all of her questions were answered to her expressed satisfaction.   Discharge Condition: Good   Physical Examination:  Constitutional: Well appearing female, NAD Pulmonary:  Normal effort, no respiratory distress, normal phonation Skin: 6 cm transverse incision to the anterior neck is CDI with steri-strips, no erythema or drainage   Allergies as of 06/19/2020       Reactions   Sulfamethoxazole-trimethoprim Other (See Comments), Rash   Citalopram Itching   Tetracycline Other (See Comments)   Other Reaction: URTICARIA   Tetracyclines & Related Itching   Codeine Itching        Medication List     STOP taking these medications    CALCIUM 1000 + D PO       TAKE these medications    acetaminophen 500 MG tablet Commonly known as: TYLENOL Take 500 mg by mouth every 6 (six) hours as needed.   aspirin 81 MG tablet Take 81 mg by mouth daily.   atorvastatin 40 MG tablet Commonly known as: LIPITOR Take 1 tablet (40 mg total) by mouth daily. What changed: when to take this   calcium carbonate 1250 (500 Ca) MG tablet Commonly known as: OS-CAL - dosed in mg of elemental calcium Take 2 tablets (1,000 mg of elemental calcium total) by mouth 3 (three) times daily with meals. Take 2 tablets of OS-Cal Three times daily for 7 days, after that take 1 tablet three time a day until your follow up appointment   DULoxetine 60 MG capsule Commonly known as: Cymbalta Take 1 capsule (60 mg total) by mouth daily. What changed: when to take this   estradiol 0.5 MG tablet Commonly known as: ESTRACE TAKE 1 TABLET BY MOUTH EVERY DAY   HAIR SKIN & NAILS GUMMIES PO Take 2 tablets by mouth every other day.   ibuprofen 600 MG tablet Commonly known as: ADVIL Take 1 tablet (600 mg total) by mouth every 6 (six) hours as needed (for mild pain not relieved by other medications.).  levothyroxine 137 MCG tablet Commonly known as: SYNTHROID Take 1 tablet (137 mcg total) by mouth daily at 6 (six) AM. Start taking on: June 20, 2020   traMADol 50 MG tablet Commonly known as: ULTRAM Take 1 tablet (50 mg total) by mouth every 6 (six) hours as needed (mild pain).    traZODone 50 MG tablet Commonly known as: DESYREL Take 0.5-1 tablets (25-50 mg total) by mouth at bedtime as needed for sleep.   Vitamin D 50 MCG (2000 UT) Caps Take 2,000 Units by mouth daily.          Follow-up Information     Fredirick Maudlin, MD. Schedule an appointment as soon as possible for a visit in 2 week(s).   Specialty: General Surgery Why: s/p Total Thyroidectomy with Cervical Extraction of Substernal Component Contact information: [address] [place] [address] [phone]                  Time spent on discharge management including discussion of hospital course, clinical condition, outpatient instructions, prescriptions, and follow up with the patient and members of the medical team: >30 minutes  -- Edison Simon , PA-C  [facility]  06/19/2020, 10:31 AM [phone] M-F: 7am - 4pm   I saw and evaluated the patient.  I agree with the above documentation, exam, and plan, which I have edited where appropriate. Fredirick Maudlin  11:00 AM

## 2020-06-19 NOTE — Plan of Care (Signed)
  Problem: Education: Goal: Knowledge of General Education information will improve Description Including pain rating scale, medication(s)/side effects and non-pharmacologic comfort measures Outcome: Progressing   

## 2020-06-20 LAB — SURGICAL PATHOLOGY

## 2020-07-03 ENCOUNTER — Encounter: Payer: Self-pay | Admitting: General Surgery

## 2020-07-03 ENCOUNTER — Other Ambulatory Visit: Payer: Self-pay

## 2020-07-03 ENCOUNTER — Ambulatory Visit (INDEPENDENT_AMBULATORY_CARE_PROVIDER_SITE_OTHER): Payer: Medicare Other | Admitting: General Surgery

## 2020-07-03 VITALS — BP 139/76 | HR 71 | Temp 98.3°F | Ht 66.0 in | Wt 204.0 lb

## 2020-07-03 DIAGNOSIS — E890 Postprocedural hypothyroidism: Secondary | ICD-10-CM

## 2020-07-03 NOTE — Patient Instructions (Addendum)
Your voice will continue to improve with time.   You may reduce your calcium to once a day or stop it completely.  You should continue to take your thyroid medication in the morning.   You should have labs done in December with your wellness visit at Dr Ancil Boozer office. We will give you a prescription for this.  Otherwise you could contact Dr Sherren Mocha office to have them done in December.   You can use Guaifenesin  for the cough and mucus. Increase you fluid intake to help thin the mucus.   If your cough continues after a few months you may want to return to Dr Domingo Dimes office.   May rub Vitamin-E oil or other emmolient agent in area 2-3 times a day to soften. You will need to use sunscreen for the next year on the area to minimize altered pigmentation of the site.  Watch out for signs of heart palpitations, weight loss, sluggishness, constipation, or fatigue. These may be signs that your medication needs adjusting.   You may do activities as tolerated. Follow up here if needed.

## 2020-07-03 NOTE — Progress Notes (Signed)
Lauren Johnston is here today for a postoperative visit.  She is a 69 year old woman who underwent a total thyroidectomy for a substernal goiter on June 18, 2020.  Final pathology demonstrated the following:  SURGICAL PATHOLOGY  CASE: [id number]  PATIENT: [name]  Surgical Pathology Report      Specimen Submitted:  A. Total thyroid   Clinical History: Multinodular goiter.     DIAGNOSIS:  A. THYROID GLAND; TOTAL THYROIDECTOMY:  - BENIGN THYROID TISSUE WITH MULTINODULAR HYPERPLASIA AND DENSE  BACKGROUND FIBROSIS.  - NO PARATHYROID TISSUE IDENTIFIED.  - NEGATIVE FOR MALIGNANCY.   She states that she has been doing well since her surgery.  She still feels like her voice is slightly deep and raspy, but this has been improving.  Her pain has been well controlled.  She does report that she is still coughing up thick white "balls of phlegm".  She denies any paresthesias or other symptoms of hypocalcemia.  She is currently taking 600 mg of calcium citrate twice daily.  She is tolerating her levothyroxine without symptoms.  She denies any dysphagia.  Today's Vitals   07/03/20 0958  BP: 139/76  Pulse: 71  Temp: 98.3 F (36.8 C)  SpO2: 98%  Weight: 204 lb (92.5 kg)  Height: 5\' 6"  (1.676 m)   Body mass index is 32.93 kg/m.  Focused examination of the neck reveals a well approximated transverse thyroidectomy incision.  There is no erythema, induration, or drainage present.  The scar is slightly tethered at the center point.  Impression and plan: This is a 69 year old woman with a substernal goiter that was thought to be contributing to a chronic cough.  She states that her daytime coughing is better, but she continues to have issues with coughing at night.  This may be due to chronic postnasal drip or silent reflux.  There may also be a component of postsurgical inflammation that is still present and contributing to this.  I have asked her to wait another 4 weeks and then if she  continues to have symptoms, she may need to be referred back to see Dr. Patsey Berthold.  As far as her calcium goes, she may decrease her dose to once daily for bone health, which is what she was on prior to surgery.  She should have thyroid function test done in 4 to 6 weeks to confirm that her current dose of levothyroxine is appropriate or to make any adjustments as needed.  I provided her a written order so that she can have these done at her primary care provider's office; she has a Medicare well visit scheduled for December 2, which would be ideal timing.  At this time, she does not appear to have any ongoing endocrine surgical needs and I will see her on an as-needed basis.

## 2020-07-04 NOTE — Progress Notes (Signed)
Noted, thank you, issues with cough should also start getting better. But we will gladly see her again if needed.  LG

## 2020-07-16 ENCOUNTER — Encounter: Payer: Self-pay | Admitting: Pulmonary Disease

## 2020-07-16 ENCOUNTER — Ambulatory Visit (INDEPENDENT_AMBULATORY_CARE_PROVIDER_SITE_OTHER): Payer: Medicare Other | Admitting: Pulmonary Disease

## 2020-07-16 ENCOUNTER — Other Ambulatory Visit: Payer: Self-pay

## 2020-07-16 ENCOUNTER — Telehealth: Payer: Self-pay | Admitting: Pulmonary Disease

## 2020-07-16 DIAGNOSIS — J0410 Acute tracheitis without obstruction: Secondary | ICD-10-CM | POA: Diagnosis not present

## 2020-07-16 DIAGNOSIS — E890 Postprocedural hypothyroidism: Secondary | ICD-10-CM | POA: Diagnosis not present

## 2020-07-16 DIAGNOSIS — E048 Other specified nontoxic goiter: Secondary | ICD-10-CM

## 2020-07-16 DIAGNOSIS — R059 Cough, unspecified: Secondary | ICD-10-CM | POA: Diagnosis not present

## 2020-07-16 MED ORDER — PREDNISONE 10 MG (21) PO TBPK: ORAL_TABLET | ORAL | 0 refills | Status: DC

## 2020-07-16 MED ORDER — AZITHROMYCIN 250 MG PO TABS: ORAL_TABLET | ORAL | 0 refills | Status: AC

## 2020-07-16 NOTE — Telephone Encounter (Signed)
Called and spoke to patient to patient. Patient stated that she has developed a head cold. C/o nasal drainage yellow in color, productive cough with thick white sputum and night sweats. Sx have been present for 4d. Unsure if she has a fever, as she has not measured her tempeture. She has had booth covid vaccines. She has not had flu shot but plans to do so.  Not on any maintenance inhalers or rescue.  She alka seltzer plus with some relief in sx.   Patient is questioning if she can still home in for her 11:30 OV today/  Dr.Gonzalez,please advise. thanks

## 2020-07-16 NOTE — Telephone Encounter (Signed)
Spoke to patient, who stated that can no longer taste food and she is experiencing brain fog. I have spoken to Dr. Patsey Berthold verbally, who recommended covid testing.  Patient is aware and voiced her understanding.  Nothing further needed.

## 2020-07-16 NOTE — Telephone Encounter (Signed)
Can convert to phone visit if needed be.

## 2020-07-16 NOTE — Telephone Encounter (Signed)
Patient would like to proceed with phone visit. Appointment note has been updated to reflect this.  Nothing further needed.

## 2020-07-16 NOTE — Patient Instructions (Signed)
We will see him in follow-up in 4 to 6 weeks time with me or the nurse practitioner.  We have sent prescriptions to [facility] at [facility] for you 1 is for prednisone 1 for Azithromycin  Use Zyrtec 10 mg at bedtime

## 2020-07-16 NOTE — Progress Notes (Signed)
Subjective:    Patient ID: Lauren Johnston, female    DOB: 28-Apr-1951, 69 y.o.   MRN: [id number] ] Virtual Visit Via Video or Telephone Note:   This visit type was conducted due to national recommendations for restrictions regarding the COVID-19 pandemic .  This format is felt to be most appropriate for this patient at this time.  All issues noted in this document were discussed and addressed.  No physical exam was performed (except for noted visual exam findings with Video Visits).    I connected with Lauren Johnston by telephone at 11:30 AM and verified that I was speaking with the correct person using two identifiers. Location patient: home Location provider: [facility] Persons participating in the virtual visit: patient, physician   I discussed the limitations, risks, security and privacy concerns of performing an evaluation and management service by video and the availability of in person appointments. The patient expressed understanding and agreed to proceed.  HPI Patient is a 69 year old never smoker initially seen for chronic cough.  She carries a diagnosis of chronic mucopurulent bronchitis.  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  Total thyroidectomy on 18 June 2020.  Initially her cough got somewhat better however she has noted a sensation of "raspiness" and cough getting worse.  It is dry and nonproductive.  She also has noted "a head cold:Marland Kitchen  Recently had exposure to her daughter who is sick (Covid status unknown).  She has anosmia and dysgeusia.  She was instructed this morning to switch her visit to via telephone and also to seek COVID-19 testing.  She does not endorse any fevers, chills or sweats.   Review of Systems A 10 point review of systems was performed and it is as noted above otherwise negative.  Patient Active Problem List    Diagnosis Date Noted  . S/P total thyroidectomy 06/18/2020  . Class 1 obesity in adult 11/11/2016  . Osteopenia 10/09/2016  . Special screening for malignant neoplasms, colon   . Rectal polyp   . Right wrist tendonitis 05/16/2016  . Osteoarthritis of left hip 05/16/2016  . Vitamin D deficiency 02/05/2016  . Degeneration of intervertebral disc of cervical region 02/05/2016  . Insomnia 04/23/2015  . Major depression in remission ([place]) 04/23/2015  . Chronic pain 04/23/2015  . Goiter, nontoxic, 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  . 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 Uterine leiomyoma 06/16/2007   Allergies  Allergen Reactions  . Sulfamethoxazole-Trimethoprim Other (See Comments) and Rash  . Citalopram Itching  . Tetracycline Other (See Comments)    Other Reaction: URTICARIA  . Tetracyclines & Related Itching  . Codeine Itching   Current medications were discussed with the patient.  Immunization History  Administered Date(s) Administered  . Fluad Quad(high Dose 65+) 05/06/2019  . Influenza Split 06/16/2007, 06/19/2008, 09/07/2008, 07/05/2009, 04/25/2010  . Influenza, High Dose Seasonal PF 05/14/2017, 06/02/2018  . Influenza, Seasonal, Injecte, Preservative Fre 06/25/2011, 05/26/2012, 05/18/2013  . Influenza,inj,Quad PF,6+ Mos 06/07/2014, 04/23/2015, 05/07/2016  . Influenza-Unspecified 06/07/2014  . Moderna SARS-COVID-2 Vaccination 10/15/2019, 11/12/2019  . Pneumococcal Conjugate-13 07/17/2017  . Pneumococcal Polysaccharide-23 02/05/2016  . Tdap 08/01/2010  . Zoster 04/07/2011        Objective:   Physical Exam  No physical exam performed as the visit was via telephone.  Patient did not exhibit conversational dyspnea during the visit.  She did sound nasally congested.     Assessment &  Plan:     ICD-10-CM   1. Cough  R05.9    Associated with anosmia and dysgeusia COVID-19 testing recommended   2. Tracheitis  J04.10    Likely postoperative Prednisone taper plus  azithromycin Z-Pak  3. 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 S/P thyroidectomy  E89.0    Appears to be doing well in this regard   Meds ordered this encounter  Medications  . predniSONE (STERAPRED UNI-PAK 21 TAB) 10 MG (21) TBPK tablet    Sig: Take as directed in the package.    Dispense:  1 each    Refill:  0  . azithromycin (ZITHROMAX) 250 MG tablet    Sig: Take 2 tablets (500 mg) on  Day 1,  followed by 1 tablet (250 mg) once daily on Days 2 through 5.    Dispense:  6 each    Refill:  0   Discussion:  Patient is status post thyroidectomy for mediastinal goiter that had impingement on the trachea.  From the dyspnea standpoint she is doing very well after thyroidectomy.  She did develop issues with dry cough after the procedure, this has been aggravated by "head cold" over the last few days.  She has had exposure to her daughter who was ill (unknown Covid status).  The patient is having anosmia and dysgeusia as well.  We will treat her empirically for a potential tracheitis post intubation during thyroidectomy.  However patient has been advised to have COVID-19 testing.  Suspect she may have COVID-19.  We will see her in follow-up in his to contact us prior to that time should any new difficulties arise.  She is also to contact us should her COVID-19 testing come back positive.  Total visit time 18 minutes.  Renold Don, MD [facility]   *This note was dictated using voice recognition software/Dragon.  Despite best efforts to proofread, errors can occur which can change the meaning.  Any change was purely unintentional.

## 2020-07-17 DIAGNOSIS — Z20822 Contact with and (suspected) exposure to covid-19: Secondary | ICD-10-CM | POA: Diagnosis not present

## 2020-07-19 DIAGNOSIS — Z20822 Contact with and (suspected) exposure to covid-19: Secondary | ICD-10-CM | POA: Diagnosis not present

## 2020-07-23 ENCOUNTER — Telehealth: Payer: Self-pay | Admitting: Pulmonary Disease

## 2020-07-23 MED ORDER — PREDNISONE 10 MG (21) PO TBPK: ORAL_TABLET | ORAL | 0 refills | Status: DC

## 2020-07-23 NOTE — Telephone Encounter (Signed)
Called and spoke to patient. Patient states that she took two home covid test and both were positive  Patient had phone visit with Dr. Patsey Berthold on 07/16/2020 and was prescribed prednisone and zpak. patient stated that she accidentally threw away half of the prednisone. She completed the course of zpak.    She is experiencing productive cough with white mucus, brain fog and chest congestion. Sob is baseline.  Denied fever, chills or swears.  Dr. Patsey Berthold, please advise. Thanks.

## 2020-07-23 NOTE — Telephone Encounter (Signed)
Can certainly send another prednisone taper pack if needed.

## 2020-07-23 NOTE — Telephone Encounter (Signed)
Patient is aware of below message and voiced her understanding.  Rx for prednisone has been sent to preferred pharmacy. Nothing further needed.

## 2020-07-27 DIAGNOSIS — Z20822 Contact with and (suspected) exposure to covid-19: Secondary | ICD-10-CM | POA: Diagnosis not present

## 2020-08-02 ENCOUNTER — Other Ambulatory Visit: Payer: Self-pay

## 2020-08-02 ENCOUNTER — Ambulatory Visit (INDEPENDENT_AMBULATORY_CARE_PROVIDER_SITE_OTHER): Payer: Medicare Other

## 2020-08-02 VITALS — BP 104/64 | HR 89 | Temp 97.7°F | Resp 16 | Ht 66.0 in | Wt 199.6 lb

## 2020-08-02 DIAGNOSIS — Z780 Asymptomatic menopausal state: Secondary | ICD-10-CM | POA: Diagnosis not present

## 2020-08-02 DIAGNOSIS — Z0000 Encounter for general adult medical examination without abnormal findings: Secondary | ICD-10-CM | POA: Diagnosis not present

## 2020-08-02 DIAGNOSIS — Z1231 Encounter for screening mammogram for malignant neoplasm of breast: Secondary | ICD-10-CM | POA: Diagnosis not present

## 2020-08-02 DIAGNOSIS — Z23 Encounter for immunization: Secondary | ICD-10-CM | POA: Diagnosis not present

## 2020-08-02 NOTE — Patient Instructions (Signed)
Lauren Johnston , Thank you for taking time to come for your Medicare Wellness Visit. I appreciate your ongoing commitment to your health goals. Please review the following plan we discussed and let me know if I can assist you in the future.   Screening recommendations/referrals: Colonoscopy: done 06/02/16 Mammogram: done 09/01/19. Please call [phone] to schedule your mammogram and bone density screening.  Bone Density: done 10/09/16. Recommended yearly ophthalmology/optometry visit for glaucoma screening and checkup Recommended yearly dental visit for hygiene and checkup  Vaccinations: Influenza vaccine: done today Pneumococcal vaccine: done 07/17/17 Tdap vaccine: due Shingles vaccine: Shingrix discussed. Please contact your pharmacy for coverage information.  Covid-19: done 10/15/19 & 11/12/19  Advanced directives: Please bring a copy of your health care power of attorney and living will to the office at your convenience.  Conditions/risks identified: Recommend increasing physical activity to at least 3 days per week   Next appointment: Follow up in one year for your annual wellness visit    Preventive Care 65 Years and Older, Female Preventive care refers to lifestyle choices and visits with your health care provider that can promote health and wellness. What does preventive care include?  A yearly physical exam. This is also called an annual well check.  Dental exams once or twice a year.  Routine eye exams. Ask your health care provider how often you should have your eyes checked.  Personal lifestyle choices, including:  Daily care of your teeth and gums.  Regular physical activity.  Eating a healthy diet.  Avoiding tobacco and drug use.  Limiting alcohol use.  Practicing safe sex.  Taking low-dose aspirin every day.  Taking vitamin and mineral supplements as recommended by your health care provider. What happens during an annual well check? The services and  screenings done by your health care provider during your annual well check will depend on your age, overall health, lifestyle risk factors, and family history of disease. Counseling  Your health care provider may ask you questions about your:  Alcohol use.  Tobacco use.  Drug use.  Emotional well-being.  Home and relationship well-being.  Sexual activity.  Eating habits.  History of falls.  Memory and ability to understand (cognition).  Work and work Statistician.  Reproductive health. Screening  You may have the following tests or measurements:  Height, weight, and BMI.  Blood pressure.  Lipid and cholesterol levels. These may be checked every 5 years, or more frequently if you are over 78 years old.  Skin check.  Lung cancer screening. You may have this screening every year starting at age 43 if you have a 30-pack-year history of smoking and currently smoke or have quit within the past 15 years.  Fecal occult blood test (FOBT) of the stool. You may have this test every year starting at age 60.  Flexible sigmoidoscopy or colonoscopy. You may have a sigmoidoscopy every 5 years or a colonoscopy every 10 years starting at age 40.  Hepatitis C blood test.  Hepatitis B blood test.  Sexually transmitted disease (STD) testing.  Diabetes screening. This is done by checking your blood sugar (glucose) after you have not eaten for a while (fasting). You may have this done every 1-3 years.  Bone density scan. This is done to screen for osteoporosis. You may have this done starting at age 47.  Mammogram. This may be done every 1-2 years. Talk to your health care provider about how often you should have regular mammograms. Talk with your health care provider  about your test results, treatment options, and if necessary, the need for more tests. Vaccines  Your health care provider may recommend certain vaccines, such as:  Influenza vaccine. This is recommended every  year.  Tetanus, diphtheria, and acellular pertussis (Tdap, Td) vaccine. You may need a Td booster every 10 years.  Zoster vaccine. You may need this after age 58.  Pneumococcal 13-valent conjugate (PCV13) vaccine. One dose is recommended after age 75.  Pneumococcal polysaccharide (PPSV23) vaccine. One dose is recommended after age 67. Talk to your health care provider about which screenings and vaccines you need and how often you need them. This information is not intended to replace advice given to you by your health care provider. Make sure you discuss any questions you have with your health care provider. Document Released: 09/14/2015 Document Revised: 05/07/2016 Document Reviewed: 06/19/2015 Elsevier Interactive Patient Education  2017 [employer] Prevention in the Home Falls can cause injuries. They can happen to people of all ages. There are many things you can do to make your home safe and to help prevent falls. What can I do on the outside of my home?  Regularly fix the edges of walkways and driveways and fix any cracks.  Remove anything that might make you trip as you walk through a door, such as a raised step or threshold.  Trim any bushes or trees on the path to your home.  Use bright outdoor lighting.  Clear any walking paths of anything that might make someone trip, such as rocks or tools.  Regularly check to see if handrails are loose or broken. Make sure that both sides of any steps have handrails.  Any raised decks and porches should have guardrails on the edges.  Have any leaves, snow, or ice cleared regularly.  Use sand or salt on walking paths during winter.  Clean up any spills in your garage right away. This includes oil or grease spills. What can I do in the bathroom?  Use night lights.  Install grab bars by the toilet and in the tub and shower. Do not use towel bars as grab bars.  Use non-skid mats or decals in the tub or shower.  If you  need to sit down in the shower, use a plastic, non-slip stool.  Keep the floor dry. Clean up any water that spills on the floor as soon as it happens.  Remove soap buildup in the tub or shower regularly.  Attach bath mats securely with double-sided non-slip rug tape.  Do not have throw rugs and other things on the floor that can make you trip. What can I do in the bedroom?  Use night lights.  Make sure that you have a light by your bed that is easy to reach.  Do not use any sheets or blankets that are too big for your bed. They should not hang down onto the floor.  Have a firm chair that has side arms. You can use this for support while you get dressed.  Do not have throw rugs and other things on the floor that can make you trip. What can I do in the kitchen?  Clean up any spills right away.  Avoid walking on wet floors.  Keep items that you use a lot in easy-to-reach places.  If you need to reach something above you, use a strong step stool that has a grab bar.  Keep electrical cords out of the way.  Do not use floor polish or  wax that makes floors slippery. If you must use wax, use non-skid floor wax.  Do not have throw rugs and other things on the floor that can make you trip. What can I do with my stairs?  Do not leave any items on the stairs.  Make sure that there are handrails on both sides of the stairs and use them. Fix handrails that are broken or loose. Make sure that handrails are as long as the stairways.  Check any carpeting to make sure that it is firmly attached to the stairs. Fix any carpet that is loose or worn.  Avoid having throw rugs at the top or bottom of the stairs. If you do have throw rugs, attach them to the floor with carpet tape.  Make sure that you have a light switch at the top of the stairs and the bottom of the stairs. If you do not have them, ask someone to add them for you. What else can I do to help prevent falls?  Wear shoes  that:  Do not have high heels.  Have rubber bottoms.  Are comfortable and fit you well.  Are closed at the toe. Do not wear sandals.  If you use a stepladder:  Make sure that it is fully opened. Do not climb a closed stepladder.  Make sure that both sides of the stepladder are locked into place.  Ask someone to hold it for you, if possible.  Clearly mark and make sure that you can see:  Any grab bars or handrails.  First and last steps.  Where the edge of each step is.  Use tools that help you move around (mobility aids) if they are needed. These include:  Canes.  Walkers.  Scooters.  Crutches.  Turn on the lights when you go into a dark area. Replace any light bulbs as soon as they burn out.  Set up your furniture so you have a clear path. Avoid moving your furniture around.  If any of your floors are uneven, fix them.  If there are any pets around you, be aware of where they are.  Review your medicines with your doctor. Some medicines can make you feel dizzy. This can increase your chance of falling. Ask your doctor what other things that you can do to help prevent falls. This information is not intended to replace advice given to you by your health care provider. Make sure you discuss any questions you have with your health care provider. Document Released: 06/14/2009 Document Revised: 01/24/2016 Document Reviewed: 09/22/2014 Elsevier Interactive Patient Education  2017 [employer].

## 2020-08-02 NOTE — Progress Notes (Signed)
Subjective:   Lauren Johnston is a 69 y.o. female who presents for Medicare Annual (Subsequent) preventive examination.  Review of Systems     Cardiac Risk Factors include: advanced age (>52mn, >>36women);dyslipidemia;obesity (BMI >30kg/m2)     Objective:    Today's Vitals   08/02/20 1052  BP: 104/64  Pulse: 89  Resp: 16  Temp: 97.7 F (36.5 C)  TempSrc: Oral  SpO2: 99%  Weight: 199 lb 9.6 oz (90.5 kg)  Height: '5\' 6"'  (1.676 m)   Body mass index is 32.22 kg/m.  Advanced Directives 08/02/2020 06/18/2020 06/12/2020 07/26/2019 07/20/2018 05/14/2017 05/06/2017  Does Patient Have a Medical Advance Directive? Yes Yes No Yes Yes No No  Type of AParamedicof AAcampoLiving will Living will;Healthcare Power of AFair OaksLiving will HRussellLiving will - -  Does patient want to make changes to medical advance directive? - No - Patient declined - - - - -  Copy of [place] Chart? No - copy requested No - copy requested - No - copy requested No - copy requested - -  Would patient like information on creating a medical advance directive? - No - Patient declined - - - - -    Current Medications (verified) Outpatient Encounter Medications as of 08/02/2020  Medication Sig   acetaminophen (TYLENOL) 500 MG tablet Take 500 mg by mouth every 6 (six) hours as needed.   aspirin 81 MG tablet Take 81 mg by mouth daily.   atorvastatin (LIPITOR) 40 MG tablet Take 1 tablet (40 mg total) by mouth daily. (Patient taking differently: Take 40 mg by mouth at bedtime. )   Calcium Carbonate (CALCIUM 600 PO) Take 600 mg by mouth in the morning and at bedtime.   Cholecalciferol (VITAMIN D) 50 MCG (2000 UT) CAPS Take 2,000 Units by mouth daily.   DULoxetine (CYMBALTA) 60 MG capsule Take 1 capsule (60 mg total) by mouth daily. (Patient taking differently: Take 60 mg by mouth every morning. )   estradiol  (ESTRACE) 0.5 MG tablet TAKE 1 TABLET BY MOUTH EVERY DAY (Patient taking differently: Take 0.5 mg by mouth daily. )   levothyroxine (SYNTHROID) 137 MCG tablet Take 1 tablet (137 mcg total) by mouth daily at 6 (six) AM.   traZODone (DESYREL) 50 MG tablet Take 0.5-1 tablets (25-50 mg total) by mouth at bedtime as needed for sleep.   Biotin w/ Vitamins C & E (HAIR SKIN & NAILS GUMMIES PO) Take 2 tablets by mouth every other day.  (Patient not taking: Reported on 08/02/2020)   [DISCONTINUED] predniSONE (STERAPRED UNI-PAK 21 TAB) 10 MG (21) TBPK tablet Take as directed in the package.   [DISCONTINUED] predniSONE (STERAPRED UNI-PAK 21 TAB) 10 MG (21) TBPK tablet Use as directed.   No facility-administered encounter medications on file as of 08/02/2020.    Allergies (verified) Sulfamethoxazole-trimethoprim, Citalopram, Tetracycline, Tetracyclines & related, and Codeine   History: Past Medical History:  Diagnosis Date   Arthritis    hips   Chronic kidney disease    STAGE 3   Dental crowns present    dental implants - upper   Depression    GERD (gastroesophageal reflux disease)    Hyperlipidemia    Hypothyroidism    Thyroid disease    Past Surgical History:  Procedure Laterality Date   ABDOMINAL HYSTERECTOMY     BILATERAL OOPHORECTOMY     COLONOSCOPY WITH PROPOFOL N/A 06/02/2016   Procedure: COLONOSCOPY  WITH PROPOFOL;  Surgeon: Lucilla Lame, MD;  Location: [place];  Service: Endoscopy;  Laterality: N/A;   dequavian tendonitis Left    KNEE ARTHROSCOPY Bilateral    POLYPECTOMY  06/02/2016   Procedure: POLYPECTOMY;  Surgeon: Lucilla Lame, MD;  Location: [place];  Service: Endoscopy;;   THYROIDECTOMY N/A 06/18/2020   Procedure: THYROIDECTOMY, total;  Surgeon: Fredirick Maudlin, MD;  Location: [facility];  Service: General;  Laterality: N/A;   TONSILLECTOMY     AGE 13 OR 55   Family History  Problem Relation Age of Onset   Diabetes Mother    Heart  disease Mother    Emphysema Father    Heart disease Father    COPD Father    Multiple myeloma Brother    Alzheimer's disease Sister    Dementia Sister    Social History   Socioeconomic History   Marital status: Married    Spouse name: Not on file   Number of children: 3   Years of education: Not on file   Highest education level: Some college, no degree  Occupational History   Occupation: retired  Tobacco Use   Smoking status: Never Smoker   Smokeless tobacco: Never Used  Scientific laboratory technician Use: Never used  Substance and Sexual Activity   Alcohol use: No    Alcohol/week: 0.0 standard drinks   Drug use: No   Sexual activity: Yes    Partners: Male    Birth control/protection: Post-menopausal    Comment: Hysterectomy  Other Topics Concern   Not on file  Social History Narrative   Widow, but remarried Dec 8th 2018   Social Determinants of Health   Financial Resource Strain: Low Risk    Difficulty of Paying Living Expenses: Not hard at all  Food Insecurity: No Food Insecurity   Worried About Charity fundraiser in the Last Year: Never true   Arboriculturist in the Last Year: Never true  Transportation Needs: No Transportation Needs   Lack of Transportation (Medical): No   Lack of Transportation (Non-Medical): No  Physical Activity: Inactive   Days of Exercise per Week: 0 days   Minutes of Exercise per Session: 0 min  Stress: No Stress Concern Present   Feeling of Stress : Only a little  Social Connections: Engineer, building services of Communication with Friends and Family: More than three times a week   Frequency of Social Gatherings with Friends and Family: Once a week   Attends Religious Services: More than 4 times per year   Active Member of [employer] or Organizations: Yes   Attends Music therapist: More than 4 times per year   Marital Status: Married    Tobacco Counseling Counseling given: Not  Answered   Clinical Intake:  Pre-visit preparation completed: Yes  Pain : No/denies pain     BMI - recorded: 32.22 Nutritional Status: BMI > 30  Obese Nutritional Risks: None Diabetes: No  How often do you need to have someone help you when you read instructions, pamphlets, or other written materials from your doctor or pharmacy?: 1 - Never    Interpreter Needed?: No  Information entered by :: Clemetine Marker LPN   Activities of Daily Living In your present state of health, do you have any difficulty performing the following activities: 08/02/2020 06/18/2020  Hearing? N N  Comment declines hearing aids -  Vision? N N  Difficulty concentrating or making decisions? Y N  Walking  or climbing stairs? N N  Dressing or bathing? N N  Doing errands, shopping? N N  Preparing Food and eating ? N -  Using the Toilet? N -  In the past six months, have you accidently leaked urine? N -  Do you have problems with loss of bowel control? N -  Managing your Medications? N -  Managing your Finances? N -  Housekeeping or managing your Housekeeping? N -  Some recent data might be hidden    Patient Care Team: Steele Sizer, MD as PCP - General (Family Medicine) Gabriel Carina, Betsey Holiday, MD as Physician Assistant (Endocrinology) Lovell Sheehan, MD as Consulting Physician (Orthopedic Surgery)  Indicate any recent Medical Services you may have received from other than [facility] providers in the past year (date may be approximate).     Assessment:   This is a routine wellness examination for [place].  Hearing/Vision screen  Hearing Screening   '125Hz'  '250Hz'  '500Hz'  '1000Hz'  '2000Hz'  '3000Hz'  '4000Hz'  '6000Hz'  '8000Hz'   Right ear:           Left ear:           Comments: Pt denies hearing difficulty  Vision Screening Comments: [facility]  Dietary issues and exercise activities discussed: Current Exercise Habits: The patient does not participate in regular exercise at present,  Exercise limited by: None identified  Goals     Increase physical activity     Increase physical activity to 150 minutes per week      Depression Screen PHQ 2/9 Scores 08/02/2020 04/16/2020 11/14/2019 09/29/2019 07/26/2019 05/06/2019 11/02/2018  PHQ - 2 Score 0 0 0 0 0 4 0  PHQ- 9 Score - 0 0 0 - 9 2    Fall Risk Fall Risk  08/02/2020 04/16/2020 04/12/2020 04/06/2020 11/14/2019  Falls in the past year? 0 0 0 0 0  Number falls in past yr: 0 0 0 0 0  Injury with Fall? 0 0 0 0 0  Risk for fall due to : No Fall Risks - - - -  Follow up Falls prevention discussed - - - -    Any stairs in or around the home? Yes  If so, are there any without handrails? No  Home free of loose throw rugs in walkways, pet beds, electrical cords, etc? Yes  Adequate lighting in your home to reduce risk of falls? Yes   ASSISTIVE DEVICES UTILIZED TO PREVENT FALLS:  Life alert? No  Use of a cane, walker or w/c? No  Grab bars in the bathroom? Yes  Shower chair or bench in shower? Yes  Elevated toilet seat or a handicapped toilet? Yes   TIMED UP AND GO:  Was the test performed? Yes .  Length of time to ambulate 10 feet: 5 sec.   Gait steady and fast without use of assistive device  Cognitive Function:     6CIT Screen 08/02/2020 07/20/2018  What Year? 0 points 0 points  What month? 0 points 0 points  What time? 0 points 0 points  Count back from 20 0 points 0 points  Months in reverse 0 points 0 points  Repeat phrase 2 points 2 points  Total Score 2 2    Immunizations Immunization History  Administered Date(s) Administered   Fluad Quad(high Dose 65+) 05/06/2019, 08/02/2020   Influenza Split 06/16/2007, 06/19/2008, 09/07/2008, 07/05/2009, 04/25/2010   Influenza, High Dose Seasonal PF 05/14/2017, 06/02/2018   Influenza, Seasonal, Injecte, Preservative Fre 06/25/2011, 05/26/2012, 05/18/2013  Influenza,inj,Quad PF,6+ Mos 06/07/2014, 04/23/2015, 05/07/2016   Influenza-Unspecified 06/07/2014    Moderna SARS-COVID-2 Vaccination 10/15/2019, 11/12/2019   Pneumococcal Conjugate-13 07/17/2017   Pneumococcal Polysaccharide-23 02/05/2016   Tdap 08/01/2010   Zoster 04/07/2011    TDAP status: Due, Education has been provided regarding the importance of this vaccine. Advised may receive this vaccine at local pharmacy or Health Dept. Aware to provide a copy of the vaccination record if obtained from local pharmacy or Health Dept. Verbalized acceptance and understanding.   Flu Vaccine status: Completed at today's visit   Pneumococcal vaccine status: Up to date   Covid-19 vaccine status: Completed vaccines  Qualifies for Shingles Vaccine? Yes   Zostavax completed Yes   Shingrix Completed?: No.    Education has been provided regarding the importance of this vaccine. Patient has been advised to call insurance company to determine out of pocket expense if they have not yet received this vaccine. Advised may also receive vaccine at local pharmacy or Health Dept. Verbalized acceptance and understanding.  Screening Tests Health Maintenance  Topic Date Due   TETANUS/TDAP  08/01/2020   COLONOSCOPY  06/02/2021   MAMMOGRAM  08/31/2021   INFLUENZA VACCINE  Completed   DEXA SCAN  Completed   COVID-19 Vaccine  Completed   Hepatitis C Screening  Completed   PNA vac Low Risk Adult  Completed    Health Maintenance  Health Maintenance Due  Topic Date Due   TETANUS/TDAP  08/01/2020    Colorectal cancer screening: Completed 06/02/16. Repeat every 5 years   Mammogram status: Completed 09/01/19. Repeat every year   Bone Density status: Completed 10/09/16. Results reflect: Bone density results: OSTEOPENIA. Repeat every 2 years.  Lung Cancer Screening: (Low Dose CT Chest recommended if Age 68-80 years, 30 pack-year currently smoking OR have quit w/in 15years.) does not qualify.   Additional Screening:  Hepatitis C Screening: does qualify; Completed 07/17/17  Vision Screening:  Recommended annual ophthalmology exams for early detection of glaucoma and other disorders of the eye. Is the patient up to date with their annual eye exam?  Yes  Who is the provider or what is the name of the office in which the patient attends annual eye exams? [facility]  Dental Screening: Recommended annual dental exams for proper oral hygiene  Community Resource Referral / Chronic Care Management: CRR required this visit?  No   CCM required this visit?  No      Plan:     I have personally reviewed and noted the following in the patients chart:    Medical and social history  Use of alcohol, tobacco or illicit drugs   Current medications and supplements  Functional ability and status  Nutritional status  Physical activity  Advanced directives  List of other physicians  Hospitalizations, surgeries, and ER visits in previous 12 months  Vitals  Screenings to include cognitive, depression, and falls  Referrals and appointments  In addition, I have reviewed and discussed with patient certain preventive protocols, quality metrics, and best practice recommendations. A written personalized care plan for preventive services as well as general preventive health recommendations were provided to patient.     Clemetine Marker, LPN   93/04/1016   Nurse Notes: pt positive for Covid with symptoms starting 07/16/20; per patient she is now asymptomatic and reports a negative test last week. Pt requested flu vaccine today; okay to administer per Dr. Roxan Hockey. Pt plans for Covid booster at future date.   Pt c/o brain fog and states  her daughters are concerned about her memory especially given family history; pt advised to discuss with Dr. Ancil Boozer at next visit. 6CIT score of 2 today. Pt is otherwise doing well post thyroidectomy and appreciative of visit today.

## 2020-08-20 ENCOUNTER — Ambulatory Visit: Payer: Medicare Other | Admitting: Pulmonary Disease

## 2020-09-03 ENCOUNTER — Other Ambulatory Visit: Payer: Self-pay | Admitting: Family Medicine

## 2020-09-03 ENCOUNTER — Ambulatory Visit: Payer: Medicare Other | Admitting: Pulmonary Disease

## 2020-09-03 DIAGNOSIS — F5101 Primary insomnia: Secondary | ICD-10-CM

## 2020-09-03 DIAGNOSIS — Z780 Asymptomatic menopausal state: Secondary | ICD-10-CM

## 2020-09-03 NOTE — Telephone Encounter (Signed)
Medication Refill - Medication: estradiol (ESTRACE) 0.5 MG tablet  Pt is completely out and needs a refill / Pt has appt for 2.14.22  traZODone (DESYREL) 50 MG tablet Pt has a few of these left   Has the patient contacted their pharmacy? No. (Agent: If no, request that the patient contact the pharmacy for the refill.) (Agent: If yes, when and what did the pharmacy advise?)  Preferred Pharmacy (with phone number or street name):  [facility] - [place], [place] - [address] Phone:  [phone]  Fax:  [phone]       Agent: Please be advised that RX refills may take up to 3 business days. We ask that you follow-up with your pharmacy.

## 2020-09-04 DIAGNOSIS — E890 Postprocedural hypothyroidism: Secondary | ICD-10-CM | POA: Diagnosis not present

## 2020-09-04 DIAGNOSIS — E042 Nontoxic multinodular goiter: Secondary | ICD-10-CM | POA: Diagnosis not present

## 2020-09-04 MED ORDER — ESTRADIOL 0.5 MG PO TABS: 0.5000 mg | ORAL_TABLET | Freq: Every day | ORAL | 1 refills | Status: DC

## 2020-09-04 MED ORDER — TRAZODONE HCL 50 MG PO TABS: 25.0000 mg | ORAL_TABLET | Freq: Every evening | ORAL | 0 refills | Status: DC | PRN

## 2020-09-17 ENCOUNTER — Ambulatory Visit: Payer: Medicare Other | Admitting: Pulmonary Disease

## 2020-09-20 ENCOUNTER — Other Ambulatory Visit: Payer: Self-pay

## 2020-09-20 ENCOUNTER — Encounter: Payer: Self-pay | Admitting: Pulmonary Disease

## 2020-09-20 ENCOUNTER — Ambulatory Visit: Payer: Medicare Other | Admitting: Pulmonary Disease

## 2020-09-20 ENCOUNTER — Ambulatory Visit (INDEPENDENT_AMBULATORY_CARE_PROVIDER_SITE_OTHER): Payer: Medicare Other | Admitting: Pulmonary Disease

## 2020-09-20 VITALS — BP 126/76 | HR 91 | Temp 97.1°F | Ht 66.0 in | Wt 196.0 lb

## 2020-09-20 DIAGNOSIS — Z8616 Personal history of COVID-19: Secondary | ICD-10-CM

## 2020-09-20 DIAGNOSIS — R059 Cough, unspecified: Secondary | ICD-10-CM

## 2020-09-20 DIAGNOSIS — E890 Postprocedural hypothyroidism: Secondary | ICD-10-CM | POA: Diagnosis not present

## 2020-09-20 DIAGNOSIS — E048 Other specified nontoxic goiter: Secondary | ICD-10-CM

## 2020-09-20 NOTE — Patient Instructions (Signed)
Your lungs sounded really clear today that is good.  Consider taking Zyrtec (cetirizine) 10 mg at bedtime, this should help with some of the residual phlegm you are producing.  We will see you in follow-up in 3 months time call sooner should any new problems arise.

## 2020-09-20 NOTE — Progress Notes (Signed)
Subjective:    Patient ID: Lauren Johnston, female    DOB: 11/30/1950, 70 y.o.   MRN: [id number]  HPI Patient is a 70 year old lifelong never smoker is here for issues of excessive cough and phlegm production.  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  She underwent thyroidectomy on 18 June 2020.  Since then she has noted that her phlegm production has decreased significantly.  She continues to have some issues mostly at nighttime when she lays down related to postnasal drip.  Her cough has continued to get better.  We did have a virtual visit with her on 16 July 2020.  At that time she was complaining of dysgeusia and anosmia.  COVID-19 testing was recommended and she tested positive.  She did not have to be hospitalized.  Her phlegm production which was getting better has increased somewhat after that.  Overall though she feels like she is improving day by day.  She has not had any fevers, chills or sweats since her COVID diagnosis.  He had been previously been vaccinated with Moderna vaccine.  Her anosmia is getting better.  She still has some mild dysgeusia.  She also notes some "brain fog".  For the most part however as noted she feels that she is improving.  She notes that dyspnea and cough improved dramatically after thyroidectomy.   Review of Systems A 10 point review of systems was performed and it is as noted above otherwise negative.  Patient Active Problem List   Diagnosis Date Noted  . S/P total thyroidectomy 06/18/2020  . Class 1 obesity in adult 11/11/2016  . Osteopenia 10/09/2016  . Special screening for malignant neoplasms, colon   . Rectal polyp   . Right wrist tendonitis 05/16/2016  . Osteoarthritis of left hip 05/16/2016  . Vitamin D deficiency 02/05/2016  . Degeneration of intervertebral disc of cervical region 02/05/2016  . Insomnia 04/23/2015  . Major depression in remission ([place]) 04/23/2015  . Chronic pain 04/23/2015  . Goiter, nontoxic, multinodular  02/20/2014  . 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 Uterine leiomyoma 06/16/2007   Allergies  Allergen Reactions  . Sulfamethoxazole-Trimethoprim Other (See Comments) and Rash  . Citalopram Itching  . Tetracycline Other (See Comments)    Other Reaction: URTICARIA  . Tetracyclines & Related Itching  . Codeine Itching   Current Meds  Medication Sig  . acetaminophen (TYLENOL) 500 MG tablet Take 500 mg by mouth every 6 (six) hours as needed.  Marland Kitchen aspirin 81 MG tablet Take 81 mg by mouth daily.  Marland Kitchen atorvastatin (LIPITOR) 40 MG tablet Take 1 tablet (40 mg total) by mouth daily. (Patient taking differently: Take 40 mg by mouth at bedtime.)  . Biotin w/ Vitamins C & E (HAIR SKIN & NAILS GUMMIES PO) Take 2 tablets by mouth every other day.  . Calcium Carbonate (CALCIUM 600 PO) Take 600 mg by mouth in the morning and at bedtime.  . Cholecalciferol (VITAMIN D) 50 MCG (2000 UT) CAPS Take 2,000 Units by mouth daily.  . DULoxetine (CYMBALTA) 60 MG capsule Take 1 capsule (60 mg total) by mouth daily. (Patient taking differently: Take 60 mg by mouth every morning.)  . estradiol (ESTRACE) 0.5 MG tablet Take 1 tablet (0.5 mg total) by mouth daily.  Marland Kitchen levothyroxine (SYNTHROID) 137 MCG tablet Take 1 tablet (137 mcg total) by mouth daily at 6 (six) AM.  . traZODone (DESYREL) 50 MG tablet Take 0.5-1 tablets (25-50 mg total) by mouth at bedtime as needed for sleep.  Immunization History  Administered Date(s) Administered  . Fluad Quad(high Dose 65+) 05/06/2019, 08/02/2020  . Influenza Split 06/16/2007, 06/19/2008, 09/07/2008, 07/05/2009, 04/25/2010  . Influenza, High Dose Seasonal PF 05/14/2017, 06/02/2018  . Influenza, Seasonal, Injecte, Preservative Fre 06/25/2011, 05/26/2012, 05/18/2013  . Influenza,inj,Quad PF,6+ Mos 06/07/2014, 04/23/2015, 05/07/2016  . Influenza-Unspecified 06/07/2014  . Moderna Sars-Covid-2 Vaccination 10/15/2019, 11/12/2019  . Pneumococcal Conjugate-13 07/17/2017  .  Pneumococcal Polysaccharide-23 02/05/2016  . Tdap 08/01/2010  . Zoster 04/07/2011        Objective:   Physical Exam BP 126/76 (BP Location: Left Arm, Cuff Size: Normal)   Pulse 91   Temp (!) 97.1 F (36.2 C) (Temporal)   Ht 5\' 6"  (1.676 m)   Wt 196 lb (88.9 kg)   SpO2 99%   BMI 31.64 kg/m   GENERAL:Obese woman, no acute distress, fully ambulatory.  No conversational dyspnea.  Looks younger than stated age. HEAD: Normocephalic, atraumatic.  EYES: Pupils equal, round, reactive to light. No scleral icterus.  MOUTH:Nose/mouth/throat not examined due to masking requirements for COVID 19. NECK: Supple. Trachea midline. No JVD. No adenopathy.  Well-healed thyroidectomy scar. PULMONARY:Good air entry bilaterally.Lungs clear to auscultation bilaterally. CARDIOVASCULAR: S1 and S2. Regular rate and rhythm.No rubs, murmurs or gallops. GASTROINTESTINAL:Benign. MUSCULOSKELETAL: No joint deformity, no clubbing, no edema.  NEUROLOGIC:No focal deficits, no gait disturbance. Speech is fluent. SKIN: Intact,warm,dry.Limited exam, no rashes PSYCH:Mood and behavior appropriate.      Assessment & Plan:     ICD-10-CM   1. Cough  R05.9    Continues to improve post thyroidectomy Postnasal drip Trial of Zyrtec,10 mg p.o. at nighttime  2. 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 S/P thyroidectomy  E89.0    Patient notes ongoing improvement after surgery  4. Personal history of COVID-19  Z86.16    This issue adds complexity to her management   Discussion: The patient has noted marked improvement on cough and phlegm production after her thyroidectomy for a mediastinal goiter with associated tracheal deviation.  Most of her issues now appear to be related to postnasal drip at bedtime.  We will give her a trial of Zyrtec 10 mg at bedtime.  She recently had COVID-19 infection, this aggravated her phlegm production.  This is again becoming less of an issue.  Recent pulmonary  function testing and July 2021 was essentially nonrevealing.  We will see the patient in follow-up once time she is to contact us prior to that time should any new difficulties arise.  Renold Don, MD [facility]   *This note was dictated using voice recognition software/Dragon.  Despite best efforts to proofread, errors can occur which can change the meaning.  Any change was purely unintentional.

## 2020-10-03 ENCOUNTER — Encounter: Payer: Self-pay | Admitting: Pulmonary Disease

## 2020-10-12 NOTE — Progress Notes (Signed)
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 Celine Ahr 04/12/2020 . She has a thyroidectomy 06/18/2020 , last TSH done by endo 09/04/2020 was suppressed. We will recheck labs done and fax to [facility] - Dr. Honor Junes   Chronic cough: seen by Dr. Patsey Berthold recently, she is on zyrtec at night and states cough not as severe now since thyroidectomy but worse at night   Right shoulder pain: she does not recall any injury, she continues to have some discomfort but normal rom, just some soreness on right deltoid area   Hyperlipidemia: taking Atorvastatin, reviewed last labs , LDL was 94, HDL was low, reminded her to eat more fish and continue  tree nuts daily   OA both knees:stable, taking tylenol prn now, no effusion or redness. Mild soreness but not affecting qualify of life   Insomnia she has been sleeping a little better with half dose of Trazodone  Memory changes: she complained of mild memory changes last year, however had COVID end of October and daughter's and husband have noticed a significant change in her memory since she had COVID-19, she got lost going home , she called her husband and got redirected. She is very worried because her mother, brother and sister have a history of Alzheimer's. She is now afraid of going out at night. She is able to cook and plan meals. She feels insecure when cooking because she is afraid of making mistakes.   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 She states lately she is afraid to going out, secondary to memory changes. She states she does not feel depressed   Patient Active Problem List   Diagnosis Date Noted  . 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 Pure hypercholesterolemia 10/15/2020  . Stage 3a chronic kidney disease ([place]) 10/15/2020  . Memory changes 10/15/2020  . S/P total thyroidectomy 06/18/2020  . Class 1 obesity in adult 11/11/2016  . Osteopenia 10/09/2016  . Special screening for malignant neoplasms, colon   . Rectal polyp   . Right wrist tendonitis 05/16/2016  . Osteoarthritis of left hip 05/16/2016  . Vitamin D deficiency 02/05/2016  . Degeneration of intervertebral disc of cervical region 02/05/2016  . Insomnia 04/23/2015  . Major depression in remission ([place]) 04/23/2015  . Chronic pain 04/23/2015  . Goiter, nontoxic, 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  . 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 Uterine leiomyoma 06/16/2007    Past Surgical History:  Procedure Laterality Date  . ABDOMINAL HYSTERECTOMY    . BILATERAL OOPHORECTOMY    . COLONOSCOPY WITH PROPOFOL N/A 06/02/2016   Procedure: COLONOSCOPY WITH PROPOFOL;  Surgeon: Lucilla Lame, MD;  Location: [place];  Service: Endoscopy;  Laterality: N/A;  . dequavian tendonitis Left   . KNEE ARTHROSCOPY Bilateral   .  POLYPECTOMY  06/02/2016   Procedure: POLYPECTOMY;  Surgeon: Lucilla Lame, MD;  Location: [place];  Service: Endoscopy;;  . THYROIDECTOMY N/A 06/18/2020   Procedure: THYROIDECTOMY, total;  Surgeon: Fredirick Maudlin, MD;  Location: [facility];  Service: General;  Laterality: N/A;  . TONSILLECTOMY     AGE 70 OR 11    Family History  Problem Relation Age of Onset  . Diabetes Mother   . Heart disease Mother   . Emphysema Father   . Heart disease Father   . COPD Father   .  Multiple myeloma Brother   . Alzheimer's disease Sister   . Dementia Sister     Social History   Tobacco Use  . Smoking status: Never Smoker  . Smokeless tobacco: Never Used  Substance Use Topics  . Alcohol use: No    Alcohol/week: 0.0 standard drinks     Current Outpatient Medications:  .  acetaminophen (TYLENOL) 500 MG tablet, Take 500 mg by mouth every 6 (six) hours as needed., Disp: , Rfl:  .  aspirin 81 MG tablet, Take 81 mg by mouth daily., Disp: , Rfl:  .  atorvastatin (LIPITOR) 40 MG tablet, Take 1 tablet (40 mg total) by mouth daily. (Patient taking differently: Take 40 mg by mouth at bedtime.), Disp: 90 tablet, Rfl: 1 .  Calcium Carbonate (CALCIUM 600 PO), Take 600 mg by mouth in the morning and at bedtime., Disp: , Rfl:  .  Cholecalciferol (VITAMIN D) 50 MCG (2000 UT) CAPS, Take 2,000 Units by mouth daily., Disp: , Rfl:  .  DULoxetine (CYMBALTA) 60 MG capsule, Take 1 capsule (60 mg total) by mouth daily. (Patient taking differently: Take 60 mg by mouth every morning.), Disp: 90 capsule, Rfl: 1 .  estradiol (ESTRACE) 0.5 MG tablet, Take 1 tablet (0.5 mg total) by mouth daily., Disp: 90 tablet, Rfl: 1 .  levothyroxine (SYNTHROID) 137 MCG tablet, Take 1 tablet (137 mcg total) by mouth daily at 6 (six) AM., Disp: 30 tablet, Rfl: 3 .  traZODone (DESYREL) 50 MG tablet, Take 0.5-1 tablets (25-50 mg total) by mouth at bedtime as needed for sleep., Disp: 60 tablet, Rfl: 0 .  Biotin w/ Vitamins C & E (HAIR SKIN & NAILS GUMMIES PO), Take 2 tablets by mouth every other day. (Patient not taking: Reported on 10/15/2020), Disp: , Rfl:   Allergies  Allergen Reactions  . Sulfamethoxazole-Trimethoprim Other (See Comments) and Rash  . Citalopram Itching  . Tetracycline Other (See Comments)    Other Reaction: URTICARIA  . Tetracyclines & Related Itching  . Codeine Itching    I personally reviewed active problem list, medication list, allergies, family history, social history, health  maintenance with the patient/caregiver today.   ROS  Constitutional: Negative for fever or weight change.  Respiratory: positive  for cough but no  shortness of breath.   Cardiovascular: Negative for chest pain or palpitations.  Gastrointestinal: Negative for abdominal pain, no bowel changes.  Musculoskeletal: Negative for gait problem or joint swelling.  Skin: Negative for rash.  Neurological: Negative for dizziness or headache.  No other specific complaints in a complete review of systems (except as listed in HPI above).   Objective  Vitals:   10/15/20 1419  BP: 112/78  Pulse: 94  Resp: 16  Temp: 98.1 F (36.7 C)  TempSrc: Oral  SpO2: 98%  Weight: 198 lb 9.6 oz (90.1 kg)  Height: '5\' 6"'  (1.676 m)    Body mass index is 32.05 kg/m.  Physical Exam  Constitutional: Patient appears well-developed and well-nourished. ObeseNo distress.  HEENT: head atraumatic, normocephalic, pupils equal and reactive to light,, neck supple, well healed scar from thyroidectomy  Cardiovascular: Normal rate, regular rhythm and normal heart sounds.  No murmur heard. No BLE edema. Pulmonary/Chest: Effort normal and breath sounds normal. No respiratory distress. Abdominal: Soft.  There is no tenderness. Psychiatric: Patient has a normal mood and affect. behavior is normal. Judgment and thought content normal.  PHQ2/9: Depression screen [facility] 2/9 10/15/2020 08/02/2020 04/16/2020 11/14/2019 09/29/2019  Decreased Interest 1 0 0 0 0  Down, Depressed, Hopeless 0 0 0 0 0  PHQ - 2 Score 1 0 0 0 0  Altered sleeping 1 - 0 0 0  Tired, decreased energy 1 - 0 0 0  Change in appetite 0 - 0 0 0  Feeling bad or failure about yourself  0 - 0 0 0  Trouble concentrating 0 - 0 0 0  Moving slowly or fidgety/restless 0 - 0 0 0  Suicidal thoughts 0 - 0 0 0  PHQ-9 Score 3 - 0 0 0  Difficult doing work/chores Not difficult at all - - - -  Some recent data might be hidden    phq 9 is positive   Fall Risk: Fall Risk   10/15/2020 08/02/2020 04/16/2020 04/12/2020 04/06/2020  Falls in the past year? 0 0 0 0 0  Number falls in past yr: 0 0 0 0 0  Injury with Fall? 0 0 0 0 0  Risk for fall due to : - No Fall Risks - - -  Follow up - Falls prevention discussed - - -    Functional Status Survey: Is the patient deaf or have difficulty hearing?: No Does the patient have difficulty seeing, even when wearing glasses/contacts?: No Does the patient have difficulty concentrating, remembering, or making decisions?: Yes Does the patient have difficulty walking or climbing stairs?: No Does the patient have difficulty dressing or bathing?: No Does the patient have difficulty doing errands alone such as visiting a doctor's office or shopping?: No   Assessment & Plan  1. Memory changes  - CBC with Differential/Platelet - COMPLETE METABOLIC PANEL WITH GFR - Vitamin B12 - Ambulatory referral to Neurology  2. Stage 3a chronic kidney disease ([place])   3. Vitamin D deficiency  - VITAMIN D 25 Hydroxy (Vit-D Deficiency, Fractures)  4. History of thyroidectomy  - TSH  5. Pure hypercholesterolemia  - Lipid panel  6. Major depression Mild (HCC)  Continue Duloxetine   7. 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

## 2020-10-15 ENCOUNTER — Other Ambulatory Visit: Payer: Self-pay

## 2020-10-15 ENCOUNTER — Encounter: Payer: Self-pay | Admitting: Family Medicine

## 2020-10-15 ENCOUNTER — Ambulatory Visit (INDEPENDENT_AMBULATORY_CARE_PROVIDER_SITE_OTHER): Payer: Medicare Other | Admitting: Family Medicine

## 2020-10-15 VITALS — BP 112/78 | HR 94 | Temp 98.1°F | Resp 16 | Ht 66.0 in | Wt 198.6 lb

## 2020-10-15 DIAGNOSIS — N1831 Chronic kidney disease, stage 3a: Secondary | ICD-10-CM | POA: Diagnosis not present

## 2020-10-15 DIAGNOSIS — G3184 Mild cognitive impairment, so stated: Secondary | ICD-10-CM | POA: Insufficient documentation

## 2020-10-15 DIAGNOSIS — E7800 Pure hypercholesterolemia, unspecified: Secondary | ICD-10-CM

## 2020-10-15 DIAGNOSIS — U099 Post covid-19 condition, unspecified: Secondary | ICD-10-CM

## 2020-10-15 DIAGNOSIS — F320 Major depressive disorder, single episode, mild: Secondary | ICD-10-CM

## 2020-10-15 DIAGNOSIS — R413 Other amnesia: Secondary | ICD-10-CM | POA: Diagnosis not present

## 2020-10-15 DIAGNOSIS — E890 Postprocedural hypothyroidism: Secondary | ICD-10-CM

## 2020-10-15 DIAGNOSIS — E559 Vitamin D deficiency, unspecified: Secondary | ICD-10-CM

## 2020-10-15 DIAGNOSIS — R439 Unspecified disturbances of smell and taste: Secondary | ICD-10-CM

## 2020-10-15 DIAGNOSIS — F5101 Primary insomnia: Secondary | ICD-10-CM

## 2020-10-15 MED ORDER — DULOXETINE HCL 60 MG PO CPEP: 60.0000 mg | ORAL_CAPSULE | ORAL | 1 refills | Status: DC

## 2020-10-15 MED ORDER — TRAZODONE HCL 50 MG PO TABS: 25.0000 mg | ORAL_TABLET | Freq: Every evening | ORAL | 0 refills | Status: DC | PRN

## 2020-10-15 MED ORDER — ATORVASTATIN CALCIUM 40 MG PO TABS: 40.0000 mg | ORAL_TABLET | Freq: Every day | ORAL | 1 refills | Status: DC

## 2020-10-16 LAB — CBC WITH DIFFERENTIAL/PLATELET
Absolute Monocytes: 619 cells/uL (ref 200–950)
Basophils Absolute: 31 cells/uL (ref 0–200)
Basophils Relative: 0.6 %
Eosinophils Absolute: 328 cells/uL (ref 15–500)
Eosinophils Relative: 6.3 %
HCT: 39.4 % (ref 35.0–45.0)
Hemoglobin: 13 g/dL (ref 11.7–15.5)
Lymphs Abs: 1778 cells/uL (ref 850–3900)
MCH: 30.3 pg (ref 27.0–33.0)
MCHC: 33 g/dL (ref 32.0–36.0)
MCV: 91.8 fL (ref 80.0–100.0)
MPV: 10.1 fL (ref 7.5–12.5)
Monocytes Relative: 11.9 %
Neutro Abs: 2444 cells/uL (ref 1500–7800)
Neutrophils Relative %: 47 %
Platelets: 304 10*3/uL (ref 140–400)
RBC: 4.29 10*6/uL (ref 3.80–5.10)
RDW: 12.7 % (ref 11.0–15.0)
Total Lymphocyte: 34.2 %
WBC: 5.2 10*3/uL (ref 3.8–10.8)

## 2020-10-16 LAB — COMPLETE METABOLIC PANEL WITHOUT GFR
AG Ratio: 1.3 (calc) (ref 1.0–2.5)
ALT: 27 U/L (ref 6–29)
AST: 20 U/L (ref 10–35)
Albumin: 3.6 g/dL (ref 3.6–5.1)
Alkaline phosphatase (APISO): 68 U/L (ref 37–153)
BUN/Creatinine Ratio: 15 (calc) (ref 6–22)
BUN: 14 mg/dL (ref 7–25)
CO2: 32 mmol/L (ref 20–32)
Calcium: 8.9 mg/dL (ref 8.6–10.4)
Chloride: 104 mmol/L (ref 98–110)
Creat: 0.94 mg/dL — ABNORMAL HIGH (ref 0.60–0.93)
GFR, Est African American: 71 mL/min/{1.73_m2}
GFR, Est Non African American: 61 mL/min/{1.73_m2}
Globulin: 2.8 g/dL (ref 1.9–3.7)
Glucose, Bld: 96 mg/dL (ref 65–99)
Potassium: 4.2 mmol/L (ref 3.5–5.3)
Sodium: 139 mmol/L (ref 135–146)
Total Bilirubin: 0.4 mg/dL (ref 0.2–1.2)
Total Protein: 6.4 g/dL (ref 6.1–8.1)

## 2020-10-16 LAB — LIPID PANEL
Cholesterol: 130 mg/dL
HDL: 42 mg/dL — ABNORMAL LOW
LDL Cholesterol (Calc): 72 mg/dL
Non-HDL Cholesterol (Calc): 88 mg/dL
Total CHOL/HDL Ratio: 3.1 (calc)
Triglycerides: 84 mg/dL

## 2020-10-16 LAB — VITAMIN B12: Vitamin B-12: 303 pg/mL (ref 200–1100)

## 2020-10-16 LAB — TSH: TSH: 0.01 m[IU]/L — ABNORMAL LOW (ref 0.40–4.50)

## 2020-10-16 LAB — VITAMIN D 25 HYDROXY (VIT D DEFICIENCY, FRACTURES): Vit D, 25-Hydroxy: 41 ng/mL (ref 30–100)

## 2020-10-17 ENCOUNTER — Ambulatory Visit: Payer: Medicare Other | Admitting: Family Medicine

## 2021-01-02 DIAGNOSIS — G3184 Mild cognitive impairment, so stated: Secondary | ICD-10-CM | POA: Diagnosis not present

## 2021-01-14 ENCOUNTER — Encounter: Payer: Self-pay | Admitting: Family Medicine

## 2021-01-14 ENCOUNTER — Ambulatory Visit (INDEPENDENT_AMBULATORY_CARE_PROVIDER_SITE_OTHER): Payer: Medicare Other | Admitting: Family Medicine

## 2021-01-14 ENCOUNTER — Other Ambulatory Visit: Payer: Self-pay

## 2021-01-14 VITALS — BP 130/86 | HR 83 | Temp 97.6°F | Resp 16 | Ht 66.0 in | Wt 195.0 lb

## 2021-01-14 DIAGNOSIS — E559 Vitamin D deficiency, unspecified: Secondary | ICD-10-CM

## 2021-01-14 DIAGNOSIS — E7800 Pure hypercholesterolemia, unspecified: Secondary | ICD-10-CM | POA: Diagnosis not present

## 2021-01-14 DIAGNOSIS — E890 Postprocedural hypothyroidism: Secondary | ICD-10-CM

## 2021-01-14 DIAGNOSIS — F5101 Primary insomnia: Secondary | ICD-10-CM

## 2021-01-14 DIAGNOSIS — R432 Parageusia: Secondary | ICD-10-CM

## 2021-01-14 DIAGNOSIS — S76312A Strain of muscle, fascia and tendon of the posterior muscle group at thigh level, left thigh, initial encounter: Secondary | ICD-10-CM

## 2021-01-14 DIAGNOSIS — M170 Bilateral primary osteoarthritis of knee: Secondary | ICD-10-CM

## 2021-01-14 DIAGNOSIS — R053 Chronic cough: Secondary | ICD-10-CM

## 2021-01-14 DIAGNOSIS — U099 Post covid-19 condition, unspecified: Secondary | ICD-10-CM

## 2021-01-14 DIAGNOSIS — F320 Major depressive disorder, single episode, mild: Secondary | ICD-10-CM

## 2021-01-14 DIAGNOSIS — R439 Unspecified disturbances of smell and taste: Secondary | ICD-10-CM

## 2021-01-14 NOTE — Progress Notes (Addendum)
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 Celine Ahr 04/12/2020 . She has a thyroidectomy 06/18/2020 , last TSH done by endo 09/04/2020 was suppressed. She has regular follow ups. No diarrhea, dysphagia, she has hair loss   Chronic cough: seen by Dr. Patsey Berthold recently, she is on zyrtec at night and states cough not as severe now since thyroidectomy but worse at night , it is slightly productive She was given zyrtec at night but she states it did not improve symptoms. Advised her to follow up with pulmonologist since still having symptoms   Hyperlipidemia: taking Atorvastatin, reviewed last labs , LDL was 72 , HDL was 42 reminded her to eat more fish and continue  tree nuts daily    OA both knees:stable, taking tylenol prn now, no effusion or redness. 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 She still feels it is affecting her memory . She states her husband and girlfriends helps her with directions now. She saw Dr/ Manuella Ghazi and was diagnosed with  Mild cognitive dysfunction , she is now on Namenda 5 mg once daily  B12 deficiency: she is taking SL B12 daily   Major Depression: She is on her third marriage, first marriage was not good, her twins were 7 when he left them, second marriage was good but he died when she was 70 yo, she is on 39 rd marriage and together for the past few years, they met during a high school reunion. She continues  to feel upset about her memory, she sates her children don't understand it Husband is supportive   Left hamstring strain: started one week ago,  she bent forward to pick up laundry from the floor and pulling sensation and pain on left hamstring all the way up to buttocks. Pain is not constant, and is getting gradually better and only aggravated by certain positions.   Patient Active Problem List   Diagnosis Date Noted  . 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 Pure hypercholesterolemia 10/15/2020  . Stage 3a chronic kidney disease ([place]) 10/15/2020  . Memory changes 10/15/2020  . S/P total thyroidectomy 06/18/2020  . Class 1 obesity in adult 11/11/2016  . Osteopenia 10/09/2016  . Special screening for malignant neoplasms, colon   . Rectal polyp   . Right wrist tendonitis 05/16/2016  . Osteoarthritis of left hip 05/16/2016  . Vitamin D deficiency 02/05/2016  . Degeneration of intervertebral disc of cervical region 02/05/2016  . Insomnia 04/23/2015  . Major depression in remission ([place]) 04/23/2015  . Chronic pain 04/23/2015  . Goiter, nontoxic, 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  . 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 Uterine leiomyoma 06/16/2007    Past Surgical History:  Procedure Laterality Date  . ABDOMINAL HYSTERECTOMY    . BILATERAL OOPHORECTOMY    . COLONOSCOPY WITH PROPOFOL N/A 06/02/2016   Procedure: COLONOSCOPY WITH PROPOFOL;  Surgeon: Lucilla Lame, MD;  Location: [place];  Service: Endoscopy;  Laterality: N/A;  . dequavian tendonitis Left   . KNEE ARTHROSCOPY Bilateral   . POLYPECTOMY  06/02/2016   Procedure: POLYPECTOMY;  Surgeon: Lucilla Lame, MD;  Location: [place];  Service: Endoscopy;;  . THYROIDECTOMY N/A 06/18/2020   Procedure: THYROIDECTOMY, total;  Surgeon: Fredirick Maudlin, MD;  Location: [facility];  Service: General;  Laterality: N/A;  . TONSILLECTOMY     AGE 24 OR 11    Family History  Problem Relation Age of  Onset  . Diabetes Mother   . Heart disease Mother   . Emphysema Father   . Heart disease Father   . COPD Father   . Multiple myeloma Brother   . Alzheimer's disease Sister   . Dementia Sister     Social History   Tobacco Use  . Smoking status: Never Smoker  . Smokeless tobacco: Never Used  Substance Use Topics  . Alcohol use: No    Alcohol/week: 0.0 standard drinks     Current Outpatient Medications:  .  acetaminophen (TYLENOL) 500 MG tablet, Take 500 mg by mouth every 6 (six) hours as needed., Disp: , Rfl:  .  aspirin 81 MG tablet, Take 81 mg by mouth daily., Disp: , Rfl:  .  atorvastatin (LIPITOR) 40 MG tablet, Take 1 tablet (40 mg total) by mouth at bedtime., Disp: 90 tablet, Rfl: 1 .  Biotin w/ Vitamins C & E (HAIR SKIN & NAILS GUMMIES PO), Take 2 tablets by mouth every other day., Disp: , Rfl:  .  Calcium Carbonate (CALCIUM 600 PO), Take 600 mg by mouth in the morning and at bedtime., Disp: , Rfl:  .  Cholecalciferol (VITAMIN D) 50 MCG (2000 UT) CAPS, Take 2,000 Units by mouth daily., Disp: , Rfl:  .  DULoxetine (CYMBALTA) 60 MG capsule, Take 1 capsule (60 mg total) by mouth every morning., Disp: 90 capsule, Rfl: 1 .  estradiol (ESTRACE) 0.5 MG tablet, Take 1 tablet (0.5 mg total) by mouth daily., Disp: 90 tablet, Rfl: 1 .  memantine (NAMENDA) 5 MG tablet, Take by mouth., Disp: , Rfl:  .  traZODone (DESYREL) 50 MG tablet, Take 0.5-1 tablets (25-50 mg total) by mouth at bedtime as needed for sleep., Disp: 90 tablet, Rfl: 0 .  levothyroxine (SYNTHROID) 137 MCG tablet, Take 1 tablet (137 mcg total) by mouth daily at 6 (six) AM., Disp: 30 tablet, Rfl: 3  Allergies  Allergen Reactions  . Sulfamethoxazole-Trimethoprim Other (See Comments) and Rash  . Citalopram Itching  . Tetracycline Other (See Comments)    Other Reaction: URTICARIA  . Tetracyclines & Related Itching  . Codeine Itching    I personally reviewed active problem list, medication list, allergies, family  history, social history, health maintenance with the patient/caregiver today.   ROS  Constitutional: Negative for fever or weight change.  Respiratory: positive  for cough but no shortness of breath.   Cardiovascular: Negative for chest pain or palpitations.  Gastrointestinal: Negative for abdominal pain, no bowel changes.  Musculoskeletal: Negative for gait problem or joint swelling.  Skin: Negative for rash.  Neurological: Negative for dizziness or headache.  No other specific complaints in a complete review of systems (except as listed in HPI above).  Objective  Vitals:   01/14/21 1049  BP: 130/86  Pulse: 83  Resp: 16  Temp: 97.6 F (36.4 C)  TempSrc: Oral  SpO2: 97%  Weight: 195 lb (88.5 kg)  Height: 5'  6" (1.676 m)    Body mass index is 31.47 kg/m.  Physical Exam  Constitutional: Patient appears well-developed and well-nourished. Obese  No distress.  HEENT: head atraumatic, normocephalic, pupils equal and reactive to light,  neck supple Cardiovascular: Normal rate, regular rhythm and normal heart sounds.  No murmur heard. No BLE edema. Pulmonary/Chest: Effort normal and breath sounds normal. No respiratory distress. Abdominal: Soft.  There is no tenderness. Muscular Skeletal: pain during palpation of left hamstring with small knot mid area, normal gait  Psychiatric: Patient has a normal mood and affect. behavior is normal. Judgment and thought content normal.  PHQ2/9: Depression screen [facility] 2/9 01/14/2021 10/15/2020 08/02/2020 04/16/2020 11/14/2019  Decreased Interest 1 1 0 0 0  Down, Depressed, Hopeless 0 0 0 0 0  PHQ - 2 Score 1 1 0 0 0  Altered sleeping 1 1 - 0 0  Tired, decreased energy 1 1 - 0 0  Change in appetite 0 0 - 0 0  Feeling bad or failure about yourself  0 0 - 0 0  Trouble concentrating 0 0 - 0 0  Moving slowly or fidgety/restless 0 0 - 0 0  Suicidal thoughts 0 0 - 0 0  PHQ-9 Score 3 3 - 0 0  Difficult doing work/chores - Not difficult at all - - -   Some recent data might be hidden    phq 9 is positive   Fall Risk: Fall Risk  01/14/2021 10/15/2020 08/02/2020 04/16/2020 04/12/2020  Falls in the past year? 0 0 0 0 0  Number falls in past yr: 0 0 0 0 0  Injury with Fall? 0 0 0 0 0  Risk for fall due to : - - No Fall Risks - -  Follow up - - Falls prevention discussed - -     Functional Status Survey: Is the patient deaf or have difficulty hearing?: No Does the patient have difficulty seeing, even when wearing glasses/contacts?: No Does the patient have difficulty concentrating, remembering, or making decisions?: No Does the patient have difficulty walking or climbing stairs?: No Does the patient have difficulty dressing or bathing?: No Does the patient have difficulty doing errands alone such as visiting a doctor's office or shopping?: No    Assessment & Plan  1. History of thyroidectomy  Under the care of Dr. Honor Junes    2. Hyperlipidemia  Continue medication  3. Primary insomnia  Doing well on medication  4. Vitamin D deficiency  Continue supplementation   5. Mild major depression (HCC)  stable  6. Chronic cough  Needs to follow up with pulmonolgist   7. Hypothyroidism, postablative   8. Primary osteoarthritis of both knees   9. 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 Strain of left hamstring, initial encounter  Gave information about home exercises

## 2021-02-07 DIAGNOSIS — Z8616 Personal history of COVID-19: Secondary | ICD-10-CM | POA: Diagnosis not present

## 2021-02-07 DIAGNOSIS — G3184 Mild cognitive impairment, so stated: Secondary | ICD-10-CM | POA: Diagnosis not present

## 2021-02-11 ENCOUNTER — Other Ambulatory Visit: Payer: Self-pay | Admitting: Student

## 2021-02-11 ENCOUNTER — Other Ambulatory Visit (HOSPITAL_COMMUNITY): Payer: Self-pay | Admitting: Student

## 2021-02-11 DIAGNOSIS — G3184 Mild cognitive impairment, so stated: Secondary | ICD-10-CM

## 2021-02-18 ENCOUNTER — Other Ambulatory Visit: Payer: Self-pay | Admitting: Family Medicine

## 2021-02-18 ENCOUNTER — Ambulatory Visit (HOSPITAL_COMMUNITY)
Admission: RE | Admit: 2021-02-18 | Discharge: 2021-02-18 | Disposition: A | Discharge status: Home / Self Care | Payer: Medicare Other | Source: Ambulatory Visit | Attending: Student | Admitting: Student

## 2021-02-18 ENCOUNTER — Other Ambulatory Visit: Payer: Self-pay

## 2021-02-18 DIAGNOSIS — F5101 Primary insomnia: Secondary | ICD-10-CM

## 2021-02-18 DIAGNOSIS — Z780 Asymptomatic menopausal state: Secondary | ICD-10-CM

## 2021-02-18 DIAGNOSIS — G3184 Mild cognitive impairment, so stated: Secondary | ICD-10-CM | POA: Diagnosis not present

## 2021-02-18 MED ORDER — ESTRADIOL 0.5 MG PO TABS: 0.5000 mg | ORAL_TABLET | Freq: Every day | ORAL | 1 refills | Status: DC

## 2021-02-18 MED ORDER — TRAZODONE HCL 50 MG PO TABS: 25.0000 mg | ORAL_TABLET | Freq: Every evening | ORAL | 0 refills | Status: DC | PRN

## 2021-02-18 NOTE — Telephone Encounter (Signed)
Medication Refill - Medication:   traZODone (DESYREL) 50 MG tablet  estradiol (ESTRACE) 0.5 MG tablet   Has the patient contacted their pharmacy? Yes.  No refills left, contact office. Pt stated she is running low on both meds.    Preferred Pharmacy (with phone number or street name):    [facility], [address]  [place] [address]  Phone: [phone] Fax: [phone]    Agent: Please be advised that RX refills may take up to 3 business days. We ask that you follow-up with your pharmacy.

## 2021-02-21 DIAGNOSIS — H25013 Cortical age-related cataract, bilateral: Secondary | ICD-10-CM | POA: Diagnosis not present

## 2021-02-25 DIAGNOSIS — E890 Postprocedural hypothyroidism: Secondary | ICD-10-CM | POA: Diagnosis not present

## 2021-02-28 DIAGNOSIS — E890 Postprocedural hypothyroidism: Secondary | ICD-10-CM | POA: Diagnosis not present

## 2021-03-17 IMAGING — CT CT CHEST W/ CM
2 of 4 series · 15 of 36 positions shown, 18 images · IV contrast (omnipaque)
Comparison: Radiography 05/04/2018

CLINICAL DATA: Persistent cough. Assess for bronchiectasis.
Congestion with mucus production. Two years duration.

EXAM:
CT CHEST WITH CONTRAST
TECHNIQUE: Multidetector CT imaging of the chest was performed during
intravenous contrast administration.
CONTRAST:  75mL OMNIPAQUE IOHEXOL 300 MG/ML  SOLN

[Series 2: axial chest 2.00 · axial · 0.69mm/px · z∈[-1179,-915]mm · 12 of 157 slices shown, 15 images]
[im 13/157  mediastinal]
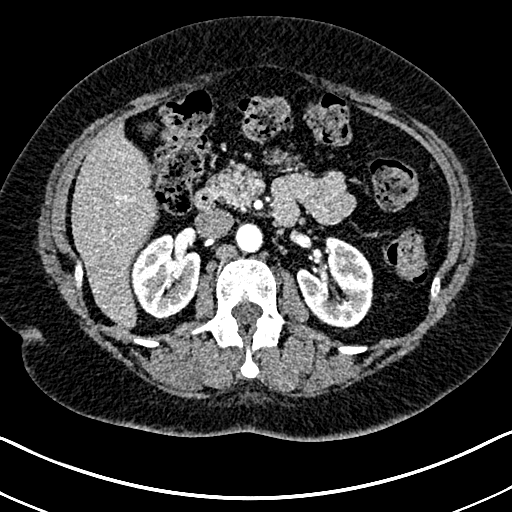
[im 13/157  lung]
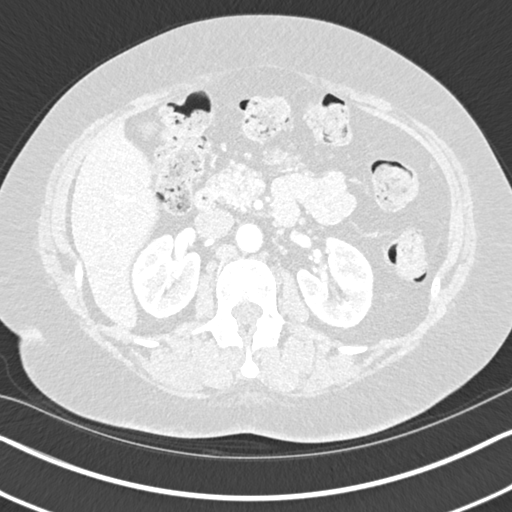
[im 25/157  lung]
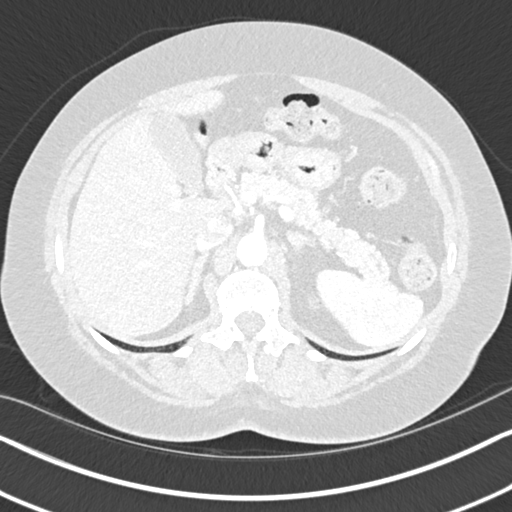
[im 37/157  lung]
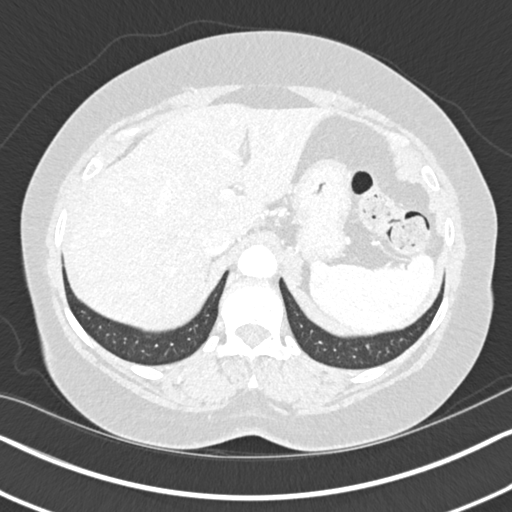
[im 49/157  lung]
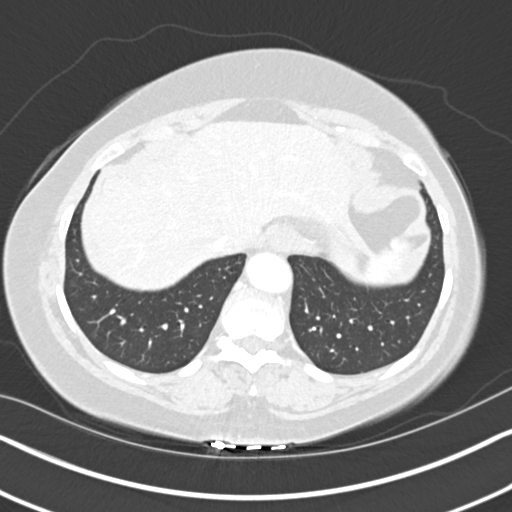
[im 61/157  mediastinal]
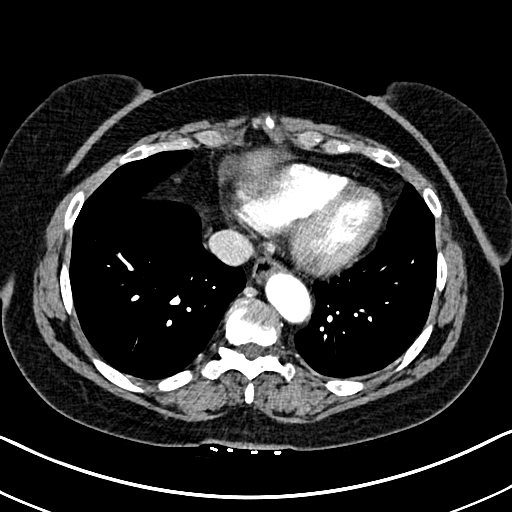
[im 61/157  lung]
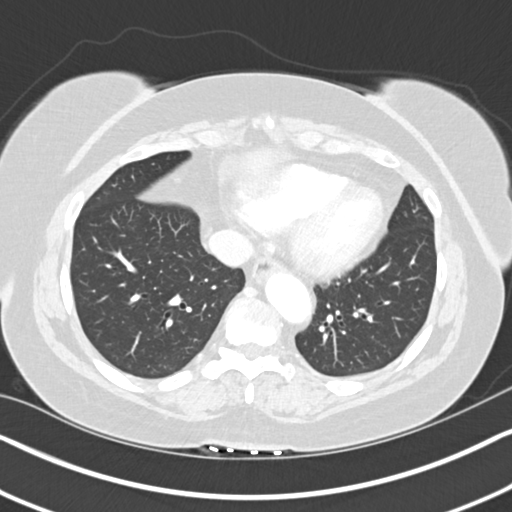
[im 73/157  lung]
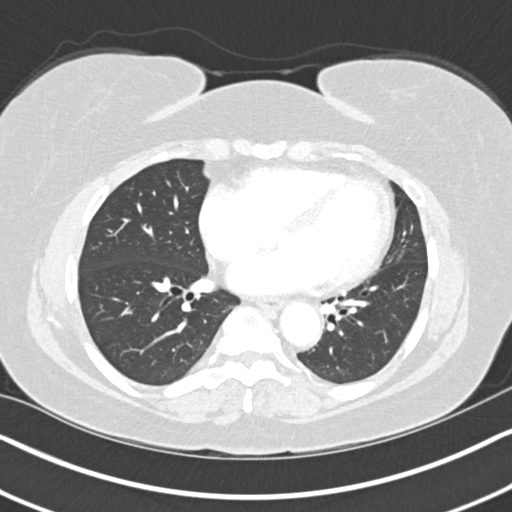
[im 85/157  lung]
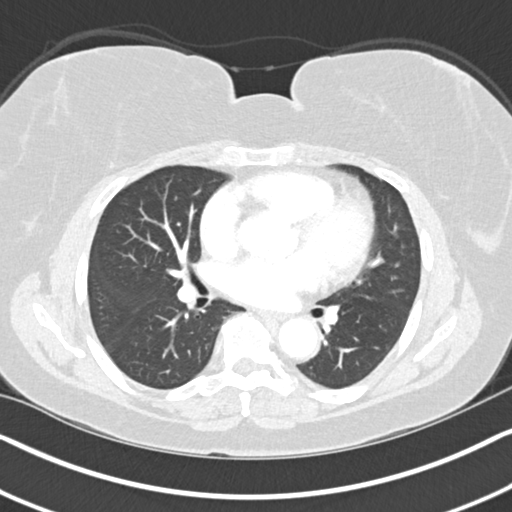
[im 97/157  lung]
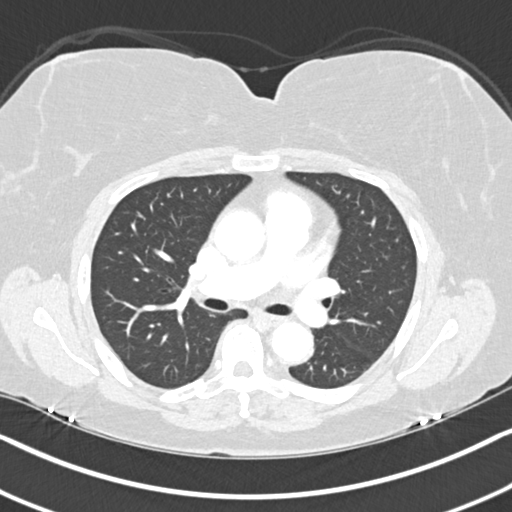
[im 109/157  mediastinal]
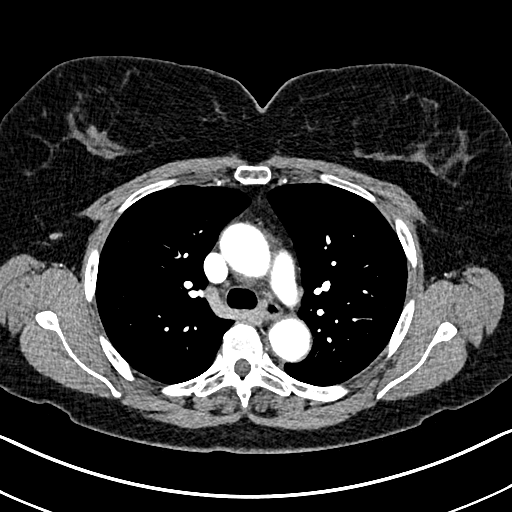
[im 109/157  lung]
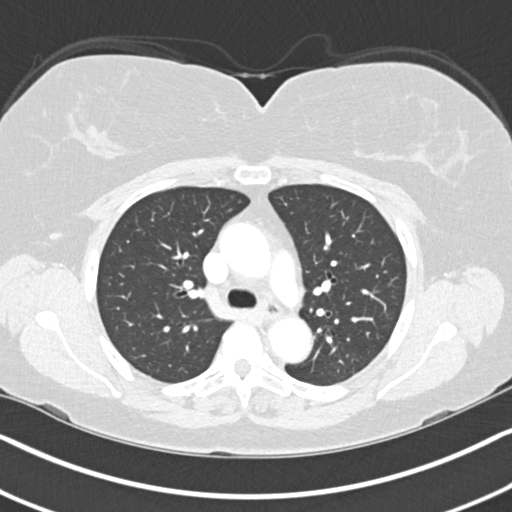
[im 121/157  lung]
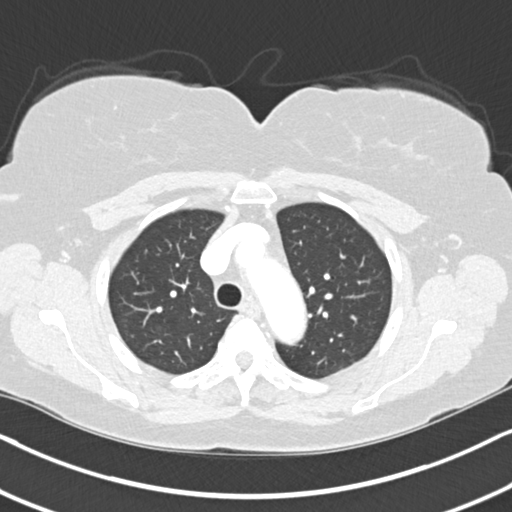
[im 133/157  lung]
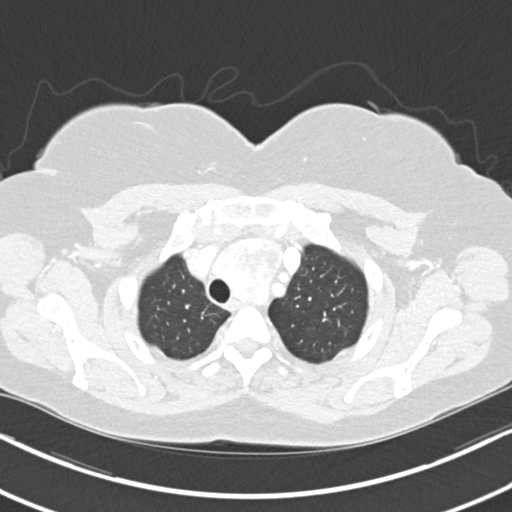
[im 145/157  lung]
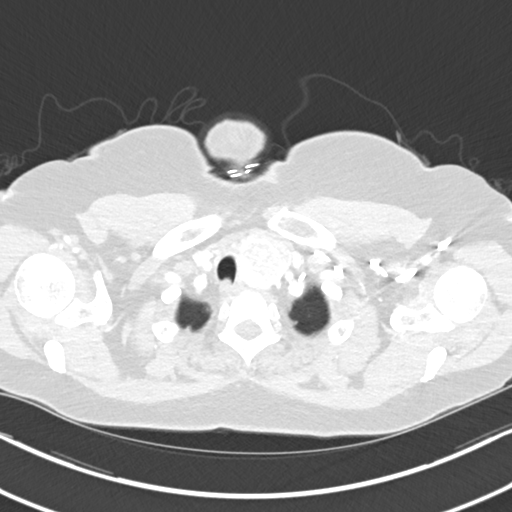

[Series 4: coronal chest 2.00 cor · coronal · 0.62mm/px · 3 of 149 slices shown]
[im 30/149  lung]
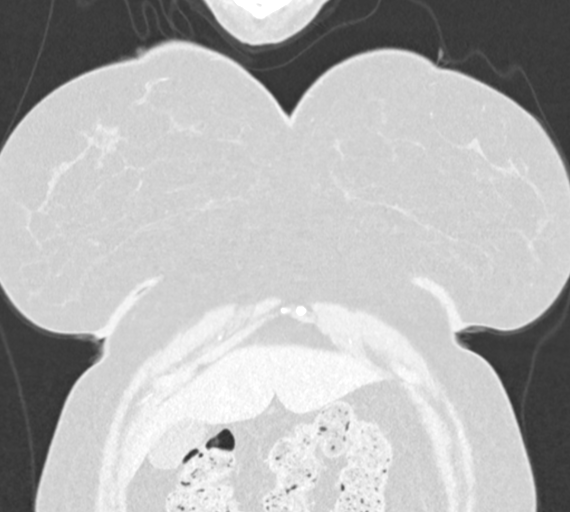
[im 60/149  lung]
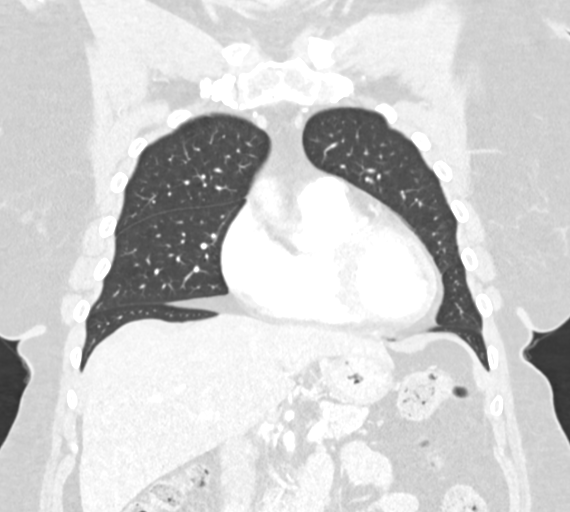
[im 89/149  lung]
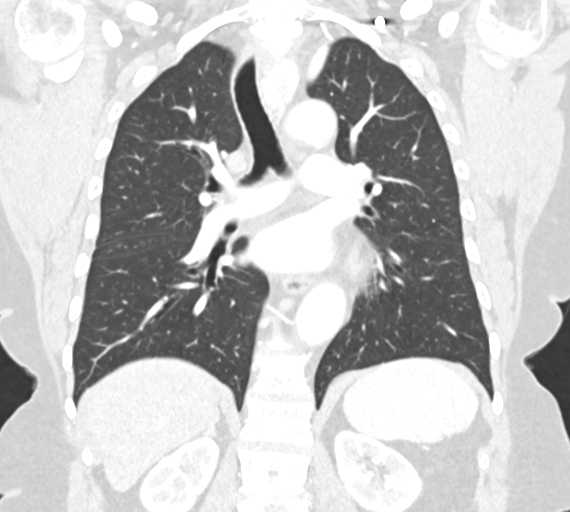

[15 of 36 positions shown; findings below may reference images not displayed]

FINDINGS: Cardiovascular: Heart size is normal. No pericardial fluid. No
coronary artery calcification. Very minimal aortic atherosclerotic
calcification at the arch. No aneurysm or dissection.

Mediastinum/Nodes: Substernal extension of a thyroid mass emanating
from the left lobe. Right lobe of the thyroid appears normal. This
thyroid nodule/mass measures 6.7 cm in length with a transverse
dimension of 4.1 x 4.9 cm. No regional adenopathy.

Lungs/Pleura: Upper lungs are clear. No emphysematous change. Lower
lungs are clear without evidence of bronchiectasis, infiltrate,
collapse or effusion. Single 7 mm bleb noted in the right middle
lobe, not likely significant as an isolated finding. No inflammatory
characteristics of this. Laterally in the right upper lobe, there is
a 5 mm subpleural ground-glass nodule. No other focal finding.

Upper Abdomen: Normal

Musculoskeletal: Ordinary thoracic spinal degenerative changes.
IMPRESSION: No evidence of bronchiectasis, collapse or infiltrate.

Single 7 mm bleb in the right middle lobe without inflammatory
change. Single 5 mm focus of ground-glass nodularity laterally in
the right upper lobe axial image 47. No follow-up recommended. This
recommendation follows the consensus statement: Guidelines for
Management of Incidental Pulmonary Nodules Detected on CT Images:

Substernal/superior mediastinal extension of a thyroid mass/nodule
measuring 6.7 x 4.1 x 4.9 cm. This likely represents benign goiter.
Recommend thyroid US (ref: [HOSPITAL]. [DATE]):

## 2021-04-15 DIAGNOSIS — H25013 Cortical age-related cataract, bilateral: Secondary | ICD-10-CM | POA: Diagnosis not present

## 2021-04-15 DIAGNOSIS — H2513 Age-related nuclear cataract, bilateral: Secondary | ICD-10-CM | POA: Diagnosis not present

## 2021-04-15 DIAGNOSIS — H25043 Posterior subcapsular polar age-related cataract, bilateral: Secondary | ICD-10-CM | POA: Diagnosis not present

## 2021-04-15 DIAGNOSIS — H2511 Age-related nuclear cataract, right eye: Secondary | ICD-10-CM | POA: Diagnosis not present

## 2021-04-16 NOTE — Progress Notes (Signed)
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 Celine Ahr 04/12/2020 . She has a thyroidectomy 06/18/2020 , last TSH done by endo 01/2021 was suppressed, and dose was adjusted from 137 mcg daily to 112 mcg daily . She has regular follow ups. No diarrhea, dysphagia but has noticed some fatigue recently.   Chronic cough: seen by Dr. Patsey Berthold . She had thyroidectomy and symptoms during the day improved, however continues to wake up during the night with productive cough. Never smoked but exposed to second hand smoking . Negative PFT's. I will refer her to ENT   Hyperlipidemia: taking Atorvastatin , reviewed last labs , LDL was 72 , HDL was 42 reminded her to eat more fish and continue  tree nuts daily  We will recheck yearly   OA both knees: stable, taking tylenol prn now, no effusion or redness. 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 She still feels it is affecting her memory . She states her husband and girlfriends helps her with directions now. She saw Dr/ Manuella Ghazi and was diagnosed with  Mild cognitive dysfunction , she is now on Namenda 5 mg once daily  B12 deficiency: she is taking SL B12 daily , recheck next visit   Major Depression: She is on her third marriage, first marriage was not good, her twins were 7 when he left them, second marriage was good but he died when she was 70 yo, she is on 73 [address] marriage and together for the past few years, they met  during a high school reunion. She continues to feel upset about her memory, she sates her children don't understand it Husband is supportive . She is taking Duloxetein. 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 She states she had to use GPS to get here today. She is still making herself get out of the house but feels safer at home . Seeing neuorlogist/Dr. 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 Manuella Ghazi, only able to tolerate Namenda 5 mg at night. 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 10/15/2020   Pure hypercholesterolemia 10/15/2020   Stage 3a chronic kidney disease ([place]) 10/15/2020   Memory changes 10/15/2020   S/P total thyroidectomy 06/18/2020   Class 1 obesity in adult 11/11/2016   Osteopenia 10/09/2016   Special screening for malignant neoplasms, colon    Rectal polyp    Right wrist tendonitis 05/16/2016   Osteoarthritis of left hip 05/16/2016   Vitamin D deficiency 02/05/2016   Degeneration of intervertebral disc of cervical region 02/05/2016   Insomnia 04/23/2015  Major depression in remission ([place]) 04/23/2015   Chronic pain 04/23/2015   Goiter, nontoxic, 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   Hypothyroidism, 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   Uterine leiomyoma 06/16/2007    Past Surgical History:  Procedure Laterality Date   ABDOMINAL HYSTERECTOMY     BILATERAL OOPHORECTOMY     COLONOSCOPY WITH PROPOFOL N/A 06/02/2016   Procedure: COLONOSCOPY WITH PROPOFOL;  Surgeon: Lucilla Lame, MD;  Location: [facility];  Service: Endoscopy;  Laterality: N/A;   dequavian tendonitis Left    KNEE ARTHROSCOPY Bilateral    POLYPECTOMY  06/02/2016   Procedure: POLYPECTOMY;  Surgeon: Lucilla Lame, MD;  Location: [facility];  Service: Endoscopy;;   THYROIDECTOMY N/A 06/18/2020   Procedure: THYROIDECTOMY, total;  Surgeon: Fredirick Maudlin, MD;  Location: [facility];  Service: General;  Laterality: N/A;   TONSILLECTOMY     AGE 35 OR 55    Family History  Problem Relation Age of Onset   Diabetes Mother    Heart disease Mother    Emphysema Father    Heart disease Father    COPD Father    Multiple myeloma Brother    Alzheimer's disease Sister    Dementia Sister     Social History   Tobacco Use   Smoking status: Never   Smokeless tobacco: Never  Substance Use Topics   Alcohol use: No    Alcohol/week: 0.0 standard drinks     Current Outpatient Medications:    acetaminophen (TYLENOL) 500 MG tablet, Take 500 mg by mouth every 6 (six) hours as needed., Disp: , Rfl:    aspirin 81 MG tablet, Take 81 mg by mouth daily., Disp: , Rfl:    atorvastatin (LIPITOR) 40 MG tablet, Take 1 tablet (40 mg total) by mouth at bedtime., Disp: 90 tablet, Rfl: 1   Biotin w/ Vitamins C & E (HAIR SKIN & NAILS GUMMIES PO), Take 2 tablets by mouth every other day., Disp: , Rfl:    Calcium Carbonate (CALCIUM 600 PO), Take 600 mg by mouth in the morning and at bedtime., Disp: , Rfl:    Cholecalciferol (VITAMIN D) 50 MCG (2000 UT) CAPS, Take 2,000 Units by mouth daily., Disp: , Rfl:    DULoxetine (CYMBALTA) 60 MG capsule, Take 1 capsule (60 mg total) by mouth every morning., Disp: 90 capsule, Rfl: 1   estradiol (ESTRACE) 0.5 MG tablet, Take 1 tablet (0.5 mg total) by mouth daily., Disp: 90 tablet, Rfl: 1   levothyroxine (SYNTHROID) 112 MCG tablet, Take by mouth., Disp: , Rfl:    memantine (NAMENDA) 5 MG tablet, Take 1 tablet by mouth daily., Disp: , Rfl:    traZODone (DESYREL) 50 MG tablet, Take 0.5-1 tablets (25-50 mg total) by mouth at bedtime as needed for sleep., Disp: 90 tablet, Rfl: 0  Allergies  Allergen Reactions   Sulfamethoxazole-Trimethoprim Other (See Comments) and Rash    Citalopram Itching   Tetracycline Other (See Comments)    Other Reaction: URTICARIA   Tetracyclines & Related Itching   Codeine Itching    I personally reviewed active problem list, medication list, allergies, family history, social history, health maintenance with the patient/caregiver today.   ROS  Ten systems reviewed and is negative except as mentioned in HPI   Objective  Vitals:   04/18/21 0947  BP: 128/84  Pulse: 75  Resp: 16  Temp: 97.6 F (36.4 C)  SpO2: 97%  Weight: 198 lb (89.8 kg)  Height: '5\' 6"'  (1.676 m)    Body mass index is 31.96 kg/m.  Physical Exam  Constitutional: Patient appears well-developed and well-nourished. Obese  No distress.  HEENT: head atraumatic, normocephalic, pupils equal and reactive to light, neck supple Cardiovascular: Normal rate, regular rhythm and normal heart sounds.  No murmur heard. No BLE edema. Pulmonary/Chest: Effort normal and breath sounds normal. No respiratory distress. Abdominal: Soft.  There is no tenderness. Psychiatric: Patient has a normal mood and affect. behavior is normal. Judgment and thought content normal.  Skin: small area of excoriation of left anterior chest wall   PHQ2/9: Depression screen [facility] 2/9 04/18/2021 01/14/2021 10/15/2020 08/02/2020 04/16/2020  Decreased Interest '3 1 1 ' 0 0  Down, Depressed, Hopeless 3 0 0 0 0  PHQ - 2 Score '6 1 1 ' 0 0  Altered sleeping '3 1 1 ' - 0  Tired, decreased energy '1 1 1 ' - 0  Change in appetite 0 0 0 - 0  Feeling bad or failure about yourself  0 0 0 - 0  Trouble concentrating 0 0 0 - 0  Moving slowly or fidgety/restless 0 0 0 - 0  Suicidal thoughts 0 0 0 - 0  PHQ-9 Score '10 3 3 ' - 0  Difficult doing work/chores - - Not difficult at all - -  Some recent data might be hidden    phq 9 is positive   Fall Risk: Fall Risk  04/18/2021 01/14/2021 10/15/2020 08/02/2020 04/16/2020  Falls in the past year? 0 0 0 0 0  Number falls in past yr: 0 0 0 0 0  Injury with Fall? 0 0 0 0 0   Risk for fall due to : - - - No Fall Risks -  Follow up - - - Falls prevention discussed -      Functional Status Survey: Is the patient deaf or have difficulty hearing?: No Does the patient have difficulty seeing, even when wearing glasses/contacts?: No Does the patient have difficulty concentrating, remembering, or making decisions?: No Does the patient have difficulty walking or climbing stairs?: No Does the patient have difficulty dressing or bathing?: No Does the patient have difficulty doing errands alone such as visiting a doctor's office or shopping?: No    Assessment & Plan  1. Moderate episode of recurrent major depressive disorder ([place])   2. Mild cognitive impairment   3. Hypothyroidism, postablative   4. Vitamin D deficiency   5.History of thyroidectomy   6. Pure hypercholesterolemia  - atorvastatin (LIPITOR) 40 MG tablet; Take 1 tablet (40 mg total) by mouth at bedtime.  Dispense: 90 tablet; Refill: 1  7. Primary insomnia  - traZODone (DESYREL) 50 MG tablet; Take 0.5-1 tablets (25-50 mg total) by mouth at bedtime as needed for sleep.  Dispense: 90 tablet; Refill: 1  8. Chronic cough  - Ambulatory referral to ENT - benzonatate (TESSALON) 200 MG capsule; Take 1 capsule (200 mg total) by mouth at bedtime.  Dispense: 90 capsule; Refill: 1  9. 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 Need for shingles vaccine  - Zoster Vaccine Adjuvanted [facility]) injection; Inject 0.5 mLs into the muscle once for 1 dose.  Dispense: 0.5 mL; Refill: 1  11. Need for Tdap vaccination  - Tdap vaccine greater than or equal to 7yo IM  12. Skin excoriation  - Tdap vaccine greater than or equal to 7yo IM   13. Colon cancer screening  - Ambulatory referral to Gastroenterology

## 2021-04-18 ENCOUNTER — Encounter: Payer: Self-pay | Admitting: Family Medicine

## 2021-04-18 ENCOUNTER — Ambulatory Visit (INDEPENDENT_AMBULATORY_CARE_PROVIDER_SITE_OTHER): Payer: Medicare Other | Admitting: Family Medicine

## 2021-04-18 ENCOUNTER — Other Ambulatory Visit: Payer: Self-pay

## 2021-04-18 VITALS — BP 128/84 | HR 75 | Temp 97.6°F | Resp 16 | Ht 66.0 in | Wt 198.0 lb

## 2021-04-18 DIAGNOSIS — F331 Major depressive disorder, recurrent, moderate: Secondary | ICD-10-CM | POA: Diagnosis not present

## 2021-04-18 DIAGNOSIS — E559 Vitamin D deficiency, unspecified: Secondary | ICD-10-CM

## 2021-04-18 DIAGNOSIS — Z1211 Encounter for screening for malignant neoplasm of colon: Secondary | ICD-10-CM

## 2021-04-18 DIAGNOSIS — Z9089 Acquired absence of other organs: Secondary | ICD-10-CM

## 2021-04-18 DIAGNOSIS — G3184 Mild cognitive impairment, so stated: Secondary | ICD-10-CM | POA: Diagnosis not present

## 2021-04-18 DIAGNOSIS — Z23 Encounter for immunization: Secondary | ICD-10-CM

## 2021-04-18 DIAGNOSIS — T148XXA Other injury of unspecified body region, initial encounter: Secondary | ICD-10-CM

## 2021-04-18 DIAGNOSIS — U099 Post covid-19 condition, unspecified: Secondary | ICD-10-CM

## 2021-04-18 DIAGNOSIS — R053 Chronic cough: Secondary | ICD-10-CM

## 2021-04-18 DIAGNOSIS — R439 Unspecified disturbances of smell and taste: Secondary | ICD-10-CM

## 2021-04-18 DIAGNOSIS — E890 Postprocedural hypothyroidism: Secondary | ICD-10-CM | POA: Diagnosis not present

## 2021-04-18 DIAGNOSIS — F5101 Primary insomnia: Secondary | ICD-10-CM

## 2021-04-18 DIAGNOSIS — E7800 Pure hypercholesterolemia, unspecified: Secondary | ICD-10-CM

## 2021-04-18 MED ORDER — TRAZODONE HCL 50 MG PO TABS: 25.0000 mg | ORAL_TABLET | Freq: Every evening | ORAL | 1 refills | Status: DC | PRN

## 2021-04-18 MED ORDER — BENZONATATE 200 MG PO CAPS: 200.0000 mg | ORAL_CAPSULE | Freq: Every evening | ORAL | 1 refills | Status: DC

## 2021-04-18 MED ORDER — DULOXETINE HCL 60 MG PO CPEP: 60.0000 mg | ORAL_CAPSULE | ORAL | 1 refills | Status: DC

## 2021-04-18 MED ORDER — ATORVASTATIN CALCIUM 40 MG PO TABS: 40.0000 mg | ORAL_TABLET | Freq: Every day | ORAL | 1 refills | Status: DC

## 2021-04-18 MED ORDER — SHINGRIX 50 MCG/0.5ML IM SUSR: 0.5000 mL | Freq: Once | INTRAMUSCULAR | 1 refills | Status: AC

## 2021-04-24 ENCOUNTER — Telehealth: Payer: Self-pay | Admitting: Gastroenterology

## 2021-04-24 ENCOUNTER — Other Ambulatory Visit: Payer: Self-pay

## 2021-04-24 DIAGNOSIS — Z86010 Personal history of colonic polyps: Secondary | ICD-10-CM

## 2021-04-24 MED ORDER — PEG 3350-KCL-NA BICARB-NACL 420 G PO SOLR: 4000.0000 mL | Freq: Once | ORAL | 0 refills | Status: AC

## 2021-04-24 NOTE — Progress Notes (Signed)
Gastroenterology Pre-Procedure Review  Request Date: 06/21/21 Requesting Physician: Dr. Allen Norris  PATIENT REVIEW QUESTIONS: The patient responded to the following health history questions as indicated:    1. Are you having any GI issues? yes (Constipation but has been taking Miralax to relieve.) 2. Do you have a personal history of Polyps? yes (06/2016) 3. Do you have a family history of Colon Cancer or Polyps? no 4. Diabetes Mellitus? no 5. Joint replacements in the past 12 months?no 6. Major health problems in the past 3 months?no 7. Any artificial heart valves, MVP, or defibrillator?no    MEDICATIONS & ALLERGIES:    Patient reports the following regarding taking any anticoagulation/antiplatelet therapy:   Plavix, Coumadin, Eliquis, Xarelto, Lovenox, Pradaxa, Brilinta, or Effient? no Aspirin? no  Patient confirms/reports the following medications:  Current Outpatient Medications  Medication Sig Dispense Refill   polyethylene glycol-electrolytes (NULYTELY) 420 g solution Take 4,000 mLs by mouth once for 1 dose. At 5pm the evening before procedure: Mix well. Drink an 8 oz glass every 20-30 minutes until completed. 4000 mL 0   acetaminophen (TYLENOL) 500 MG tablet Take 500 mg by mouth every 6 (six) hours as needed.     atorvastatin (LIPITOR) 40 MG tablet Take 1 tablet (40 mg total) by mouth at bedtime. 90 tablet 1   benzonatate (TESSALON) 200 MG capsule Take 1 capsule (200 mg total) by mouth at bedtime. 90 capsule 1   Biotin w/ Vitamins C & E (HAIR SKIN & NAILS GUMMIES PO) Take 2 tablets by mouth every other day.     Calcium Carbonate (CALCIUM 600 PO) Take 600 mg by mouth in the morning and at bedtime.     Cholecalciferol (VITAMIN D) 50 MCG (2000 UT) CAPS Take 2,000 Units by mouth daily.     DULoxetine (CYMBALTA) 60 MG capsule Take 1 capsule (60 mg total) by mouth every morning. 90 capsule 1   estradiol (ESTRACE) 0.5 MG tablet Take 1 tablet (0.5 mg total) by mouth daily. 90 tablet 1    levothyroxine (SYNTHROID) 112 MCG tablet Take by mouth.     memantine (NAMENDA) 5 MG tablet Take 1 tablet by mouth daily.     traZODone (DESYREL) 50 MG tablet Take 0.5-1 tablets (25-50 mg total) by mouth at bedtime as needed for sleep. 90 tablet 1   No current facility-administered medications for this visit.    Patient confirms/reports the following allergies:  Allergies  Allergen Reactions   Sulfamethoxazole-Trimethoprim Other (See Comments) and Rash   Citalopram Itching   Tetracycline Other (See Comments)    Other Reaction: URTICARIA   Tetracyclines & Related Itching   Codeine Itching    No orders of the defined types were placed in this encounter.   AUTHORIZATION INFORMATION Primary Insurance: 1D#: Group #:  Secondary Insurance: 1D#: Group #:  SCHEDULE INFORMATION: Date: 06/04/21 Time: Location: [place]

## 2021-04-24 NOTE — Telephone Encounter (Signed)
Returned patients call. Procedure has been scheduled for 10/21 w/ Wohl. Instructions have been sent via my chart and mailed.

## 2021-04-24 NOTE — Telephone Encounter (Signed)
Patient is ready to schedule her colonoscopy.

## 2021-05-09 DIAGNOSIS — Z961 Presence of intraocular lens: Secondary | ICD-10-CM | POA: Diagnosis not present

## 2021-05-09 DIAGNOSIS — H2511 Age-related nuclear cataract, right eye: Secondary | ICD-10-CM | POA: Diagnosis not present

## 2021-05-09 DIAGNOSIS — H52201 Unspecified astigmatism, right eye: Secondary | ICD-10-CM | POA: Diagnosis not present

## 2021-05-09 DIAGNOSIS — H25011 Cortical age-related cataract, right eye: Secondary | ICD-10-CM | POA: Diagnosis not present

## 2021-05-10 DIAGNOSIS — H2512 Age-related nuclear cataract, left eye: Secondary | ICD-10-CM | POA: Diagnosis not present

## 2021-05-23 DIAGNOSIS — H25011 Cortical age-related cataract, right eye: Secondary | ICD-10-CM | POA: Diagnosis not present

## 2021-05-23 DIAGNOSIS — Z961 Presence of intraocular lens: Secondary | ICD-10-CM | POA: Diagnosis not present

## 2021-05-23 DIAGNOSIS — H25012 Cortical age-related cataract, left eye: Secondary | ICD-10-CM | POA: Diagnosis not present

## 2021-05-23 DIAGNOSIS — H52202 Unspecified astigmatism, left eye: Secondary | ICD-10-CM | POA: Diagnosis not present

## 2021-05-23 DIAGNOSIS — H2512 Age-related nuclear cataract, left eye: Secondary | ICD-10-CM | POA: Diagnosis not present

## 2021-06-03 ENCOUNTER — Encounter: Payer: Self-pay | Admitting: Gastroenterology

## 2021-06-10 DIAGNOSIS — G3184 Mild cognitive impairment, so stated: Secondary | ICD-10-CM | POA: Diagnosis not present

## 2021-06-18 ENCOUNTER — Encounter: Payer: Self-pay | Admitting: General Surgery

## 2021-06-21 ENCOUNTER — Ambulatory Visit
Admission: RE | Admit: 2021-06-21 | Discharge: 2021-06-21 | Disposition: A | Discharge status: Home / Self Care | Payer: Medicare Other | Attending: Gastroenterology | Admitting: Gastroenterology

## 2021-06-21 ENCOUNTER — Other Ambulatory Visit: Payer: Self-pay

## 2021-06-21 ENCOUNTER — Ambulatory Visit: Payer: Medicare Other | Admitting: Anesthesiology

## 2021-06-21 ENCOUNTER — Encounter: Payer: Self-pay | Admitting: Gastroenterology

## 2021-06-21 ENCOUNTER — Encounter
Admission: RE | Disposition: A | Discharge status: Home / Self Care | Payer: Self-pay | Source: Home / Self Care | Attending: Gastroenterology

## 2021-06-21 DIAGNOSIS — Z833 Family history of diabetes mellitus: Secondary | ICD-10-CM | POA: Insufficient documentation

## 2021-06-21 DIAGNOSIS — Z8249 Family history of ischemic heart disease and other diseases of the circulatory system: Secondary | ICD-10-CM | POA: Insufficient documentation

## 2021-06-21 DIAGNOSIS — Z86010 Personal history of colonic polyps: Secondary | ICD-10-CM | POA: Diagnosis not present

## 2021-06-21 DIAGNOSIS — N183 Chronic kidney disease, stage 3 unspecified: Secondary | ICD-10-CM | POA: Insufficient documentation

## 2021-06-21 DIAGNOSIS — Z808 Family history of malignant neoplasm of other organs or systems: Secondary | ICD-10-CM | POA: Diagnosis not present

## 2021-06-21 DIAGNOSIS — Z1211 Encounter for screening for malignant neoplasm of colon: Secondary | ICD-10-CM | POA: Diagnosis not present

## 2021-06-21 DIAGNOSIS — Z882 Allergy status to sulfonamides status: Secondary | ICD-10-CM | POA: Insufficient documentation

## 2021-06-21 DIAGNOSIS — Z888 Allergy status to other drugs, medicaments and biological substances status: Secondary | ICD-10-CM | POA: Diagnosis not present

## 2021-06-21 DIAGNOSIS — Z881 Allergy status to other antibiotic agents status: Secondary | ICD-10-CM | POA: Diagnosis not present

## 2021-06-21 DIAGNOSIS — Z825 Family history of asthma and other chronic lower respiratory diseases: Secondary | ICD-10-CM | POA: Insufficient documentation

## 2021-06-21 DIAGNOSIS — K640 First degree hemorrhoids: Secondary | ICD-10-CM | POA: Insufficient documentation

## 2021-06-21 DIAGNOSIS — Z885 Allergy status to narcotic agent status: Secondary | ICD-10-CM | POA: Diagnosis not present

## 2021-06-21 HISTORY — PX: COLONOSCOPY WITH PROPOFOL: SHX5780

## 2021-06-21 SURGERY — COLONOSCOPY WITH PROPOFOL
Anesthesia: General | Site: Rectum

## 2021-06-21 MED ORDER — LIDOCAINE HCL (CARDIAC) PF 100 MG/5ML IV SOSY
PREFILLED_SYRINGE | INTRAVENOUS | Status: DC | PRN
  Administered 2021-06-21: 50 mg via INTRAVENOUS

## 2021-06-21 MED ORDER — LACTATED RINGERS IV SOLN: INTRAVENOUS | Status: DC

## 2021-06-21 MED ORDER — STERILE WATER FOR IRRIGATION IR SOLN
Status: DC | PRN
  Administered 2021-06-21: 1

## 2021-06-21 MED ORDER — PROPOFOL 10 MG/ML IV BOLUS
INTRAVENOUS | Status: DC | PRN
  Administered 2021-06-21 (×4): 20 mg via INTRAVENOUS
  Administered 2021-06-21: 30 mg via INTRAVENOUS
  Administered 2021-06-21: 70 mg via INTRAVENOUS
  Administered 2021-06-21 (×2): 20 mg via INTRAVENOUS

## 2021-06-21 MED ORDER — SODIUM CHLORIDE 0.9 % IV SOLN: INTRAVENOUS | Status: DC

## 2021-06-21 SURGICAL SUPPLY — 6 items
GOWN CVR UNV OPN BCK APRN NK (MISCELLANEOUS) ×2 IMPLANT
GOWN ISOL THUMB LOOP REG UNIV (MISCELLANEOUS) ×4
KIT PRC NS LF DISP ENDO (KITS) ×1 IMPLANT
KIT PROCEDURE OLYMPUS (KITS) ×2
MANIFOLD NEPTUNE II (INSTRUMENTS) ×2 IMPLANT
WATER STERILE IRR 250ML POUR (IV SOLUTION) ×2 IMPLANT

## 2021-06-21 NOTE — Anesthesia Postprocedure Evaluation (Signed)
Anesthesia Post Note  Patient: Lauren Johnston  Procedure(s) Performed: COLONOSCOPY WITH PROPOFOL (Rectum)     Patient location during evaluation: PACU Anesthesia Type: General Level of consciousness: awake and alert Pain management: pain level controlled Vital Signs Assessment: post-procedure vital signs reviewed and stable Respiratory status: spontaneous breathing Cardiovascular status: stable Anesthetic complications: no   No notable events documented.  Gillian Scarce

## 2021-06-21 NOTE — Anesthesia Preprocedure Evaluation (Signed)
Anesthesia Evaluation  Patient identified by MRN, date of birth, ID band Patient awake    Reviewed: Allergy & Precautions, H&P , NPO status , Patient's Chart, lab work & pertinent test results  Airway Mallampati: II  TM Distance: >3 FB Neck ROM: full    Dental no notable dental hx.    Pulmonary neg pulmonary ROS,    Pulmonary exam normal        Cardiovascular negative cardio ROS Normal cardiovascular exam Rhythm:regular Rate:Normal     Neuro/Psych negative neurological ROS     GI/Hepatic Neg liver ROS, Medicated,  Endo/Other  Hypothyroidism   Renal/GU      Musculoskeletal   Abdominal   Peds  Hematology negative hematology ROS (+)   Anesthesia Other Findings   Reproductive/Obstetrics                             Anesthesia Physical Anesthesia Plan  ASA: 2  Anesthesia Plan: General   Post-op Pain Management:    Induction:   PONV Risk Score and Plan: 3 and Propofol infusion  Airway Management Planned:   Additional Equipment:   Intra-op Plan:   Post-operative Plan:   Informed Consent: I have reviewed the patients History and Physical, chart, labs and discussed the procedure including the risks, benefits and alternatives for the proposed anesthesia with the patient or authorized representative who has indicated his/her understanding and acceptance.       Plan Discussed with:   Anesthesia Plan Comments:         Anesthesia Quick Evaluation

## 2021-06-21 NOTE — Op Note (Signed)
[facility] Gastroenterology Patient Name: Lauren Johnston Procedure Date: 06/21/2021 8:26 AM MRN: [id number] Account #: [id number] Date of Birth: [birth year] Admit Type: Outpatient Age: 70 Room: [facility] OR ROOM 01 Gender: Female Note Status: Finalized Instrument Name: [id number] Procedure:             Colonoscopy Indications:           High risk colon cancer surveillance: Personal history                         of colonic polyps Providers:             Lucilla Lame MD, MD Referring MD:          Bethena Roys. Sowles, MD (Referring MD) Medicines:             Propofol per Anesthesia Complications:         No immediate complications. Procedure:             Pre-Anesthesia Assessment:                        - Prior to the procedure, a History and Physical was                         performed, and patient medications and allergies were                         reviewed. The patient's tolerance of previous                         anesthesia was also reviewed. The risks and benefits                         of the procedure and the sedation options and risks                         were discussed with the patient. All questions were                         answered, and informed consent was obtained. Prior                         Anticoagulants: The patient has taken no previous                         anticoagulant or antiplatelet agents. ASA Grade                         Assessment: II - A patient with mild systemic disease.                         After reviewing the risks and benefits, the patient                         was deemed in satisfactory condition to undergo the                         procedure.  After obtaining informed consent, the colonoscope was                         passed under direct vision. Throughout the procedure,                         the patient's blood pressure, pulse, and oxygen                         saturations were monitored  continuously. The                         Colonoscope was introduced through the anus and                         advanced to the the cecum, identified by appendiceal                         orifice and ileocecal valve. The colonoscopy was                         performed without difficulty. The patient tolerated                         the procedure well. The quality of the bowel                         preparation was excellent. Findings:      The perianal and digital rectal examinations were normal.      Non-bleeding internal hemorrhoids were found during retroflexion. The       hemorrhoids were Grade I (internal hemorrhoids that do not prolapse). Impression:            - Non-bleeding internal hemorrhoids.                        - No specimens collected. Recommendation:        - Discharge patient to home.                        - Resume previous diet.                        - Repeat colonoscopy is not recommended for                         surveillance. Procedure Code(s):     --- Professional ---                        [phone], Colonoscopy, flexible; diagnostic, including                         collection of specimen(s) by brushing or washing, when                         performed (separate procedure) Diagnosis Code(s):     --- Professional ---                        Z86.010, Personal history of colonic polyps CPT copyright 2019  American Medical Association. All rights reserved. The codes documented in this report are preliminary and upon coder review may  be revised to meet current compliance requirements. Lucilla Lame MD, MD 06/21/2021 8:51:11 AM This report has been signed electronically. Number of Addenda: 0 Note Initiated On: 06/21/2021 8:26 AM Scope Withdrawal Time: 0 hours 7 minutes 31 seconds  Total Procedure Duration: 0 hours 11 minutes 56 seconds  Estimated Blood Loss:  Estimated blood loss: none.      [facility]

## 2021-06-21 NOTE — Transfer of Care (Signed)
Immediate Anesthesia Transfer of Care Note  Patient: Lauren Johnston  Procedure(s) Performed: COLONOSCOPY WITH PROPOFOL (Rectum)  Patient Location: PACU  Anesthesia Type: General  Level of Consciousness: awake, alert  and patient cooperative  Airway and Oxygen Therapy: Patient Spontanous Breathing and Patient connected to supplemental oxygen  Post-op Assessment: Post-op Vital signs reviewed, Patient's Cardiovascular Status Stable, Respiratory Function Stable, Patent Airway and No signs of Nausea or vomiting  Post-op Vital Signs: Reviewed and stable  Complications: No notable events documented.

## 2021-06-21 NOTE — H&P (Signed)
Lucilla Lame, MD [address] [place], [place] [address] [phone] Fax : [phone]  Primary Care Physician:  Steele Sizer, MD Primary Gastroenterologist:  Dr. Allen Norris  Pre-Procedure History & Physical: HPI:  Lauren Johnston is a 70 y.o. female is here for an colonoscopy.   Past Medical History:  Diagnosis Date   Arthritis    hips   Chronic kidney disease    STAGE 3   Dental crowns present    dental implants - upper   Depression    GERD (gastroesophageal reflux disease)    Hyperlipidemia    Hypothyroidism    Thyroid disease     Past Surgical History:  Procedure Laterality Date   ABDOMINAL HYSTERECTOMY     BILATERAL OOPHORECTOMY     COLONOSCOPY WITH PROPOFOL N/A 06/02/2016   Procedure: COLONOSCOPY WITH PROPOFOL;  Surgeon: Lucilla Lame, MD;  Location: [facility];  Service: Endoscopy;  Laterality: N/A;   dequavian tendonitis Left    KNEE ARTHROSCOPY Bilateral    POLYPECTOMY  06/02/2016   Procedure: POLYPECTOMY;  Surgeon: Lucilla Lame, MD;  Location: [facility];  Service: Endoscopy;;   THYROIDECTOMY N/A 06/18/2020   Procedure: THYROIDECTOMY, total;  Surgeon: Fredirick Maudlin, MD;  Location: [facility];  Service: General;  Laterality: N/A;   TONSILLECTOMY     AGE 23 OR 11    Prior to Admission medications   Medication Sig Start Date End Date Taking? Authorizing Provider  acetaminophen (TYLENOL) 500 MG tablet Take 500 mg by mouth every 6 (six) hours as needed.   Yes [provider]  atorvastatin (LIPITOR) 40 MG tablet Take 1 tablet (40 mg total) by mouth at bedtime. 04/18/21  Yes Sowles, Drue Stager, MD  benzonatate (TESSALON) 200 MG capsule Take 1 capsule (200 mg total) by mouth at bedtime. 04/18/21  Yes Sowles, Drue Stager, MD  Biotin w/ Vitamins C & E (HAIR SKIN & NAILS GUMMIES PO) Take 2 tablets by mouth every other day.   Yes [provider]  Calcium Carbonate (CALCIUM 600 PO) Take 600 mg by mouth in the morning and at  bedtime.   Yes [provider]  Cholecalciferol (VITAMIN D) 50 MCG (2000 UT) CAPS Take 2,000 Units by mouth daily.   Yes [provider]  DULoxetine (CYMBALTA) 60 MG capsule Take 1 capsule (60 mg total) by mouth every morning. 04/18/21  Yes Sowles, Drue Stager, MD  estradiol (ESTRACE) 0.5 MG tablet Take 1 tablet (0.5 mg total) by mouth daily. 02/18/21  Yes Sowles, Drue Stager, MD  levothyroxine (SYNTHROID) 112 MCG tablet Take by mouth. 04/03/21 04/03/22 Yes [provider]  memantine (NAMENDA) 5 MG tablet Take 1 tablet by mouth daily. 01/02/21 01/02/22 Yes Vladimir Crofts, MD  traZODone (DESYREL) 50 MG tablet Take 0.5-1 tablets (25-50 mg total) by mouth at bedtime as needed for sleep. 04/18/21  Yes Steele Sizer, MD    Allergies as of 04/24/2021 - Review Complete 04/18/2021  Allergen Reaction Noted   Sulfamethoxazole-trimethoprim Other (See Comments) and Rash 04/23/2015   Citalopram Itching 02/05/2016   Tetracycline Other (See Comments) 04/23/2015   Tetracyclines & related Itching 09/20/2013   Codeine Itching 09/20/2013    Family History  Problem Relation Age of Onset   Diabetes Mother    Heart disease Mother    Emphysema Father    Heart disease Father    COPD Father    Multiple myeloma Brother    Alzheimer's disease Sister    Dementia Sister     Social History  Socioeconomic History   Marital status: Married    Spouse name: Not on file   Number of children: 3   Years of education: Not on file   Highest education level: Some college, no degree  Occupational History   Occupation: retired  Tobacco Use   Smoking status: Never   Smokeless tobacco: Never  Vaping Use   Vaping Use: Never used  Substance and Sexual Activity   Alcohol use: No    Alcohol/week: 0.0 standard drinks   Drug use: No   Sexual activity: Yes    Partners: Male    Birth control/protection: Post-menopausal    Comment: Hysterectomy  Other Topics Concern   Not on file  Social History  Narrative   Widow, but remarried Dec 8th 2018   Social Determinants of Health   Financial Resource Strain: Low Risk    Difficulty of Paying Living Expenses: Not hard at all  Food Insecurity: No Food Insecurity   Worried About Charity fundraiser in the Last Year: Never true   Arboriculturist in the Last Year: Never true  Transportation Needs: No Transportation Needs   Lack of Transportation (Medical): No   Lack of Transportation (Non-Medical): No  Physical Activity: Inactive   Days of Exercise per Week: 0 days   Minutes of Exercise per Session: 0 min  Stress: No Stress Concern Present   Feeling of Stress : Only a little  Social Connections: Engineer, building services of Communication with Friends and Family: More than three times a week   Frequency of Social Gatherings with Friends and Family: Once a week   Attends Religious Services: More than 4 times per year   Active Member of [employer] or Organizations: Yes   Attends Music therapist: More than 4 times per year   Marital Status: Married  Human resources officer Violence: Not At Risk   Fear of Current or Ex-Partner: No   Emotionally Abused: No   Physically Abused: No   Sexually Abused: No    Review of Systems: See HPI, otherwise negative ROS  Physical Exam: BP 125/85   Pulse 74   Temp (!) 97.3 F (36.3 C) (Temporal)   Resp 16   Ht '5\' 6"'  (1.676 m)   Wt 92.1 kg   SpO2 100%   BMI 32.77 kg/m  General:   Alert,  pleasant and cooperative in NAD Head:  Normocephalic and atraumatic. Neck:  Supple; no masses or thyromegaly. Lungs:  Clear throughout to auscultation.    Heart:  Regular rate and rhythm. Abdomen:  Soft, nontender and nondistended. Normal bowel sounds, without guarding, and without rebound.   Neurologic:  Alert and  oriented x4;  grossly normal neurologically.  Impression/Plan: Lauren Johnston is here for an colonoscopy to be performed for a history of adenomatous polyps on 2017   Risks, benefits,  limitations, and alternatives regarding  colonoscopy have been reviewed with the patient.  Questions have been answered.  All parties agreeable.   Lucilla Lame, MD  06/21/2021, 7:57 AM

## 2021-06-21 NOTE — Anesthesia Procedure Notes (Signed)
Date/Time: 06/21/2021 8:31 AM Performed by: Mayme Genta, CRNA Pre-anesthesia Checklist: Patient identified, Emergency Drugs available, Suction available, Timeout performed and Patient being monitored Patient Re-evaluated:Patient Re-evaluated prior to induction Oxygen Delivery Method: Nasal cannula Placement Confirmation: positive ETCO2

## 2021-06-24 ENCOUNTER — Encounter: Payer: Self-pay | Admitting: Gastroenterology

## 2021-06-24 DIAGNOSIS — K219 Gastro-esophageal reflux disease without esophagitis: Secondary | ICD-10-CM | POA: Diagnosis not present

## 2021-06-24 DIAGNOSIS — R053 Chronic cough: Secondary | ICD-10-CM | POA: Diagnosis not present

## 2021-07-22 DIAGNOSIS — E890 Postprocedural hypothyroidism: Secondary | ICD-10-CM | POA: Diagnosis not present

## 2021-08-01 DIAGNOSIS — E890 Postprocedural hypothyroidism: Secondary | ICD-10-CM | POA: Diagnosis not present

## 2021-08-06 ENCOUNTER — Ambulatory Visit (INDEPENDENT_AMBULATORY_CARE_PROVIDER_SITE_OTHER): Payer: Medicare Other

## 2021-08-06 VITALS — BP 120/80 | HR 84 | Temp 97.4°F | Resp 16 | Ht 66.0 in | Wt 206.6 lb

## 2021-08-06 DIAGNOSIS — Z780 Asymptomatic menopausal state: Secondary | ICD-10-CM | POA: Diagnosis not present

## 2021-08-06 DIAGNOSIS — Z23 Encounter for immunization: Secondary | ICD-10-CM

## 2021-08-06 DIAGNOSIS — Z1231 Encounter for screening mammogram for malignant neoplasm of breast: Secondary | ICD-10-CM | POA: Diagnosis not present

## 2021-08-06 DIAGNOSIS — Z0000 Encounter for general adult medical examination without abnormal findings: Secondary | ICD-10-CM

## 2021-08-06 NOTE — Patient Instructions (Signed)
Ms. Lauren Johnston , Thank you for taking time to come for your Medicare Wellness Visit. I appreciate your ongoing commitment to your health goals. Please review the following plan we discussed and let me know if I can assist you in the future.   Screening recommendations/referrals: Colonoscopy: done 06/21/21. Repeat 06/2026 Mammogram: done 09/01/19. Please call [phone] to schedule your mammogram and bone density screening.  Bone Density: done 10/09/16 Recommended yearly ophthalmology/optometry visit for glaucoma screening and checkup Recommended yearly dental visit for hygiene and checkup  Vaccinations: Influenza vaccine: done today Pneumococcal vaccine: done 07/17/17 Tdap vaccine: done 04/18/21 Shingles vaccine: Shingrix discussed. Please contact your pharmacy for coverage information.  Covid-19: done 10/15/19, 11/12/19 & 10/17/20  Advanced directives: Please bring a copy of your health care power of attorney and living will to the office at your convenience once you have completed that paperwork.   Conditions/risks identified: Keep up the great work!  Next appointment: Follow up in one year for your annual wellness visit    Preventive Care 65 Years and Older, Female Preventive care refers to lifestyle choices and visits with your health care provider that can promote health and wellness. What does preventive care include? A yearly physical exam. This is also called an annual well check. Dental exams once or twice a year. Routine eye exams. Ask your health care provider how often you should have your eyes checked. Personal lifestyle choices, including: Daily care of your teeth and gums. Regular physical activity. Eating a healthy diet. Avoiding tobacco and drug use. Limiting alcohol use. Practicing safe sex. Taking low-dose aspirin every day. Taking vitamin and mineral supplements as recommended by your health care provider. What happens during an annual well check? The services and  screenings done by your health care provider during your annual well check will depend on your age, overall health, lifestyle risk factors, and family history of disease. Counseling  Your health care provider may ask you questions about your: Alcohol use. Tobacco use. Drug use. Emotional well-being. Home and relationship well-being. Sexual activity. Eating habits. History of falls. Memory and ability to understand (cognition). Work and work Statistician. Reproductive health. Screening  You may have the following tests or measurements: Height, weight, and BMI. Blood pressure. Lipid and cholesterol levels. These may be checked every 5 years, or more frequently if you are over 9 years old. Skin check. Lung cancer screening. You may have this screening every year starting at age 61 if you have a 30-pack-year history of smoking and currently smoke or have quit within the past 15 years. Fecal occult blood test (FOBT) of the stool. You may have this test every year starting at age 7. Flexible sigmoidoscopy or colonoscopy. You may have a sigmoidoscopy every 5 years or a colonoscopy every 10 years starting at age 66. Hepatitis C blood test. Hepatitis B blood test. Sexually transmitted disease (STD) testing. Diabetes screening. This is done by checking your blood sugar (glucose) after you have not eaten for a while (fasting). You may have this done every 1-3 years. Bone density scan. This is done to screen for osteoporosis. You may have this done starting at age 37. Mammogram. This may be done every 1-2 years. Talk to your health care provider about how often you should have regular mammograms. Talk with your health care provider about your test results, treatment options, and if necessary, the need for more tests. Vaccines  Your health care provider may recommend certain vaccines, such as: Influenza vaccine. This is recommended every  year. Tetanus, diphtheria, and acellular pertussis (Tdap,  Td) vaccine. You may need a Td booster every 10 years. Zoster vaccine. You may need this after age 56. Pneumococcal 13-valent conjugate (PCV13) vaccine. One dose is recommended after age 30. Pneumococcal polysaccharide (PPSV23) vaccine. One dose is recommended after age 51. Talk to your health care provider about which screenings and vaccines you need and how often you need them. This information is not intended to replace advice given to you by your health care provider. Make sure you discuss any questions you have with your health care provider. Document Released: 09/14/2015 Document Revised: 05/07/2016 Document Reviewed: 06/19/2015 Elsevier Interactive Patient Education  2017 [employer] Prevention in the Home Falls can cause injuries. They can happen to people of all ages. There are many things you can do to make your home safe and to help prevent falls. What can I do on the outside of my home? Regularly fix the edges of walkways and driveways and fix any cracks. Remove anything that might make you trip as you walk through a door, such as a raised step or threshold. Trim any bushes or trees on the path to your home. Use bright outdoor lighting. Clear any walking paths of anything that might make someone trip, such as rocks or tools. Regularly check to see if handrails are loose or broken. Make sure that both sides of any steps have handrails. Any raised decks and porches should have guardrails on the edges. Have any leaves, snow, or ice cleared regularly. Use sand or salt on walking paths during winter. Clean up any spills in your garage right away. This includes oil or grease spills. What can I do in the bathroom? Use night lights. Install grab bars by the toilet and in the tub and shower. Do not use towel bars as grab bars. Use non-skid mats or decals in the tub or shower. If you need to sit down in the shower, use a plastic, non-slip stool. Keep the floor dry. Clean up any  water that spills on the floor as soon as it happens. Remove soap buildup in the tub or shower regularly. Attach bath mats securely with double-sided non-slip rug tape. Do not have throw rugs and other things on the floor that can make you trip. What can I do in the bedroom? Use night lights. Make sure that you have a light by your bed that is easy to reach. Do not use any sheets or blankets that are too big for your bed. They should not hang down onto the floor. Have a firm chair that has side arms. You can use this for support while you get dressed. Do not have throw rugs and other things on the floor that can make you trip. What can I do in the kitchen? Clean up any spills right away. Avoid walking on wet floors. Keep items that you use a lot in easy-to-reach places. If you need to reach something above you, use a strong step stool that has a grab bar. Keep electrical cords out of the way. Do not use floor polish or wax that makes floors slippery. If you must use wax, use non-skid floor wax. Do not have throw rugs and other things on the floor that can make you trip. What can I do with my stairs? Do not leave any items on the stairs. Make sure that there are handrails on both sides of the stairs and use them. Fix handrails that are broken or  loose. Make sure that handrails are as long as the stairways. Check any carpeting to make sure that it is firmly attached to the stairs. Fix any carpet that is loose or worn. Avoid having throw rugs at the top or bottom of the stairs. If you do have throw rugs, attach them to the floor with carpet tape. Make sure that you have a light switch at the top of the stairs and the bottom of the stairs. If you do not have them, ask someone to add them for you. What else can I do to help prevent falls? Wear shoes that: Do not have high heels. Have rubber bottoms. Are comfortable and fit you well. Are closed at the toe. Do not wear sandals. If you use a  stepladder: Make sure that it is fully opened. Do not climb a closed stepladder. Make sure that both sides of the stepladder are locked into place. Ask someone to hold it for you, if possible. Clearly mark and make sure that you can see: Any grab bars or handrails. First and last steps. Where the edge of each step is. Use tools that help you move around (mobility aids) if they are needed. These include: Canes. Walkers. Scooters. Crutches. Turn on the lights when you go into a dark area. Replace any light bulbs as soon as they burn out. Set up your furniture so you have a clear path. Avoid moving your furniture around. If any of your floors are uneven, fix them. If there are any pets around you, be aware of where they are. Review your medicines with your doctor. Some medicines can make you feel dizzy. This can increase your chance of falling. Ask your doctor what other things that you can do to help prevent falls. This information is not intended to replace advice given to you by your health care provider. Make sure you discuss any questions you have with your health care provider. Document Released: 06/14/2009 Document Revised: 01/24/2016 Document Reviewed: 09/22/2014 Elsevier Interactive Patient Education  2017 [employer].

## 2021-08-06 NOTE — Progress Notes (Signed)
Subjective:   Lauren Johnston is a 70 y.o. female who presents for Medicare Annual (Subsequent) preventive examination.  Review of Systems     Cardiac Risk Factors include: advanced age (>65mn, >>65women);dyslipidemia;obesity (BMI >30kg/m2)     Objective:    Today's Vitals   08/06/21 1038  BP: 120/80  Pulse: 84  Resp: 16  Temp: (!) 97.4 F (36.3 C)  TempSrc: Oral  SpO2: 95%  Weight: 206 lb 9.6 oz (93.7 kg)  Height: '5\' 6"'  (1.676 m)   Body mass index is 33.35 kg/m.  Advanced Directives 08/06/2021 06/21/2021 08/02/2020 06/18/2020 06/12/2020 07/26/2019 07/20/2018  Does Patient Have a Medical Advance Directive? Yes Yes Yes Yes No Yes Yes  Type of AParamedicof [place]conom[place] Lake[place] will [place] will [place]North PearsallLiving will Living will;Healthcare Power of ANew HolsteinLiving will [place]ad[place]Living will  Does patient want to make changes to medical advance directive? - No - Guardian declined - No - Patient declined - - -  Copy of HSheridanin Chart? No - copy requested No - copy requested No - copy requested No - copy requested - No - copy requested No - copy requested  Would patient like information on creating a medical advance directive? - - - No - Patient declined - - -    Current Medications (verified) Outpatient Encounter Medications as of 08/06/2021  Medication Sig   acetaminophen (TYLENOL) 500 MG tablet Take 500 mg by mouth every 6 (six) hours as needed.   Artificial Tear Solution (SYSTANE CONTACTS) SOLN Apply to eye.   atorvastatin (LIPITOR) 40 MG tablet Take 1 tablet (40 mg total) by mouth at bedtime.   benzonatate (TESSALON) 200 MG capsule Take 1 capsule (200 mg total) by mouth at bedtime.   Biotin w/ Vitamins C & E (HAIR SKIN & NAILS GUMMIES PO) Take 2 tablets by mouth every other day.   Calcium Carbonate (CALCIUM 600 PO) Take 600 mg by mouth in  the morning and at bedtime.   Cholecalciferol (VITAMIN D) 50 MCG (2000 UT) CAPS Take 2,000 Units by mouth daily.   DULoxetine (CYMBALTA) 60 MG capsule Take 1 capsule (60 mg total) by mouth every morning.   estradiol (ESTRACE) 0.5 MG tablet Take 1 tablet (0.5 mg total) by mouth daily.   levothyroxine (SYNTHROID) 100 MCG tablet Take 1 tablet by mouth in the morning. Take 30-60 minutes before breakfast   memantine (NAMENDA) 10 MG tablet Take 1 tablet by mouth daily.   traZODone (DESYREL) 50 MG tablet Take 0.5-1 tablets (25-50 mg total) by mouth at bedtime as needed for sleep.   [DISCONTINUED] levothyroxine (SYNTHROID) 112 MCG tablet Take by mouth.   [DISCONTINUED] memantine (NAMENDA) 5 MG tablet Take 1 tablet by mouth daily.   No facility-administered encounter medications on file as of 08/06/2021.    Allergies (verified) Sulfamethoxazole-trimethoprim, Citalopram, Omeprazole, Tetracycline, Tetracyclines & related, and Codeine   History: Past Medical History:  Diagnosis Date   Arthritis    hips   Chronic kidney disease    STAGE 3   Dental crowns present    dental implants - upper   Depression    GERD (gastroesophageal reflux disease)    Hyperlipidemia    Hypothyroidism    Thyroid disease    Past Surgical History:  Procedure Laterality Date   ABDOMINAL HYSTERECTOMY     BILATERAL OOPHORECTOMY     COLONOSCOPY WITH PROPOFOL N/A 06/02/2016  Procedure: COLONOSCOPY WITH PROPOFOL;  Surgeon: Lucilla Lame, MD;  Location: [place];  Service: Endoscopy;  Laterality: N/A;   COLONOSCOPY WITH PROPOFOL N/A 06/21/2021   Procedure: COLONOSCOPY WITH PROPOFOL;  Surgeon: Lucilla Lame, MD;  Location: [place];  Service: Endoscopy;  Laterality: N/A;   dequavian tendonitis Left    KNEE ARTHROSCOPY Bilateral    POLYPECTOMY  06/02/2016   Procedure: POLYPECTOMY;  Surgeon: Lucilla Lame, MD;  Location: [place];  Service: Endoscopy;;   THYROIDECTOMY N/A 06/18/2020   Procedure:  THYROIDECTOMY, total;  Surgeon: Fredirick Maudlin, MD;  Location: [facility];  Service: General;  Laterality: N/A;   TONSILLECTOMY     AGE 24 OR 35   Family History  Problem Relation Age of Onset   Diabetes Mother    Heart disease Mother    Emphysema Father    Heart disease Father    COPD Father    Multiple myeloma Brother    Alzheimer's disease Sister    Dementia Sister    Social History   Socioeconomic History   Marital status: Married    Spouse name: Not on file   Number of children: 3   Years of education: Not on file   Highest education level: Some college, no degree  Occupational History   Occupation: retired  Tobacco Use   Smoking status: Never   Smokeless tobacco: Never  Vaping Use   Vaping Use: Never used  Substance and Sexual Activity   Alcohol use: No    Alcohol/week: 0.0 standard drinks   Drug use: No   Sexual activity: Yes    Partners: Male    Birth control/protection: Post-menopausal    Comment: Hysterectomy  Other Topics Concern   Not on file  Social History Narrative   Widow, but remarried Dec 8th 2018   Social Determinants of Health   Financial Resource Strain: Low Risk    Difficulty of Paying Living Expenses: Not hard at all  Food Insecurity: No Food Insecurity   Worried About Charity fundraiser in the Last Year: Never true   Arboriculturist in the Last Year: Never true  Transportation Needs: No Transportation Needs   Lack of Transportation (Medical): No   Lack of Transportation (Non-Medical): No  Physical Activity: Insufficiently Active   Days of Exercise per Week: 3 days   Minutes of Exercise per Session: 10 min  Stress: No Stress Concern Present   Feeling of Stress : Only a little  Social Connections: Engineer, building services of Communication with Friends and Family: More than three times a week   Frequency of Social Gatherings with Friends and Family: Once a week   Attends Religious Services: More than 4 times per year   Active  Member of [employer] or Organizations: Yes   Attends Music therapist: More than 4 times per year   Marital Status: Married    Tobacco Counseling Counseling given: Not Answered   Clinical Intake:  Pre-visit preparation completed: Yes  Pain : No/denies pain     BMI - recorded: 33.35 Nutritional Status: BMI > 30  Obese Nutritional Risks: None Diabetes: No  How often do you need to have someone help you when you read instructions, pamphlets, or other written materials from your doctor or pharmacy?: 1 - Never    Interpreter Needed?: No  Information entered by :: Clemetine Marker LPN   Activities of Daily Living In your present state of health, do you have any difficulty  performing the following activities: 08/06/2021 06/21/2021  Hearing? N N  Vision? N N  Difficulty concentrating or making decisions? Y N  Walking or climbing stairs? N N  Dressing or bathing? N N  Doing errands, shopping? N -  Preparing Food and eating ? N -  Using the Toilet? N -  In the past six months, have you accidently leaked urine? N -  Do you have problems with loss of bowel control? N -  Managing your Medications? N -  Managing your Finances? N -  Housekeeping or managing your Housekeeping? N -  Some recent data might be hidden    Patient Care Team: Steele Sizer, MD as PCP - General (Family Medicine) Gabriel Carina, Betsey Holiday, MD as Physician Assistant (Endocrinology) Lovell Sheehan, MD as Consulting Physician (Orthopedic Surgery)  Indicate any recent Medical Services you may have received from other than [facility] providers in the past year (date may be approximate).     Assessment:   This is a routine wellness examination for Alexys.  Hearing/Vision screen Hearing Screening - Comments::  Pt denies hearing difficulty Vision Screening - Comments:: Annual vision screenings at [place] issues and exercise activities discussed: Current Exercise Habits: Home exercise routine,  Type of exercise: calisthenics;stretching, Time (Minutes): 20, Frequency (Times/Week): 3, Weekly Exercise (Minutes/Week): 60, Intensity: Mild, Exercise limited by: None identified   Goals Addressed   None    Depression Screen PHQ 2/9 Scores 08/06/2021 04/18/2021 01/14/2021 10/15/2020 08/02/2020 04/16/2020 11/14/2019  PHQ - 2 Score '4 6 1 1 ' 0 0 0  PHQ- 9 Score '9 10 3 3 ' - 0 0    Fall Risk Fall Risk  08/06/2021 04/18/2021 01/14/2021 10/15/2020 08/02/2020  Falls in the past year? 0 0 0 0 0  Number falls in past yr: 0 0 0 0 0  Injury with Fall? 0 0 0 0 0  Risk for fall due to : No Fall Risks - - - No Fall Risks  Follow up Falls prevention discussed - - - Falls prevention discussed    FALL RISK PREVENTION PERTAINING TO THE HOME:  Any stairs in or around the home? Yes  If so, are there any without handrails? No  Home free of loose throw rugs in walkways, pet beds, electrical cords, etc? Yes  Adequate lighting in your home to reduce risk of falls? Yes   ASSISTIVE DEVICES UTILIZED TO PREVENT FALLS:  Life alert? No  Use of a cane, walker or w/c? No  Grab bars in the bathroom? Yes  Shower chair or bench in shower? Yes  Elevated toilet seat or a handicapped toilet? Yes  TIMED UP AND GO:  Was the test performed? Yes .  Length of time to ambulate 10 feet: 5 sec.   Gait steady and fast without use of assistive device  Cognitive Function: Cognitive status assessed by direct observation. Patient has current diagnosis of mild cognitive impairment. Patient is followed by neurology for ongoing assessment.       6CIT Screen 08/02/2020 07/20/2018  What Year? 0 points 0 points  What month? 0 points 0 points  What time? 0 points 0 points  Count back from 20 0 points 0 points  Months in reverse 0 points 0 points  Repeat phrase 2 points 2 points  Total Score 2 2    Immunizations Immunization History  Administered Date(s) Administered   Fluad Quad(high Dose 65+) 05/06/2019, 08/02/2020, 08/06/2021    Influenza Split 06/16/2007, 06/19/2008, 09/07/2008, 07/05/2009, 04/25/2010  Influenza, High Dose Seasonal PF 05/14/2017, 06/02/2018   Influenza, Seasonal, Injecte, Preservative Fre 06/25/2011, 05/26/2012, 05/18/2013   Influenza,inj,Quad PF,6+ Mos 06/07/2014, 04/23/2015, 05/07/2016   Influenza-Unspecified 06/07/2014   Moderna Sars-Covid-2 Vaccination 10/15/2019, 11/12/2019, 10/17/2020   Pneumococcal Conjugate-13 07/17/2017   Pneumococcal Polysaccharide-23 02/05/2016   Tdap 08/01/2010, 04/18/2021   Zoster, Live 04/07/2011    TDAP status: Up to date  Flu Vaccine status: Completed at today's visit  Pneumococcal vaccine status: Up to date  Covid-19 vaccine status: Completed vaccines  Qualifies for Shingles Vaccine? Yes   Zostavax completed Yes   Shingrix Completed?: No.    Education has been provided regarding the importance of this vaccine. Patient has been advised to call insurance company to determine out of pocket expense if they have not yet received this vaccine. Advised may also receive vaccine at local pharmacy or Health Dept. Verbalized acceptance and understanding.  Screening Tests Health Maintenance  Topic Date Due   Zoster Vaccines- Shingrix (1 of 2) Never done   COVID-19 Vaccine (4 - Booster for Moderna series) 12/12/2020   MAMMOGRAM  08/31/2021   COLONOSCOPY (Pts 45-69yr Insurance coverage will need to be confirmed)  06/21/2026   TETANUS/TDAP  04/19/2031   Pneumonia Vaccine 70 Years old  Completed   INFLUENZA VACCINE  Completed   DEXA SCAN  Completed   Hepatitis C Screening  Completed   HPV VACCINES  Aged Out    Health Maintenance  Health Maintenance Due  Topic Date Due   Zoster Vaccines- Shingrix (1 of 2) Never done   COVID-19 Vaccine (4 - Booster for Moderna series) 12/12/2020    Colorectal cancer screening: Type of screening: Colonoscopy. Completed 06/21/21. Repeat every 5 years  Mammogram status: Completed 09/01/19. Repeat every year. Ordered today.    Bone Density status: Completed 10/09/16. Results reflect: Bone density results: OSTEOPENIA. Repeat every 2 years. Ordered today  Lung Cancer Screening: (Low Dose CT Chest recommended if Age 71-80years, 30 pack-year currently smoking OR have quit w/in 15years.) does not qualify.   Additional Screening:  Hepatitis C Screening: does qualify; Completed 07/17/17  Vision Screening: Recommended annual ophthalmology exams for early detection of glaucoma and other disorders of the eye. Is the patient up to date with their annual eye exam?  Yes  Who is the provider or what [facility] the [facility]   Dental Screening: Recommended annual dental exams for proper oral hygiene  Community Resource Referral / Chronic Care Management: CRR required this visit?  No   CCM required this visit?  No      Plan:     I have personally reviewed and noted the following in the patient's chart:   Medical and social history Use of alcohol, tobacco or illicit drugs  Current medications and supplements including opioid prescriptions.  Functional ability and status Nutritional status Physical activity Advanced directives List of other physicians Hospitalizations, surgeries, and ER visits in previous 12 months Vitals Screenings to include cognitive, depression, and falls Referrals and appointments  In addition, I have reviewed and discussed with patient certain preventive protocols, quality metrics, and best practice recommendations. A written personalized care plan for preventive services as well as general preventive health recommendations were provided to patient.     KClemetine Marker LPN   132/10/252  Nurse Notes: pt c/o phlegm only at night and coughing it up; wakes her up at night. Pt has been seen by pulmnology and ENT  for this problem. Pt states zyrtec did not help and does not think it is reflux and omeprazole caused a reaction.  This has been an ongoing issue. Pt scheduled to follow up with ENT for cough.

## 2021-08-20 ENCOUNTER — Other Ambulatory Visit: Payer: Self-pay | Admitting: Family Medicine

## 2021-08-20 DIAGNOSIS — Z780 Asymptomatic menopausal state: Secondary | ICD-10-CM

## 2021-08-20 NOTE — Telephone Encounter (Signed)
Medication Refill - Medication:  estradiol (ESTRACE) 0.5 MG tablet   Has the patient contacted their pharmacy? Yes.   Contact PCP  Preferred Pharmacy (with phone number or street name):  [facility], [address] [place]  [place], [place] [address]  Phone:  [phone]  Fax:  [phone]   Has the patient been seen for an appointment in the last year OR does the patient have an upcoming appointment? Yes.    Agent: Please be advised that RX refills may take up to 3 business days. We ask that you follow-up with your pharmacy.

## 2021-08-21 MED ORDER — ESTRADIOL 0.5 MG PO TABS: 0.5000 mg | ORAL_TABLET | Freq: Every day | ORAL | 1 refills | Status: DC

## 2021-08-21 NOTE — Telephone Encounter (Signed)
Requested Prescriptions  Pending Prescriptions Disp Refills   estradiol (ESTRACE) 0.5 MG tablet 90 tablet 1    Sig: Take 1 tablet (0.5 mg total) by mouth daily.     OB/GYN:  Estrogens Passed - 08/20/2021  3:00 PM      Passed - Mammogram is up-to-date per Health Maintenance      Passed - Last BP in normal range    BP Readings from Last 1 Encounters:  08/06/21 120/80         Passed - Valid encounter within last 12 months    Recent Outpatient Visits          4 months ago Moderate episode of recurrent major depressive disorder [facility])   [facility] Steele Sizer, MD   7 months ago History of thyroidectomy   [facility] Steele Sizer, MD   10 months ago Stage 3a chronic kidney disease [facility])   [facility] Steele Sizer, MD   1 year ago Chronic cough   [facility] Steele Sizer, MD   1 year ago Pelvic pain in female   [facility] Delsa Grana, PA-C      Future Appointments            In 2 months Steele Sizer, MD [facility] [place]   In 11 months  [facility], [facility]

## 2021-09-11 ENCOUNTER — Ambulatory Visit (INDEPENDENT_AMBULATORY_CARE_PROVIDER_SITE_OTHER): Payer: Medicare Other | Admitting: Physician Assistant

## 2021-09-11 ENCOUNTER — Encounter: Payer: Self-pay | Admitting: Physician Assistant

## 2021-09-11 VITALS — BP 122/74 | HR 81 | Temp 97.6°F | Resp 16 | Ht 66.0 in | Wt 207.4 lb

## 2021-09-11 DIAGNOSIS — G4709 Other insomnia: Secondary | ICD-10-CM | POA: Diagnosis not present

## 2021-09-11 DIAGNOSIS — R053 Chronic cough: Secondary | ICD-10-CM

## 2021-09-11 NOTE — Assessment & Plan Note (Signed)
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

## 2021-09-11 NOTE — Assessment & Plan Note (Addendum)
Chronic since 2018 without significant improvement from thyroidectomy and consultation with ENT Reports coughing is worse at night, she is unable to sleep in supine position - must have head elevated- still wakes up coughing and choking while head is elevated Today patient and her partner agree to Sleep study to determine if OSA or other sleep condition is at play Ambulatory referral placed to sleep studies  Unable to rule out potential untreated asthma, COPD, GERD, laryngopharyngeal reflux,  If sleep study does not have definitive results - recommend referral to pulmonology for asthma, COPD, assessment  GI referral may be required if these other systems are ruled out as causative  Verbalized this plan with patient and she agreed.

## 2021-09-11 NOTE — Patient Instructions (Addendum)
Today we discussed your chronic cough concerns  This could be caused by a number of conditions I think it would be prudent to start by getting a sleep study for potential sleep apnea   I have placed a referral for this so they should contact you to schedule an appointment  We can follow up after that has been performed to discuss results and next steps  It was nice to meet you and I appreciate the opportunity to be involved in your care

## 2021-09-11 NOTE — Progress Notes (Signed)
Acute Office Visit  Subjective:    Patient ID: Lauren Johnston, female    DOB: 1950/12/17, 71 y.o.   MRN: [id number]  Introduced myself to the patient as a PA-C and provided education on APPs in clinical practice.    Chief Complaint  Patient presents with   Cough    Ongoing cough since 2018, pt states she continues having phlegm sitting in her throat and is very concerned why the cough has not got any better.     Cough Associated symptoms include shortness of breath and wheezing. Pertinent negatives include no chest pain, postnasal drip or sore throat.  Patient is in today for chronic productive nighttime cough. Reports intermittent daytime symptoms but nighttime is the worst States she wakes up coughing and choking Reports muscle aches in chest from coughing along with stress incontinence from coughing She states she is taking cough medicine at night which is mildly beneficial Reports she wakes up and is tired in the AM Reports that she cannot sleep flat on her back- has to have head of bed elevated   Reports she went to ENT and was given something for the coughing but she did not like the way it make her feel (palpitations and increased libido)   Patient's husband states she does have a mild snore   Denies heartburn, GI upset or chest tightness.   Past Medical History:  Diagnosis Date   Arthritis    hips   Chronic kidney disease    STAGE 3   Dental crowns present    dental implants - upper   Depression    GERD (gastroesophageal reflux disease)    Hyperlipidemia    Hypothyroidism    Thyroid disease     Past Surgical History:  Procedure Laterality Date   ABDOMINAL HYSTERECTOMY     BILATERAL OOPHORECTOMY     COLONOSCOPY WITH PROPOFOL N/A 06/02/2016   Procedure: COLONOSCOPY WITH PROPOFOL;  Surgeon: Lucilla Lame, MD;  Location: [place];  Service: Endoscopy;  Laterality: N/A;   COLONOSCOPY WITH PROPOFOL N/A 06/21/2021   Procedure: COLONOSCOPY WITH PROPOFOL;   Surgeon: Lucilla Lame, MD;  Location: [place];  Service: Endoscopy;  Laterality: N/A;   dequavian tendonitis Left    KNEE ARTHROSCOPY Bilateral    POLYPECTOMY  06/02/2016   Procedure: POLYPECTOMY;  Surgeon: Lucilla Lame, MD;  Location: [place];  Service: Endoscopy;;   THYROIDECTOMY N/A 06/18/2020   Procedure: THYROIDECTOMY, total;  Surgeon: Fredirick Maudlin, MD;  Location: [facility];  Service: General;  Laterality: N/A;   TONSILLECTOMY     AGE 4 OR 30    Family History  Problem Relation Age of Onset   Diabetes Mother    Heart disease Mother    Emphysema Father    Heart disease Father    COPD Father    Multiple myeloma Brother    Alzheimer's disease Sister    Dementia Sister     Social History   Socioeconomic History   Marital status: Married    Spouse name: Not on file   Number of children: 3   Years of education: Not on file   Highest education level: Some college, no degree  Occupational History   Occupation: retired  Tobacco Use   Smoking status: Never   Smokeless tobacco: Never  Vaping Use   Vaping Use: Never used  Substance and Sexual Activity   Alcohol use: No    Alcohol/week: 0.0 standard drinks   Drug use: No  Sexual activity: Yes    Partners: Male    Birth control/protection: Post-menopausal    Comment: Hysterectomy  Other Topics Concern   Not on file  Social History Narrative   Widow, but remarried Dec 8th 2018   Social Determinants of Health   Financial Resource Strain: Low Risk    Difficulty of Paying Living Expenses: Not hard at all  Food Insecurity: No Food Insecurity   Worried About Charity fundraiser in the Last Year: Never true   Arboriculturist in the Last Year: Never true  Transportation Needs: No Transportation Needs   Lack of Transportation (Medical): No   Lack of Transportation (Non-Medical): No  Physical Activity: Insufficiently Active   Days of Exercise per Week: 3 days   Minutes of Exercise per Session:  10 min  Stress: No Stress Concern Present   Feeling of Stress : Only a little  Social Connections: Engineer, building services of Communication with Friends and Family: More than three times a week   Frequency of Social Gatherings with Friends and Family: Once a week   Attends Religious Services: More than 4 times per year   Active Member of [employer] or Organizations: Yes   Attends Music therapist: More than 4 times per year   Marital Status: Married  Human resources officer Violence: Not At Risk   Fear of Current or Ex-Partner: No   Emotionally Abused: No   Physically Abused: No   Sexually Abused: No    Outpatient Medications Prior to Visit  Medication Sig Dispense Refill   acetaminophen (TYLENOL) 500 MG tablet Take 500 mg by mouth every 6 (six) hours as needed.     Artificial Tear Solution (SYSTANE CONTACTS) SOLN Apply to eye.     atorvastatin (LIPITOR) 40 MG tablet Take 1 tablet (40 mg total) by mouth at bedtime. 90 tablet 1   benzonatate (TESSALON) 200 MG capsule Take 1 capsule (200 mg total) by mouth at bedtime. 90 capsule 1   Biotin w/ Vitamins C & E (HAIR SKIN & NAILS GUMMIES PO) Take 2 tablets by mouth every other day.     Calcium Carbonate (CALCIUM 600 PO) Take 600 mg by mouth in the morning and at bedtime.     Cholecalciferol (VITAMIN D) 50 MCG (2000 UT) CAPS Take 2,000 Units by mouth daily.     DULoxetine (CYMBALTA) 60 MG capsule Take 1 capsule (60 mg total) by mouth every morning. 90 capsule 1   estradiol (ESTRACE) 0.5 MG tablet Take 1 tablet (0.5 mg total) by mouth daily. 90 tablet 1   levothyroxine (SYNTHROID) 100 MCG tablet Take 1 tablet by mouth in the morning. Take 30-60 minutes before breakfast     memantine (NAMENDA) 10 MG tablet Take 1 tablet by mouth daily.     traZODone (DESYREL) 50 MG tablet Take 0.5-1 tablets (25-50 mg total) by mouth at bedtime as needed for sleep. 90 tablet 1   No facility-administered medications prior to visit.    Allergies   Allergen Reactions   Sulfamethoxazole-Trimethoprim Other (See Comments) and Rash   Citalopram Itching   Omeprazole Palpitations    Also weakness   Tetracycline Other (See Comments)    Other Reaction: URTICARIA   Tetracyclines & Related Itching   Codeine Itching    Review of Systems  Constitutional:  Positive for fatigue.  HENT:  Negative for postnasal drip, sore throat and trouble swallowing.   Respiratory:  Positive for cough, shortness of breath and wheezing.  Cardiovascular:  Negative for chest pain.  Gastrointestinal:  Negative for constipation, diarrhea, nausea and vomiting.       States in the AM she has a "nasty taste" in her mouth "like I've had phlegm in my mouth"       Objective:    Physical Exam Constitutional:      Appearance: Normal appearance. She is obese.  HENT:     Head: Normocephalic and atraumatic.     Mouth/Throat:     Mouth: Mucous membranes are moist.     Pharynx: No oropharyngeal exudate or posterior oropharyngeal erythema.  Eyes:     Extraocular Movements: Extraocular movements intact.     Conjunctiva/sclera: Conjunctivae normal.     Pupils: Pupils are equal, round, and reactive to light.  Cardiovascular:     Rate and Rhythm: Normal rate and regular rhythm.     Pulses: Normal pulses.     Heart sounds: Normal heart sounds.  Pulmonary:     Effort: Pulmonary effort is normal.     Breath sounds: Normal breath sounds. No decreased breath sounds, wheezing, rhonchi or rales.  Abdominal:     General: Bowel sounds are normal.     Palpations: Abdomen is soft.  Musculoskeletal:     Cervical back: Normal range of motion and neck supple.     Right lower leg: No edema.     Left lower leg: No edema.  Neurological:     Mental Status: She is alert.    BP 122/74    Pulse 81    Temp 97.6 F (36.4 C) (Oral)    Resp 16    Ht _0  (1.676 m)    Wt 207 lb 6.4 oz (94.1 kg)    SpO2 95%    BMI 33.48 kg/m  Wt Readings from Last 3 Encounters:  09/11/21 207 lb  6.4 oz (94.1 kg)  08/06/21 206 lb 9.6 oz (93.7 kg)  06/21/21 203 lb (92.1 kg)    Health Maintenance Due  Topic Date Due   Zoster Vaccines- Shingrix (1 of 2) Never done   COVID-19 Vaccine (4 - Booster for Moderna series) 12/12/2020   MAMMOGRAM  08/31/2021    There are no preventive care reminders to display for this patient.   Lab Results  Component Value Date   TSH 0.01 (L) 10/15/2020   Lab Results  Component Value Date   WBC 5.2 10/15/2020   HGB 13.0 10/15/2020   HCT 39.4 10/15/2020   MCV 91.8 10/15/2020   PLT 304 10/15/2020   Lab Results  Component Value Date   NA 139 10/15/2020   K 4.2 10/15/2020   CO2 32 10/15/2020   GLUCOSE 96 10/15/2020   BUN 14 10/15/2020   CREATININE 0.94 (H) 10/15/2020   BILITOT 0.4 10/15/2020   ALKPHOS 61 07/09/2016   AST 20 10/15/2020   ALT 27 10/15/2020   PROT 6.4 10/15/2020   ALBUMIN 3.6 06/19/2020   CALCIUM 8.9 10/15/2020   ANIONGAP 7 06/14/2020   Lab Results  Component Value Date   CHOL 130 10/15/2020   Lab Results  Component Value Date   HDL 42 (L) 10/15/2020   Lab Results  Component Value Date   LDLCALC 72 10/15/2020   Lab Results  Component Value Date   TRIG 84 10/15/2020   Lab Results  Component Value Date   CHOLHDL 3.1 10/15/2020   Lab Results  Component Value Date   HGBA1C 5.7 (H) 10/24/2019       Assessment &  Plan:   Problem List Items Addressed This Visit       Other   Insomnia - Primary    Insomnia - appears chronic and likely maintenance based due to coughing episodes that occur while sleeping  Work up underway to determine etiology of chronic cough      Relevant Orders   Ambulatory referral to Sleep Studies   Chronic cough    Chronic since 2018 without significant improvement from thyroidectomy and consultation with ENT Reports coughing is worse at night, she is unable to sleep in supine position - must have head elevated- still wakes up coughing and choking while head is elevated Today  patient and her partner agree to Sleep study to determine if OSA or other sleep condition is at play Ambulatory referral placed to sleep studies  Unable to rule out potential untreated asthma, COPD, GERD, laryngopharyngeal reflux,  If sleep study does not have definitive results - recommend referral to pulmonology for asthma, COPD, assessment  GI referral may be required if these other systems are ruled out as causative  Verbalized this plan with patient and she agreed.        Other Visit Diagnoses     Chronic coughing       Relevant Orders   Ambulatory referral to Sleep Studies      Problem List Items Addressed This Visit       Other   Insomnia - Primary    Insomnia - appears chronic and likely maintenance based due to coughing episodes that occur while sleeping  Work up underway to determine etiology of chronic cough      Relevant Orders   Ambulatory referral to Sleep Studies   Chronic cough    Chronic since 2018 without significant improvement from thyroidectomy and consultation with ENT Reports coughing is worse at night, she is unable to sleep in supine position - must have head elevated- still wakes up coughing and choking while head is elevated Today patient and her partner agree to Sleep study to determine if OSA or other sleep condition is at play Ambulatory referral placed to sleep studies  Unable to rule out potential untreated asthma, COPD, GERD, laryngopharyngeal reflux,  If sleep study does not have definitive results - recommend referral to pulmonology for asthma, COPD, assessment  GI referral may be required if these other systems are ruled out as causative  Verbalized this plan with patient and she agreed.        Other Visit Diagnoses     Chronic coughing       Relevant Orders   Ambulatory referral to Sleep Studies        Return in about 3 months (around 12/10/2021) for chronic cough.   I, Giacomo Valone E Lakin Rhine, PA-C, have reviewed all documentation for  this visit. The documentation on 09/11/21 for the exam, diagnosis, procedures, and orders are all accurate and complete.   Shadell Brenn, Glennie Isle MPH [facility]    No orders of the defined types were placed in this encounter.    Genia Perin E Keller Bounds, PA-C

## 2021-09-16 ENCOUNTER — Ambulatory Visit: Payer: Medicare Other | Admitting: Physician Assistant

## 2021-10-22 NOTE — Progress Notes (Signed)
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 Celine Ahr 04/12/2020 . She has a thyroidectomy 06/18/2020 , last TSH done by endo still suppressed and she is down to 100 mcg daily. She has regular follow ups with Endo   Chronic cough: seen by Dr. Patsey Berthold  also seen by ENT. She had thyroidectomy and symptoms during the day improved, however continues to wake up during the night with productive cough. Never smoked but exposed to second hand smoking . Negative PFT's. Dr. Richardson Landry gave her omeprazole but she was not able to tolerate it - caused her to get sexually aroused and she stopped it.   Hyperlipidemia: taking Atorvastatin , reviewed last labs , LDL was 72 , HDL was 42 reminded her to eat more fish and continue  tree nuts daily  We will recheck labs today   OA both knees: stable, taking tylenol prn now, no effusion or redness. Mild soreness but not affecting qualify of life Unchanged    Insomnia she takes trazodone and states tessalon perles does not work, advised to try taking pepcid AC since unable to take PPI   CKI stage III: discussed avoiding NSAID's and drink more water, taking Tylenol prn Last GFR stable  She denies pruritis or decrease , no problems voiding.    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 She still feels it is affecting her memory and has mental fogginess . She states her husband and girlfriends helps her with directions now. She saw Dr/ Manuella Ghazi and was diagnosed with mild cognitive dysfunction , she is now on Namenda 5 mg once daily  B12 deficiency: she is not taking SL B12 daily   Major Depression: She is on her third marriage, first marriage was not good, her twins were 7 when he left them, second marriage was good but he died when she  was 71 yo, she is on 66 rd marriage and together for the past few years, they met during a high school reunion. She continues to feel upset about her memory, she sates her children don't understand it Husband is supportive . She is taking Duloxetein. 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 10/15/2020   Pure hypercholesterolemia 10/15/2020   Stage 3a chronic kidney disease ([place]) 10/15/2020   Memory changes 10/15/2020   S/P total thyroidectomy 06/18/2020   Class 1 obesity in adult 11/11/2016   Osteopenia 10/09/2016   Special screening for malignant neoplasms, colon    Rectal polyp    Right wrist tendonitis 05/16/2016   Osteoarthritis of left hip 05/16/2016   Vitamin D deficiency 02/05/2016   Degeneration of intervertebral disc of cervical region 02/05/2016   Insomnia 04/23/2015  Major depression in remission ([place]) 04/23/2015   Chronic pain 04/23/2015   Goiter, nontoxic, 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   Hypothyroidism, 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   Uterine leiomyoma 06/16/2007    Past Surgical History:  Procedure Laterality Date   ABDOMINAL HYSTERECTOMY     BILATERAL OOPHORECTOMY     COLONOSCOPY WITH PROPOFOL N/A 06/02/2016   Procedure: COLONOSCOPY WITH PROPOFOL;  Surgeon: Lucilla Lame, MD;  Location: [place];  Service: Endoscopy;  Laterality: N/A;   COLONOSCOPY WITH PROPOFOL N/A 06/21/2021   Procedure: COLONOSCOPY WITH PROPOFOL;  Surgeon: Lucilla Lame, MD;  Location: [place];  Service: Endoscopy;   Laterality: N/A;   dequavian tendonitis Left    KNEE ARTHROSCOPY Bilateral    POLYPECTOMY  06/02/2016   Procedure: POLYPECTOMY;  Surgeon: Lucilla Lame, MD;  Location: [place];  Service: Endoscopy;;   THYROIDECTOMY N/A 06/18/2020   Procedure: THYROIDECTOMY, total;  Surgeon: Fredirick Maudlin, MD;  Location: [facility];  Service: General;  Laterality: N/A;   TONSILLECTOMY     AGE 71 OR 71    Family History  Problem Relation Age of Onset   Diabetes Mother    Heart disease Mother    Emphysema Father    Heart disease Father    COPD Father    Multiple myeloma Brother    Alzheimer's disease Sister    Dementia Sister     Social History   Tobacco Use   Smoking status: Never   Smokeless tobacco: Never  Substance Use Topics   Alcohol use: No    Alcohol/week: 0.0 standard drinks     Current Outpatient Medications:    acetaminophen (TYLENOL) 500 MG tablet, Take 500 mg by mouth every 6 (six) hours as needed., Disp: , Rfl:    Artificial Tear Solution (SYSTANE CONTACTS) SOLN, Apply to eye., Disp: , Rfl:    atorvastatin (LIPITOR) 40 MG tablet, Take 1 tablet (40 mg total) by mouth at bedtime., Disp: 90 tablet, Rfl: 1   benzonatate (TESSALON) 200 MG capsule, Take 1 capsule (200 mg total) by mouth at bedtime., Disp: 90 capsule, Rfl: 1   Biotin w/ Vitamins C & E (HAIR SKIN & NAILS GUMMIES PO), Take 2 tablets by mouth every other day., Disp: , Rfl:    Calcium Carbonate (CALCIUM 600 PO), Take 600 mg by mouth in the morning and at bedtime., Disp: , Rfl:    Cholecalciferol (VITAMIN D) 50 MCG (2000 UT) CAPS, Take 2,000 Units by mouth daily., Disp: , Rfl:    DULoxetine (CYMBALTA) 60 MG capsule, Take 1 capsule (60 mg total) by mouth every morning., Disp: 90 capsule, Rfl: 1   estradiol (ESTRACE) 0.5 MG tablet, Take 1 tablet (0.5 mg total) by mouth daily., Disp: 90 tablet, Rfl: 1   levothyroxine (SYNTHROID) 100 MCG tablet, Take 1 tablet by mouth in the morning. Take 30-60 minutes before  breakfast, Disp: , Rfl:    memantine (NAMENDA) 10 MG tablet, Take 1 tablet by mouth daily., Disp: , Rfl:    traZODone (DESYREL) 50 MG tablet, Take 0.5-1 tablets (25-50 mg total) by mouth at bedtime as needed for sleep., Disp: 90 tablet, Rfl: 1  Allergies  Allergen Reactions   Sulfamethoxazole-Trimethoprim Other (See Comments) and Rash   Citalopram Itching   Omeprazole Palpitations    Also weakness   Tetracycline Other (See Comments)    Other Reaction: URTICARIA   Tetracyclines & Related Itching   Codeine Itching    I personally reviewed active problem list, medication list, allergies, family history, social history,  health maintenance with the patient/caregiver today.   ROS  Constitutional: Negative for fever or weight change.  Respiratory: Negative for cough and shortness of breath.   Cardiovascular: Negative for chest pain or palpitations.  Gastrointestinal: Negative for abdominal pain, no bowel changes.  Musculoskeletal: Negative for gait problem or joint swelling.  Skin: Negative for rash.  Neurological: Negative for dizziness or headache.  No other specific complaints in a complete review of systems (except as listed in HPI above).   Objective  Vitals:   10/23/21 0955  BP: 118/68  Pulse: 82  Resp: 16  SpO2: 96%  Weight: 209 lb (94.8 kg)  Height: '5\' 6"'  (1.676 m)    Body mass index is 33.73 kg/m.  Physical Exam  Constitutional: Patient appears well-developed and well-nourished. Obese  No distress.  HEENT: head atraumatic, normocephalic, pupils equal and reactive to light, neck supple, throat within normal limits Cardiovascular: Normal rate, regular rhythm and normal heart sounds.  No murmur heard. No BLE edema. Pulmonary/Chest: Effort normal and breath sounds normal. No respiratory distress. Abdominal: Soft.  There is no tenderness. Psychiatric: Patient has a normal mood and affect. behavior is normal. Judgment and thought content normal.   PHQ2/9: Depression  screen [facility] 2/9 10/23/2021 09/11/2021 08/06/2021 04/18/2021 01/14/2021  Decreased Interest 2 0 '2 3 1  ' Down, Depressed, Hopeless 1 0 2 3 0  PHQ - 2 Score 3 0 '4 6 1  ' Altered sleeping 1 0 '3 3 1  ' Tired, decreased energy 1 0 '1 1 1  ' Change in appetite 0 0 0 0 0  Feeling bad or failure about yourself  0 0 0 0 0  Trouble concentrating 0 0 1 0 0  Moving slowly or fidgety/restless 0 0 0 0 0  Suicidal thoughts 0 0 0 0 0  PHQ-9 Score 5 0 '9 10 3  ' Difficult doing work/chores - Not difficult at all Not difficult at all - -  Some recent data might be hidden    phq 9 is positive   Fall Risk: Fall Risk  10/23/2021 09/11/2021 08/06/2021 04/18/2021 01/14/2021  Falls in the past year? 0 0 0 0 0  Number falls in past yr: 0 0 0 0 0  Injury with Fall? 0 0 0 0 0  Risk for fall due to : No Fall Risks No Fall Risks No Fall Risks - -  Follow up Falls prevention discussed Falls prevention discussed Falls prevention discussed - -      Functional Status Survey: Is the patient deaf or have difficulty hearing?: No Does the patient have difficulty seeing, even when wearing glasses/contacts?: No Does the patient have difficulty concentrating, remembering, or making decisions?: No Does the patient have difficulty walking or climbing stairs?: No Does the patient have difficulty dressing or bathing?: No Does the patient have difficulty doing errands alone such as visiting a doctor's office or shopping?: No    Assessment & Plan  1. Stage 3a chronic kidney disease (HCC)  - CBC with Differential/Platelet - COMPLETE METABOLIC PANEL WITH GFR  2. Moderate episode of recurrent major depressive disorder (HCC)  - DULoxetine (CYMBALTA) 60 MG capsule; Take 1 capsule (60 mg total) by mouth every morning.  Dispense: 90 capsule; Refill: 1  3. Hypothyroidism, postablative  Continue follow up with Endo   4. Pure hypercholesterolemia  - atorvastatin (LIPITOR) 40 MG tablet; Take 1 tablet (40 mg total) by mouth at bedtime.   Dispense: 90 tablet; Refill: 1 - Lipid panel  5. Mild cognitive  impairment   6. Need for shingles vaccine  - Zoster Vaccine Adjuvanted [facility]) injection; Inject 0.5 mLs into the muscle once for 1 dose.  Dispense: 0.5 mL; Refill: 1  7. Vitamin D deficiency  - Cholecalciferol (VITAMIN D) 50 MCG (2000 UT) CAPS; Take 1 capsule (2,000 Units total) by mouth daily.  Dispense: 100 capsule; Refill: 1 - VITAMIN D 25 Hydroxy (Vit-D Deficiency, Fractures)  8. Primary osteoarthritis of both knees   9. History of thyroidectomy   10. Primary insomnia  - traZODone (DESYREL) 50 MG tablet; Take 0.5-1 tablets (25-50 mg total) by mouth at bedtime as needed for sleep.  Dispense: 90 tablet; Refill: 1  11. Chronic cough   12. B12 deficiency  - Cyanocobalamin (B-12) 500 MCG SUBL; Place 1 tablet under the tongue daily at 12 noon.  Dispense: 100 tablet; Refill: 1 - Vitamin B12

## 2021-10-23 ENCOUNTER — Encounter: Payer: Self-pay | Admitting: Family Medicine

## 2021-10-23 ENCOUNTER — Other Ambulatory Visit: Payer: Self-pay

## 2021-10-23 ENCOUNTER — Ambulatory Visit (INDEPENDENT_AMBULATORY_CARE_PROVIDER_SITE_OTHER): Payer: Medicare Other | Admitting: Family Medicine

## 2021-10-23 VITALS — BP 118/68 | HR 82 | Resp 16 | Ht 66.0 in | Wt 209.0 lb

## 2021-10-23 DIAGNOSIS — E890 Postprocedural hypothyroidism: Secondary | ICD-10-CM | POA: Diagnosis not present

## 2021-10-23 DIAGNOSIS — M170 Bilateral primary osteoarthritis of knee: Secondary | ICD-10-CM

## 2021-10-23 DIAGNOSIS — N1831 Chronic kidney disease, stage 3a: Secondary | ICD-10-CM

## 2021-10-23 DIAGNOSIS — F331 Major depressive disorder, recurrent, moderate: Secondary | ICD-10-CM

## 2021-10-23 DIAGNOSIS — E538 Deficiency of other specified B group vitamins: Secondary | ICD-10-CM | POA: Diagnosis not present

## 2021-10-23 DIAGNOSIS — E7800 Pure hypercholesterolemia, unspecified: Secondary | ICD-10-CM

## 2021-10-23 DIAGNOSIS — G3184 Mild cognitive impairment, so stated: Secondary | ICD-10-CM

## 2021-10-23 DIAGNOSIS — R053 Chronic cough: Secondary | ICD-10-CM

## 2021-10-23 DIAGNOSIS — E559 Vitamin D deficiency, unspecified: Secondary | ICD-10-CM | POA: Diagnosis not present

## 2021-10-23 DIAGNOSIS — Z23 Encounter for immunization: Secondary | ICD-10-CM

## 2021-10-23 DIAGNOSIS — F5101 Primary insomnia: Secondary | ICD-10-CM

## 2021-10-23 LAB — CBC WITH DIFFERENTIAL/PLATELET
Absolute Monocytes: 381 cells/uL (ref 200–950)
Basophils Absolute: 38 cells/uL (ref 0–200)
Basophils Relative: 0.8 %
Eosinophils Absolute: 348 cells/uL (ref 15–500)
Eosinophils Relative: 7.4 %
HCT: 40.5 % (ref 35.0–45.0)
Hemoglobin: 13.2 g/dL (ref 11.7–15.5)
Lymphs Abs: 1382 cells/uL (ref 850–3900)
MCH: 30.7 pg (ref 27.0–33.0)
MCHC: 32.6 g/dL (ref 32.0–36.0)
MCV: 94.2 fL (ref 80.0–100.0)
MPV: 10.1 fL (ref 7.5–12.5)
Monocytes Relative: 8.1 %
Neutro Abs: 2552 cells/uL (ref 1500–7800)
Neutrophils Relative %: 54.3 %
Platelets: 280 10*3/uL (ref 140–400)
RBC: 4.3 10*6/uL (ref 3.80–5.10)
RDW: 12.6 % (ref 11.0–15.0)
Total Lymphocyte: 29.4 %
WBC: 4.7 10*3/uL (ref 3.8–10.8)

## 2021-10-23 LAB — LIPID PANEL
Cholesterol: 133 mg/dL (ref <200)
HDL: 46 mg/dL — ABNORMAL LOW (ref >=50)
LDL Cholesterol (Calc): 74 mg/dL (calc)
Non-HDL Cholesterol (Calc): 87 mg/dL (calc) (ref <130)
Total CHOL/HDL Ratio: 2.9 (calc) (ref <5.0)
Triglycerides: 58 mg/dL (ref <150)

## 2021-10-23 LAB — COMPLETE METABOLIC PANEL WITH GFR
AG Ratio: 1.4 (calc) (ref 1.0–2.5)
ALT: 11 U/L (ref 6–29)
AST: 16 U/L (ref 10–35)
Albumin: 3.9 g/dL (ref 3.6–5.1)
Alkaline phosphatase (APISO): 66 U/L (ref 37–153)
BUN/Creatinine Ratio: 13 (calc) (ref 6–22)
BUN: 14 mg/dL (ref 7–25)
CO2: 32 mmol/L (ref 20–32)
Calcium: 8.8 mg/dL (ref 8.6–10.4)
Chloride: 104 mmol/L (ref 98–110)
Creat: 1.1 mg/dL — ABNORMAL HIGH (ref 0.60–1.00)
Globulin: 2.7 g/dL (calc) (ref 1.9–3.7)
Glucose, Bld: 94 mg/dL (ref 65–99)
Potassium: 4.3 mmol/L (ref 3.5–5.3)
Sodium: 139 mmol/L (ref 135–146)
Total Bilirubin: 0.5 mg/dL (ref 0.2–1.2)
Total Protein: 6.6 g/dL (ref 6.1–8.1)
eGFR: 54 mL/min/{1.73_m2} — ABNORMAL LOW (ref >=60)

## 2021-10-23 LAB — VITAMIN D 25 HYDROXY (VIT D DEFICIENCY, FRACTURES): Vit D, 25-Hydroxy: 37 ng/mL (ref 30–100)

## 2021-10-23 LAB — VITAMIN B12: Vitamin B-12: 327 pg/mL (ref 200–1100)

## 2021-10-23 MED ORDER — SHINGRIX 50 MCG/0.5ML IM SUSR: 0.5000 mL | Freq: Once | INTRAMUSCULAR | 1 refills | Status: AC

## 2021-10-23 MED ORDER — DULOXETINE HCL 60 MG PO CPEP: 60.0000 mg | ORAL_CAPSULE | ORAL | 1 refills | Status: DC

## 2021-10-23 MED ORDER — ATORVASTATIN CALCIUM 40 MG PO TABS: 40.0000 mg | ORAL_TABLET | Freq: Every day | ORAL | 1 refills | Status: DC

## 2021-10-23 MED ORDER — VITAMIN D 50 MCG (2000 UT) PO CAPS: 2000.0000 [IU] | ORAL_CAPSULE | Freq: Every day | ORAL | 1 refills | Status: AC

## 2021-10-23 MED ORDER — TRAZODONE HCL 50 MG PO TABS: 25.0000 mg | ORAL_TABLET | Freq: Every evening | ORAL | 1 refills | Status: DC | PRN

## 2021-10-23 MED ORDER — B-12 500 MCG SL SUBL: 1.0000 | SUBLINGUAL_TABLET | Freq: Every day | SUBLINGUAL | 1 refills | Status: DC

## 2021-10-29 ENCOUNTER — Ambulatory Visit: Payer: Medicare Other | Attending: Neurology

## 2021-10-29 DIAGNOSIS — G4733 Obstructive sleep apnea (adult) (pediatric): Secondary | ICD-10-CM | POA: Insufficient documentation

## 2021-10-29 DIAGNOSIS — R053 Chronic cough: Secondary | ICD-10-CM | POA: Diagnosis not present

## 2021-10-29 DIAGNOSIS — G4709 Other insomnia: Secondary | ICD-10-CM | POA: Diagnosis not present

## 2021-10-30 ENCOUNTER — Other Ambulatory Visit: Payer: Self-pay

## 2021-12-17 NOTE — Progress Notes (Signed)
Name: Lauren Johnston   MRN: [id number]    DOB: 1950/10/28   Date:12/18/2021 ? ?     Progress Note ? ?Subjective ? ?Chief Complaint ? ?Follow Up- Chronic cough ? ?HPI ? ? ?Chronic cough: seen by Dr. Patsey Berthold  also seen by ENT. She had thyroidectomy and symptoms during the day improved, however continues to have nocturnal cough that wakes her up during the night - around 2-4 am, it feels like a tickle in her throat. She never smoked but exposed to second hand smoking . Negative PFT's. Dr. Richardson Landry gave her omeprazole but she was not able to tolerate it - caused her to get sexually aroused and she stopped it. Dr. Duwayne Heck gave Zyrtec, we will add a nasal spray today  ? ?OA both knees: stable, taking tylenol prn now, no effusion or redness. It used to be a soreness that was not affecting her qualify of life however over the past couple of months she noticed increase in pain on left anterior knee and instability, she has been wearing a soft brace advised referral to Ortho, she does not know who did her arthroscopy surgery    ?  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 Manuella Ghazi, only able to tolerate Namenda 10 mg but only once a day - advised to discuss it with Dr. Manuella Ghazi . She is getting frustrated, she states husband getting worried, today her husband had to drive her here because she forgot where our office was located, she is still able to cook, she has forgotten to pay bills at times, worse symptom is difficulty with directions  She should have a follow up soon . There is family history of dementia, mother and multiple siblings. She states her symptoms worse after COVID. Sleep study was done but we will try to get results  ? ?Patient Active Problem List  ? Diagnosis Date Noted  ? Chronic cough 09/11/2021  ? History of colonic polyps   ? 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 Pure hypercholesterolemia 10/15/2020  ? Stage 3a chronic kidney disease ([place]) 10/15/2020  ? Memory changes 10/15/2020  ? S/P  total thyroidectomy 06/18/2020  ? Class 1 obesity in adult 11/11/2016  ? Osteopenia 10/09/2016  ? Special screening for malignant neoplasms, colon   ? Rectal polyp   ? Right wrist tendonitis 05/16/2016  ? Osteoarthritis of left hip 05/16/2016  ? Vitamin D deficiency 02/05/2016  ? Degeneration of intervertebral disc of cervical region 02/05/2016  ? Insomnia 04/23/2015  ? Major depression in remission ([place]) 04/23/2015  ? Chronic pain 04/23/2015  ? Goiter, nontoxic, 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  ? 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 Uterine leiomyoma 06/16/2007  ? ? ?Past Surgical History:  ?Procedure Laterality Date  ? ABDOMINAL HYSTERECTOMY    ? BILATERAL OOPHORECTOMY    ? COLONOSCOPY WITH PROPOFOL N/A 06/02/2016  ? Procedure: COLONOSCOPY WITH PROPOFOL;  Surgeon: Lucilla Lame, MD;  Location: [facility];  Service: Endoscopy;  Laterality: N/A;  ? COLONOSCOPY WITH PROPOFOL N/A 06/21/2021  ? Procedure: COLONOSCOPY WITH PROPOFOL;  Surgeon: Lucilla Lame, MD;  Location: [place];  Service: Endoscopy;  Laterality: N/A;  ? dequavian tendonitis Left   ? KNEE ARTHROSCOPY Bilateral   ? POLYPECTOMY  06/02/2016  ? Procedure: POLYPECTOMY;  Surgeon: Lucilla Lame, MD;  Location: [place];  Service: Endoscopy;;  ? THYROIDECTOMY N/A 06/18/2020  ? Procedure: THYROIDECTOMY, total;  Surgeon: Fredirick Maudlin, MD;  Location: [facility];  Service: General;  Laterality: N/A;  ? TONSILLECTOMY    ?  AGE 71 OR 11  ? ? ?Family History  ?Problem Relation Age of Onset  ? Diabetes Mother   ? Heart disease Mother   ? Emphysema Father   ? Heart disease Father   ? COPD Father   ? Multiple myeloma Brother   ? Alzheimer's disease Sister   ? Dementia Sister   ? ? ?Social History  ? ?Tobacco Use  ? Smoking status: Never  ? Smokeless tobacco: Never  ?Substance Use Topics  ? Alcohol use: No  ?  Alcohol/week: 0.0 standard drinks  ? ? ? ?Current Outpatient Medications:  ?  acetaminophen (TYLENOL) 500 MG tablet, Take 500 mg by mouth  every 6 (six) hours as needed., Disp: , Rfl:  ?  Artificial Tear Solution (SYSTANE CONTACTS) SOLN, Apply to eye., Disp: , Rfl:  ?  atorvastatin (LIPITOR) 40 MG tablet, Take 1 tablet (40 mg total) by mouth at bedtime., Disp: 90 tablet, Rfl: 1 ?  benzonatate (TESSALON) 200 MG capsule, Take 1 capsule (200 mg total) by mouth at bedtime., Disp: 90 capsule, Rfl: 1 ?  Biotin w/ Vitamins C & E (HAIR SKIN & NAILS GUMMIES PO), Take 2 tablets by mouth every other day., Disp: , Rfl:  ?  Calcium Carbonate (CALCIUM 600 PO), Take 600 mg by mouth in the morning and at bedtime., Disp: , Rfl:  ?  Cholecalciferol (VITAMIN D) 50 MCG (2000 UT) CAPS, Take 1 capsule (2,000 Units total) by mouth daily., Disp: 100 capsule, Rfl: 1 ?  Cyanocobalamin (B-12) 500 MCG SUBL, Place 1 tablet under the tongue daily at 12 noon., Disp: 100 tablet, Rfl: 1 ?  DULoxetine (CYMBALTA) 60 MG capsule, Take 1 capsule (60 mg total) by mouth every morning., Disp: 90 capsule, Rfl: 1 ?  estradiol (ESTRACE) 0.5 MG tablet, Take 1 tablet (0.5 mg total) by mouth daily., Disp: 90 tablet, Rfl: 1 ?  levothyroxine (SYNTHROID) 100 MCG tablet, Take 1 tablet by mouth in the morning. Take 30-60 minutes before breakfast, Disp: , Rfl:  ?  memantine (NAMENDA) 10 MG tablet, Take 1 tablet by mouth daily., Disp: , Rfl:  ?  traZODone (DESYREL) 50 MG tablet, Take 0.5-1 tablets (25-50 mg total) by mouth at bedtime as needed for sleep., Disp: 90 tablet, Rfl: 1 ? ?Allergies  ?Allergen Reactions  ? Sulfamethoxazole-Trimethoprim Other (See Comments) and Rash  ? Citalopram Itching  ? Omeprazole Palpitations  ?  Also weakness  ? Tetracycline Other (See Comments)  ?  Other Reaction: URTICARIA  ? Tetracyclines & Related Itching  ? Codeine Itching  ? ? ?I personally reviewed active problem list, medication list, allergies, family history, social history, health maintenance with the patient/caregiver today. ? ? ?ROS ? ?Constitutional: Negative for fever or weight change.  ?Respiratory: Negative  for cough and shortness of breath.   ?Cardiovascular: Negative for chest pain or palpitations.  ?Gastrointestinal: Negative for abdominal pain, no bowel changes.  ?Musculoskeletal: positive for gait problem but no  joint swelling.  ?Skin: Negative for rash.  ?Neurological: Negative for dizziness or headache.  ?No other specific complaints in a complete review of systems (except as listed in HPI above).  ? ?Objective ? ?Vitals:  ? 12/18/21 1034  ?BP: 118/72  ?Pulse: 82  ?Resp: 16  ?SpO2: 98%  ?Weight: 209 lb (94.8 kg)  ?Height: '5\' 7"'  (1.702 m)  ? ? ?Body mass index is 32.73 kg/m?. ? ?Physical Exam ? ?Constitutional: Patient appears well-developed and well-nourished. Obese  No distress.  ?HEENT: head atraumatic, normocephalic, pupils equal  and reactive to light, neck supple ?Cardiovascular: Normal rate, regular rhythm and normal heart sounds.  No murmur heard. No BLE edema. ?Pulmonary/Chest: Effort normal and breath sounds normal. No respiratory distress. ?Abdominal: Soft.  There is no tenderness. ?Muscular Skeletal: crepitus with extension of both knees  ?Psychiatric: Patient has a normal mood and affect. behavior is normal. Judgment and thought content normal.  ? ?Recent Results (from the past 2160 hour(s))  ?VITAMIN D 25 Hydroxy (Vit-D Deficiency, Fractures)     Status: None  ? Collection Time: 10/23/21 10:44 AM  ?Result Value Ref Range  ? Vit D, 25-Hydroxy 37 30 - 100 ng/mL  ?  Comment: Vitamin D Status         25-OH Vitamin D: ?. ?Deficiency:                    <20 ng/mL ?Insufficiency:             20 - 29 ng/mL ?Optimal:                 > or = 30 ng/mL ?Marland Kitchen ?For 25-OH Vitamin D testing on patients on  ?D2-supplementation and patients for whom quantitation  ?of D2 and D3 fractions is required, the [facility](TM) ?25-OH VIT D, (D2,D3), LC/MS/MS is recommended: order  ?code [phone] (patients >84yr). ?See Note 1 ?. ?Note 1 ?. ?For additional information, please refer to   ?http://education.[facility].com/faq/FAQ199  ?(This link is being provided for informational/ ?educational purposes only.) ?  ?Vitamin B12     Status: None  ? Collection Time: 10/23/21 10:44 AM  ?Result Value Ref Range  ? Vitamin B-12 327 20

## 2021-12-18 ENCOUNTER — Ambulatory Visit (INDEPENDENT_AMBULATORY_CARE_PROVIDER_SITE_OTHER): Payer: Medicare Other | Admitting: Family Medicine

## 2021-12-18 ENCOUNTER — Encounter: Payer: Self-pay | Admitting: Family Medicine

## 2021-12-18 VITALS — BP 118/72 | HR 82 | Resp 16 | Ht 67.0 in | Wt 209.0 lb

## 2021-12-18 DIAGNOSIS — G3184 Mild cognitive impairment, so stated: Secondary | ICD-10-CM | POA: Diagnosis not present

## 2021-12-18 DIAGNOSIS — M25562 Pain in left knee: Secondary | ICD-10-CM

## 2021-12-18 DIAGNOSIS — R053 Chronic cough: Secondary | ICD-10-CM | POA: Diagnosis not present

## 2021-12-18 DIAGNOSIS — M170 Bilateral primary osteoarthritis of knee: Secondary | ICD-10-CM | POA: Diagnosis not present

## 2021-12-18 MED ORDER — FLUTICASONE PROPIONATE 50 MCG/ACT NA SUSP: 2.0000 | Freq: Every day | NASAL | 1 refills | Status: DC

## 2021-12-18 MED ORDER — DICLOFENAC SODIUM 1 % EX GEL: 4.0000 g | Freq: Four times a day (QID) | CUTANEOUS | 1 refills | Status: DC

## 2021-12-18 NOTE — Patient Instructions (Signed)
Please contact Dr. Manuella Ghazi - neurologist for follow up ?

## 2021-12-19 ENCOUNTER — Encounter: Payer: Self-pay | Admitting: Family Medicine

## 2021-12-19 DIAGNOSIS — M2242 Chondromalacia patellae, left knee: Secondary | ICD-10-CM | POA: Diagnosis not present

## 2021-12-19 DIAGNOSIS — M1712 Unilateral primary osteoarthritis, left knee: Secondary | ICD-10-CM | POA: Diagnosis not present

## 2022-01-06 DIAGNOSIS — F411 Generalized anxiety disorder: Secondary | ICD-10-CM | POA: Diagnosis not present

## 2022-01-06 DIAGNOSIS — G3184 Mild cognitive impairment, so stated: Secondary | ICD-10-CM | POA: Diagnosis not present

## 2022-01-07 DIAGNOSIS — M6281 Muscle weakness (generalized): Secondary | ICD-10-CM | POA: Insufficient documentation

## 2022-01-07 DIAGNOSIS — M25562 Pain in left knee: Secondary | ICD-10-CM | POA: Diagnosis not present

## 2022-01-08 DIAGNOSIS — M2242 Chondromalacia patellae, left knee: Secondary | ICD-10-CM | POA: Insufficient documentation

## 2022-02-12 DIAGNOSIS — E890 Postprocedural hypothyroidism: Secondary | ICD-10-CM | POA: Diagnosis not present

## 2022-02-18 NOTE — Progress Notes (Unsigned)
Name: Lauren Johnston   MRN: [id number]    DOB: 1951-02-27   Date:02/19/2022       Progress Note  Subjective  Chief Complaint  Follow Up  HPI  Chronic cough: seen by Dr. Patsey Berthold  also seen by ENT. She had thyroidectomy and symptoms during the day improved, however continues to have nocturnal cough that wakes her up during the night - around 2-4 am, it feels like a tickle in her throat. She never smoked but exposed to second hand smoking . Negative PFT's. Dr. Richardson Landry gave her omeprazole but she was not able to tolerate it - caused her to get sexually aroused and she stopped it. Dr. Duwayne Heck gave Zyrtec, we added Flonase and she also states Benzonate helps with cough and she would like a refill   OA both knees: stable, taking tylenol prn now, no effusion or redness. She has been wearing a soft brace advised referral to Ortho, symptoms are stable    Mild Cognitive Dysfunction: seeing Dr. Manuella Ghazi, only able to tolerate Namenda 10 mg but only once a day - advised to discuss it with Dr. Manuella Ghazi . She states she drove here today on her own, sometimes she is afraid to drive because she is afraid she will get lost, she is still able to cook, she has forgotten to pay bills at times There is family history of dementia, mother and multiple siblings. She states her symptoms worse after COVID. Stable.    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 Celine Ahr 04/12/2020 . She has a thyroidectomy 06/18/2020 , last TSH done by endo still suppressed and she is down to 100 mcg daily. She has regular follow ups with Endo Last TSH done 12/2021 and stable.   Hyperlipidemia: taking Atorvastatin , reviewed last labs , LDL was 74 reminded her to eat more fish and continue  tree nuts daily     Insomnia she takes trazodone and seems to help her to  fall and stay asleep   CKI stage III: discussed avoiding NSAID's and drink more water, taking Tylenol prn Last GFR stable at 54   She denies pruritis or  decrease , no problems voiding.    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 She still feels it is affecting her memory and has mental fogginess . She states her husband and girlfriends helps her with directions now. She saw Dr/ Manuella Ghazi and was diagnosed with mild cognitive dysfunction , she is now on Namenda 10 mg BID   B12 deficiency: last level back to normal, continue supplementation   Major Depression: She is on her third marriage, first marriage was not good, her twins were 7 when he left them, second marriage was good but he died when she was 71 yo, she is on 37 [address] marriage and together for the past few years, they met during a high school reunion. She continues to feel upset about her memory, she sates her children don't understand it Husband is supportive . She is taking Duloxetein. 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   Muscle weakness 01/07/2022   Chronic cough 09/11/2021   History of colonic polyps    COVID-19  long hauler manifesting chronic loss of taste 10/15/2020   Pure hypercholesterolemia 10/15/2020   Stage 3a chronic kidney disease ([place]) 10/15/2020   Memory changes 10/15/2020   S/P total thyroidectomy 06/18/2020   Class 1 obesity in adult 11/11/2016   Osteopenia 10/09/2016   Special screening for malignant neoplasms, colon    Rectal polyp    Right wrist tendonitis 05/16/2016   Osteoarthritis of left hip 05/16/2016   Vitamin D deficiency 02/05/2016   Degeneration of intervertebral disc of cervical region 02/05/2016   Insomnia 04/23/2015   Major depression in remission ([place]) 04/23/2015   Chronic pain 04/23/2015   Goiter, nontoxic, 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   Hypothyroidism, 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   Uterine  leiomyoma 06/16/2007    Past Surgical History:  Procedure Laterality Date   ABDOMINAL HYSTERECTOMY     BILATERAL OOPHORECTOMY     COLONOSCOPY WITH PROPOFOL N/A 06/02/2016   Procedure: COLONOSCOPY WITH PROPOFOL;  Surgeon: Lucilla Lame, MD;  Location: [place];  Service: Endoscopy;  Laterality: N/A;   COLONOSCOPY WITH PROPOFOL N/A 06/21/2021   Procedure: COLONOSCOPY WITH PROPOFOL;  Surgeon: Lucilla Lame, MD;  Location: [place];  Service: Endoscopy;  Laterality: N/A;   dequavian tendonitis Left    KNEE ARTHROSCOPY Bilateral    POLYPECTOMY  06/02/2016   Procedure: POLYPECTOMY;  Surgeon: Lucilla Lame, MD;  Location: [place];  Service: Endoscopy;;   THYROIDECTOMY N/A 06/18/2020   Procedure: THYROIDECTOMY, total;  Surgeon: Fredirick Maudlin, MD;  Location: [facility];  Service: General;  Laterality: N/A;   TONSILLECTOMY     AGE 23 OR 1    Family History  Problem Relation Age of Onset   Diabetes Mother    Heart disease Mother    Emphysema Father    Heart disease Father    COPD Father    Multiple myeloma Brother    Alzheimer's disease Sister    Dementia Sister     Social History   Tobacco Use   Smoking status: Never   Smokeless tobacco: Never  Substance Use Topics   Alcohol use: No    Alcohol/week: 0.0 standard drinks of alcohol     Current Outpatient Medications:    acetaminophen (TYLENOL) 500 MG tablet, Take 500 mg by mouth every 6 (six) hours as needed., Disp: , Rfl:    Artificial Tear Solution (SYSTANE CONTACTS) SOLN, Apply to eye., Disp: , Rfl:    atorvastatin (LIPITOR) 40 MG tablet, Take 1 tablet (40 mg total) by mouth at bedtime., Disp: 90 tablet, Rfl: 1   Biotin w/ Vitamins C & E (HAIR SKIN & NAILS GUMMIES PO), Take 2 tablets by mouth every other day., Disp: , Rfl:    Cholecalciferol (VITAMIN D) 50 MCG (2000 UT) CAPS, Take 1 capsule (2,000 Units total) by mouth daily., Disp: 100 capsule, Rfl: 1   Cyanocobalamin (B-12) 500 MCG SUBL, Place 1  tablet under the tongue daily at 12 noon., Disp: 100 tablet, Rfl: 1   diclofenac Sodium (VOLTAREN) 1 % GEL, Apply 4 g topically 4 (four) times daily., Disp: 100 g, Rfl: 1   DULoxetine (CYMBALTA) 60 MG capsule, Take 1 capsule (60 mg total) by mouth every morning., Disp: 90 capsule, Rfl: 1   estradiol (ESTRACE) 0.5 MG tablet, Take 1 tablet (0.5 mg total) by mouth daily., Disp: 90 tablet, Rfl: 1   fluticasone (FLONASE) 50 MCG/ACT nasal spray, Place 2 sprays into both nostrils daily., Disp: 48 g, Rfl: 1   levothyroxine (SYNTHROID) 100 MCG tablet, Take 1 tablet by mouth  in the morning. Take 30-60 minutes before breakfast, Disp: , Rfl:    memantine (NAMENDA) 10 MG tablet, Take 1 tablet by mouth daily., Disp: , Rfl:    traZODone (DESYREL) 50 MG tablet, Take 0.5-1 tablets (25-50 mg total) by mouth at bedtime as needed for sleep., Disp: 90 tablet, Rfl: 1   Calcium Carbonate (CALCIUM 600 PO), Take 600 mg by mouth in the morning and at bedtime. (Patient not taking: Reported on 02/19/2022), Disp: , Rfl:   Allergies  Allergen Reactions   Sulfamethoxazole-Trimethoprim Other (See Comments) and Rash   Citalopram Itching   Omeprazole Palpitations    Also weakness   Tetracycline Other (See Comments)    Other Reaction: URTICARIA   Tetracyclines & Related Itching   Codeine Itching    I personally reviewed active problem list, medication list, allergies, family history, social history, health maintenance with the patient/caregiver today.   ROS  Constitutional: Negative for fever or weight change.  Respiratory: Positive for cough but no shortness of breath.   Cardiovascular: Negative for chest pain or palpitations.  Gastrointestinal: Negative for abdominal pain, no bowel changes.  Musculoskeletal: Negative for gait problem or joint swelling.  Skin: Negative for rash.  Neurological: Negative for dizziness or headache.  No other specific complaints in a complete review of systems (except as listed in HPI  above).   Objective  Vitals:   02/19/22 0933  BP: 128/74  Pulse: 85  Resp: 16  SpO2: 98%  Weight: 204 lb (92.5 kg)  Height: '5\' 6"'  (1.676 m)    Body mass index is 32.93 kg/m.  Physical Exam  Constitutional: Patient appears well-developed and well-nourished. Obese No distress.  HEENT: head atraumatic, normocephalic, pupils equal and reactive to light, neck supple Cardiovascular: Normal rate, regular rhythm and normal heart sounds.  No murmur heard. No BLE edema. Pulmonary/Chest: Effort normal and breath sounds normal. No respiratory distress. Abdominal: Soft.  There is no tenderness. Psychiatric: Patient has a normal mood and affect. behavior is normal. Judgment and thought content normal.  Skin: moles on right side of forehead   PHQ2/9:    02/19/2022    9:32 AM 12/18/2021   10:31 AM 10/23/2021    9:54 AM 09/11/2021    8:49 AM 08/06/2021   10:51 AM  Depression screen PHQ 2/9  Decreased Interest '1 2 2 ' 0 2  Down, Depressed, Hopeless 0 1 1 0 2  PHQ - 2 Score '1 3 3 ' 0 4  Altered sleeping 0 0 1 0 3  Tired, decreased energy 3 0 1 0 1  Change in appetite 0 0 0 0 0  Feeling bad or failure about yourself  0 0 0 0 0  Trouble concentrating 0 1 0 0 1  Moving slowly or fidgety/restless 0 0 0 0 0  Suicidal thoughts 0 0 0 0 0  PHQ-9 Score '4 4 5 ' 0 9  Difficult doing work/chores    Not difficult at all Not difficult at all    phq 9 is positive   Fall Risk:    02/19/2022    9:32 AM 12/18/2021   10:30 AM 10/23/2021    9:54 AM 09/11/2021    8:49 AM 08/06/2021   10:55 AM  Fall Risk   Falls in the past year? 0 0 0 0 0  Number falls in past yr: 0 0 0 0 0  Injury with Fall? 0 0 0 0 0  Risk for fall due to : No Fall Risks No Fall Risks  No Fall Risks No Fall Risks No Fall Risks  Follow up Falls prevention discussed Falls prevention discussed Falls prevention discussed Falls prevention discussed Falls prevention discussed      Functional Status Survey: Is the patient deaf or have  difficulty hearing?: No Does the patient have difficulty seeing, even when wearing glasses/contacts?: No Does the patient have difficulty concentrating, remembering, or making decisions?: Yes Does the patient have difficulty walking or climbing stairs?: No Does the patient have difficulty dressing or bathing?: No Does the patient have difficulty doing errands alone such as visiting a doctor's office or shopping?: No    Assessment & Plan  1. Change in facial mole  - Ambulatory referral to Dermatology  2. Pure hypercholesterolemia  - atorvastatin (LIPITOR) 40 MG tablet; Take 1 tablet (40 mg total) by mouth at bedtime.  Dispense: 90 tablet; Refill: 1  3. Moderate episode of recurrent major depressive disorder (HCC)  - DULoxetine (CYMBALTA) 60 MG capsule; Take 1 capsule (60 mg total) by mouth every morning.  Dispense: 90 capsule; Refill: 1  4. Chronic cough  - benzonatate (TESSALON) 200 MG capsule; Take 1 capsule (200 mg total) by mouth at bedtime.  Dispense: 90 capsule; Refill: 1  5. Primary insomnia  - traZODone (DESYREL) 50 MG tablet; Take 0.5-1 tablets (25-50 mg total) by mouth at bedtime as needed for sleep.  Dispense: 90 tablet; Refill: 1  6. B12 deficiency  - Cyanocobalamin (B-12) 500 MCG SUBL; Place 1 tablet under the tongue daily at 12 noon.  Dispense: 100 tablet; Refill: 1    7. Stage 3a chronic kidney disease (HCC)  Stable   8. Hypothyroidism, postablative  Keep follow up with Endo  9. Goiter, nontoxic, multinodular

## 2022-02-19 ENCOUNTER — Ambulatory Visit (INDEPENDENT_AMBULATORY_CARE_PROVIDER_SITE_OTHER): Payer: Medicare Other | Admitting: Family Medicine

## 2022-02-19 ENCOUNTER — Encounter: Payer: Self-pay | Admitting: Family Medicine

## 2022-02-19 VITALS — BP 128/74 | HR 85 | Resp 16 | Ht 66.0 in | Wt 204.0 lb

## 2022-02-19 DIAGNOSIS — D2230 Melanocytic nevi of unspecified part of face: Secondary | ICD-10-CM | POA: Diagnosis not present

## 2022-02-19 DIAGNOSIS — R053 Chronic cough: Secondary | ICD-10-CM

## 2022-02-19 DIAGNOSIS — E042 Nontoxic multinodular goiter: Secondary | ICD-10-CM

## 2022-02-19 DIAGNOSIS — F5101 Primary insomnia: Secondary | ICD-10-CM

## 2022-02-19 DIAGNOSIS — F331 Major depressive disorder, recurrent, moderate: Secondary | ICD-10-CM | POA: Diagnosis not present

## 2022-02-19 DIAGNOSIS — E890 Postprocedural hypothyroidism: Secondary | ICD-10-CM

## 2022-02-19 DIAGNOSIS — E538 Deficiency of other specified B group vitamins: Secondary | ICD-10-CM

## 2022-02-19 DIAGNOSIS — E7800 Pure hypercholesterolemia, unspecified: Secondary | ICD-10-CM | POA: Diagnosis not present

## 2022-02-19 DIAGNOSIS — N1831 Chronic kidney disease, stage 3a: Secondary | ICD-10-CM

## 2022-02-19 MED ORDER — ATORVASTATIN CALCIUM 40 MG PO TABS: 40.0000 mg | ORAL_TABLET | Freq: Every day | ORAL | 1 refills | Status: DC

## 2022-02-19 MED ORDER — BENZONATATE 200 MG PO CAPS: 200.0000 mg | ORAL_CAPSULE | Freq: Every evening | ORAL | 1 refills | Status: DC

## 2022-02-19 MED ORDER — B-12 500 MCG SL SUBL: 1.0000 | SUBLINGUAL_TABLET | Freq: Every day | SUBLINGUAL | 1 refills | Status: AC

## 2022-02-19 MED ORDER — TRAZODONE HCL 50 MG PO TABS: 25.0000 mg | ORAL_TABLET | Freq: Every evening | ORAL | 1 refills | Status: DC | PRN

## 2022-02-19 MED ORDER — DULOXETINE HCL 60 MG PO CPEP: 60.0000 mg | ORAL_CAPSULE | ORAL | 1 refills | Status: DC

## 2022-03-07 ENCOUNTER — Other Ambulatory Visit: Payer: Self-pay | Admitting: Family Medicine

## 2022-03-07 DIAGNOSIS — F331 Major depressive disorder, recurrent, moderate: Secondary | ICD-10-CM

## 2022-03-07 NOTE — Telephone Encounter (Signed)
Requested medication (s) are due for refill today: yes  Requested medication (s) are on the active medication list: yes  Last refill:  08/01/21  Future visit scheduled: yes  Notes to clinic:  historical med. Please advise     Requested Prescriptions  Pending Prescriptions Disp Refills   levothyroxine (SYNTHROID) 100 MCG tablet 30 tablet 11    Sig: Take 1 tablet (100 mcg total) by mouth in the morning. Take 30-60 minutes before breakfast     Endocrinology:  Hypothyroid Agents Failed - 03/07/2022  1:53 PM      Failed - TSH in normal range and within 360 days    TSH  Date Value Ref Range Status  10/15/2020 0.01 (L) 0.40 - 4.50 mIU/L Final         Passed - Valid encounter within last 12 months    Recent Outpatient Visits           2 weeks ago Change in facial mole   [facility] Steele Sizer, MD   2 months ago Chronic cough   [facility] Steele Sizer, MD   4 months ago Stage 3a chronic kidney disease [facility])   [facility] Steele Sizer, MD   5 months ago Chronic cough   [facility] Mecum, Erin E, PA-C   10 months ago Moderate episode of recurrent major depressive disorder [facility])   [facility] Steele Sizer, MD       Future Appointments             In 3 months Ancil Boozer, Drue Stager, MD [facility], [place]   In 5 months  [facility], [place]   In 5 months Steele Sizer, MD [facility], [place]   In 5 months Ralene Bathe, MD [place]            Refused Prescriptions Disp Refills   DULoxetine (CYMBALTA) 60 MG capsule 90 capsule 1    Sig: Take 1 capsule (60 mg total) by mouth every morning.     Psychiatry: Antidepressants - SNRI - duloxetine Failed - 03/07/2022  1:53 PM      Failed - Cr in normal range and within 360 days    Creat  Date Value Ref Range Status  10/23/2021 1.10 (H) 0.60 - 1.00 mg/dL  Final         Passed - eGFR is 30 or above and within 360 days    GFR, Est African American  Date Value Ref Range Status  10/15/2020 71 > OR = 60 mL/min/1.23m Final   GFR, Est Non African American  Date Value Ref Range Status  10/15/2020 61 > OR = 60 mL/min/1.767mFinal   eGFR  Date Value Ref Range Status  10/23/2021 54 (L) > OR = 60 mL/min/1.7370minal    Comment:    The eGFR is based on the CKD-EPI 2021 equation. To calculate  the new eGFR from a previous Creatinine or Cystatin C result, go to https://www.kidney.org/professionals/ kdoqi/gfr%5Fcalculator          Passed - Completed PHQ-2 or PHQ-9 in the last 360 days      Passed - Last BP in normal range    BP Readings from Last 1 Encounters:  02/19/22 128/74         Passed - Valid encounter within last 6 months    Recent Outpatient Visits  2 weeks ago Change in facial mole   [facility] Steele Sizer, MD   2 months ago Chronic cough   [facility] Steele Sizer, MD   4 months ago Stage 3a chronic kidney disease [facility])   [facility] Steele Sizer, MD   5 months ago Chronic cough   [facility] Mecum, Dani Gobble, PA-C   10 months ago Moderate episode of recurrent major depressive disorder [facility])   [facility] Steele Sizer, MD       Future Appointments             In 3 months Ancil Boozer, Drue Stager, MD [facility], [place]   In 5 months  [facility], [place]   In 5 months Steele Sizer, MD [facility], [place]   In 5 months Ralene Bathe, MD [place]

## 2022-03-07 NOTE — Telephone Encounter (Signed)
rx was sent to pharmacy on 02/19/22 #90/1 Requested Prescriptions  Pending Prescriptions Disp Refills  . DULoxetine (CYMBALTA) 60 MG capsule 90 capsule 1    Sig: Take 1 capsule (60 mg total) by mouth every morning.     Psychiatry: Antidepressants - SNRI - duloxetine Failed - 03/07/2022  1:53 PM      Failed - Cr in normal range and within 360 days    Creat  Date Value Ref Range Status  10/23/2021 1.10 (H) 0.60 - 1.00 mg/dL Final         Passed - eGFR is 30 or above and within 360 days    GFR, Est African American  Date Value Ref Range Status  10/15/2020 71 > OR = 60 mL/min/1.5m Final   GFR, Est Non African American  Date Value Ref Range Status  10/15/2020 61 > OR = 60 mL/min/1.741mFinal   eGFR  Date Value Ref Range Status  10/23/2021 54 (L) > OR = 60 mL/min/1.7356minal    Comment:    The eGFR is based on the CKD-EPI 2021 equation. To calculate  the new eGFR from a previous Creatinine or Cystatin C result, go to https://www.kidney.org/professionals/ kdoqi/gfr%5Fcalculator          Passed - Completed PHQ-2 or PHQ-9 in the last 360 days      Passed - Last BP in normal range    BP Readings from Last 1 Encounters:  02/19/22 128/74         Passed - Valid encounter within last 6 months    Recent Outpatient Visits          2 weeks ago [facility]   2 months ago [facility] cough   [facility]   4 months ago Stage [facility]   5 months ago Chronic cough   [facility]   10 months ago Moderate episode of recurrent major depressive disorder [facility]      Future Appointments            In 3 months SowAncil BoozerriDrue StagerD [facility] months  [facility]  5 months SowSteele SizerD [facility] 5 months KowRalene BatheD [place]        . levothyroxine (SYNTHROID) 100 MCG tablet 30 tablet 11    Sig: Take 1 tablet (100 mcg total) by mouth in the morning. Take 30-60 minutes before breakfast     Endocrinology:  Hypothyroid Agents Failed - 03/07/2022  1:53 PM      Failed - TSH in normal range and within 360 days    TSH  Date Value Ref Range Status  10/15/2020 0.01 (L) 0.40 - 4.50 mIU/L Final         Passed - Valid encounter within last 12 months    Recent Outpatient Visits          2 weeks ago Change in facial mole   [facility]   2 months ago Chronic cough   [facility]   4 months ago Stage 3a chronic kidney disease ([facility] [place]  [facility] Steele Sizer, MD   5 months ago Chronic cough   [facility] Mecum, Dani Gobble, PA-C   10 months ago Moderate episode of recurrent major depressive disorder [facility])   [facility] Steele Sizer, MD      Future Appointments            In 3 months Ancil Boozer, Drue Stager, MD [facility], [place]   In 5 months  [facility], [place]   In 5 months Steele Sizer, MD [facility], [place]   In 5 months Ralene Bathe, MD [place]

## 2022-03-07 NOTE — Telephone Encounter (Signed)
Medication Refill - Medication: levothyroxine (SYNTHROID) 100 MCG tablet [[id number]]   DULoxetine (CYMBALTA) 60 MG capsule [[id number]]   Has the patient contacted their pharmacy? Yes.   (Agent: If no, request that the patient contact the pharmacy for the refill. If patient does not wish to contact the pharmacy document the reason why and proceed with request.) (Agent: If yes, when and what did the pharmacy advise?)  Preferred Pharmacy (with phone number or street name):  [id number] [place]  [address] Phone: [phone]: [phone] Hours: Not open 24 hours   Has the patient been seen for an appointment in the last year OR does the patient have an upcoming appointment? Yes.    Agent: Please be advised that RX refills may take up to 3 business days. We ask that you follow-up with your pharmacy.

## 2022-06-09 ENCOUNTER — Ambulatory Visit
Admission: RE | Admit: 2022-06-09 | Discharge: 2022-06-09 | Disposition: A | Discharge status: Home / Self Care | Payer: Medicare Other | Source: Ambulatory Visit | Attending: Family Medicine | Admitting: Family Medicine

## 2022-06-09 DIAGNOSIS — Z1231 Encounter for screening mammogram for malignant neoplasm of breast: Secondary | ICD-10-CM | POA: Insufficient documentation

## 2022-06-09 DIAGNOSIS — Z780 Asymptomatic menopausal state: Secondary | ICD-10-CM

## 2022-06-09 DIAGNOSIS — M85852 Other specified disorders of bone density and structure, left thigh: Secondary | ICD-10-CM | POA: Diagnosis not present

## 2022-06-24 NOTE — Progress Notes (Unsigned)
Name: Lauren Johnston   MRN: [id number]    DOB: 02/05/1951   Date:06/25/2022       Progress Note  Subjective  Chief Complaint  Follow Up  HPI  Chronic cough: seen by Dr. Patsey Berthold  also seen by ENT. She had thyroidectomy and symptoms during the day improved, however continues to have nocturnal cough that wakes her up during the night - around 2-4 am, it feels like a tickle in her throat. She never smoked but exposed to second hand smoking . Negative PFT's. Dr. Richardson Landry gave her omeprazole but she was not able to tolerate it - caused her to get sexually aroused and she stopped it. Dr. Duwayne Heck gave Zyrtec, we added Flonase and she also states Benzonate helps with cough , advised to consider mucinex if needed   OA both knees: stable, taking tylenol prn now, no effusion or redness. She has been wearing a soft brace , we will refer her Ortho   Mild Cognitive Dysfunction: seeing Dr. Manuella Ghazi, only able to tolerate Namenda 10 mg bin am and half at night as prescribed now. She still drives but sometimes feels insecure to leave her house alone. There is family history of dementia, mother and multiple siblings. She states her symptoms worse after COVID. 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 Celine Ahr 04/12/2020 . She has a thyroidectomy 06/18/2020 , last TSH done by endo was at goal    Hyperlipidemia: taking Atorvastatin , reviewed last labs , LDL was 74 reminded her to eat more fish and continue  tree nuts daily  Recheck levels yearly    Insomnia she takes trazodone and seems to help her to  fall and stay asleep No side effects   CKI stage III: discussed avoiding NSAID's and drink more water, taking Tylenol prn Last GFR stable at 54   She denies pruritis or decrease , no problems voiding. 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 She still feels it is affecting her memory and has mental fogginess . She states her husband and girlfriends helps her with directions now. She saw Dr/ Manuella Ghazi and was diagnosed with mild cognitive dysfunction , she is now on Namenda 10 mg in am and half in pm. Symptoms are stable.   B12 deficiency: last level back to normal, continue supplementation   Major Depression: She is on her third marriage, first marriage was not good, her twins were 7 when he left them, second marriage was good but he died when she was 71 yo, she is on 75 [address] marriage and together for the past few years, they met during a high school reunion. She continues to feel upset about her memory, she sates her children don't understand it Husband is supportive . She is taking Duloxetein. 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 10/15/2020   Pure hypercholesterolemia 10/15/2020   Stage 3a chronic kidney disease ([place]) 10/15/2020  S/P total thyroidectomy 06/18/2020   Class 1 obesity in adult 11/11/2016   Osteopenia 10/09/2016   Rectal polyp    Right wrist tendonitis 05/16/2016   Osteoarthritis of left hip 05/16/2016   Vitamin D deficiency 02/05/2016   Degeneration of intervertebral disc of cervical region 02/05/2016   Insomnia 04/23/2015   Major depression in remission ([place]) 04/23/2015   Chronic pain 04/23/2015   Goiter, nontoxic, 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   Hypothyroidism, 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   Uterine leiomyoma 06/16/2007    Past Surgical History:  Procedure Laterality Date   ABDOMINAL HYSTERECTOMY     BILATERAL OOPHORECTOMY     COLONOSCOPY WITH PROPOFOL N/A 06/02/2016    Procedure: COLONOSCOPY WITH PROPOFOL;  Surgeon: Lucilla Lame, MD;  Location: [place];  Service: Endoscopy;  Laterality: N/A;   COLONOSCOPY WITH PROPOFOL N/A 06/21/2021   Procedure: COLONOSCOPY WITH PROPOFOL;  Surgeon: Lucilla Lame, MD;  Location: [place];  Service: Endoscopy;  Laterality: N/A;   dequavian tendonitis Left    KNEE ARTHROSCOPY Bilateral    POLYPECTOMY  06/02/2016   Procedure: POLYPECTOMY;  Surgeon: Lucilla Lame, MD;  Location: [place];  Service: Endoscopy;;   THYROIDECTOMY N/A 06/18/2020   Procedure: THYROIDECTOMY, total;  Surgeon: Fredirick Maudlin, MD;  Location: [facility];  Service: General;  Laterality: N/A;   TONSILLECTOMY     AGE 17 OR 29    Family History  Problem Relation Age of Onset   Diabetes Mother    Heart disease Mother    Emphysema Father    Heart disease Father    COPD Father    Multiple myeloma Brother    Alzheimer's disease Sister    Dementia Sister     Social History   Tobacco Use   Smoking status: Never   Smokeless tobacco: Never  Substance Use Topics   Alcohol use: No    Alcohol/week: 0.0 standard drinks of alcohol     Current Outpatient Medications:    acetaminophen (TYLENOL) 500 MG tablet, Take 500 mg by mouth every 6 (six) hours as needed., Disp: , Rfl:    Artificial Tear Solution (SYSTANE CONTACTS) SOLN, Apply to eye., Disp: , Rfl:    Biotin w/ Vitamins C & E (HAIR SKIN & NAILS GUMMIES PO), Take 2 tablets by mouth every other day., Disp: , Rfl:    Cholecalciferol (VITAMIN D) 50 MCG (2000 UT) CAPS, Take 1 capsule (2,000 Units total) by mouth daily., Disp: 100 capsule, Rfl: 1   Cyanocobalamin (B-12) 500 MCG SUBL, Place 1 tablet under the tongue daily at 12 noon., Disp: 100 tablet, Rfl: 1   diclofenac Sodium (VOLTAREN) 1 % GEL, Apply 4 g topically 4 (four) times daily., Disp: 100 g, Rfl: 1   fluticasone (FLONASE) 50 MCG/ACT nasal spray, Place 2 sprays into both nostrils daily., Disp: 48 g, Rfl: 1    levothyroxine (SYNTHROID) 100 MCG tablet, Take 1 tablet by mouth in the morning. Take 30-60 minutes before breakfast, Disp: , Rfl:    Zoster Vaccine Adjuvanted (SHINGRIX) injection, Inject 0.5 mLs into the muscle once for 1 dose., Disp: 0.5 mL, Rfl: 0   atorvastatin (LIPITOR) 40 MG tablet, Take 1 tablet (40 mg total) by mouth at bedtime., Disp: 90 tablet, Rfl: 1   benzonatate (TESSALON) 200 MG capsule, Take 1 capsule (200 mg total) by mouth at bedtime., Disp: 90 capsule, Rfl: 1   DULoxetine (CYMBALTA) 60 MG capsule, Take 1 capsule (60 mg total) by mouth every morning., Disp: 90 capsule, Rfl: 1   estradiol (  ESTRACE) 0.5 MG tablet, Take 1 tablet (0.5 mg total) by mouth daily., Disp: 90 tablet, Rfl: 1   memantine (NAMENDA) 10 MG tablet, Take 1 tablet by mouth daily., Disp: , Rfl:    traZODone (DESYREL) 50 MG tablet, Take 0.5-1 tablets (25-50 mg total) by mouth at bedtime as needed for sleep., Disp: 90 tablet, Rfl: 1  Allergies  Allergen Reactions   Sulfamethoxazole-Trimethoprim Other (See Comments) and Rash   Citalopram Itching   Omeprazole Palpitations    Also weakness   Tetracycline Other (See Comments)    Other Reaction: URTICARIA   Tetracyclines & Related Itching   Codeine Itching    I personally reviewed active problem list, medication list, allergies, family history, social history, health maintenance with the patient/caregiver today.   ROS  Constitutional: Negative for fever or weight change.  Respiratory: Negative for cough and shortness of breath.   Cardiovascular: Negative for chest pain or palpitations.  Gastrointestinal: Negative for abdominal pain, no bowel changes.  Musculoskeletal: Negative for gait problem or joint swelling.  Skin: Negative for rash.  Neurological: Negative for dizziness or headache.  No other specific complaints in a complete review of systems (except as listed in HPI above).   Objective  Vitals:   06/25/22 1037  BP: 116/70  Pulse: 84  Resp: 16   Temp: 98.2 F (36.8 C)  TempSrc: Oral  SpO2: 92%  Weight: 203 lb 11.2 oz (92.4 kg)  Height: '5\' 6"'  (1.676 m)    Body mass index is 32.88 kg/m.  Physical Exam  Constitutional: Patient appears well-developed and well-nourished. Obese  No distress.  HEENT: head atraumatic, normocephalic, pupils equal and reactive to light, neck supple Cardiovascular: Normal rate, regular rhythm and normal heart sounds.  No murmur heard. No BLE edema. Pulmonary/Chest: Effort normal and breath sounds normal. No respiratory distress. Abdominal: Soft.  There is no tenderness. Psychiatric: Patient has a normal mood and affect. behavior is normal. Judgment and thought content normal.    PHQ2/9:    06/25/2022   10:26 AM 02/19/2022    9:32 AM 12/18/2021   10:31 AM 10/23/2021    9:54 AM 09/11/2021    8:49 AM  Depression screen PHQ 2/9  Decreased Interest 0 '1 2 2 ' 0  Down, Depressed, Hopeless 0 0 1 1 0  PHQ - 2 Score 0 '1 3 3 ' 0  Altered sleeping 0 0 0 1 0  Tired, decreased energy 0 3 0 1 0  Change in appetite 0 0 0 0 0  Feeling bad or failure about yourself  0 0 0 0 0  Trouble concentrating 0 0 1 0 0  Moving slowly or fidgety/restless 0 0 0 0 0  Suicidal thoughts 0 0 0 0 0  PHQ-9 Score 0 '4 4 5 ' 0  Difficult doing work/chores Not difficult at all    Not difficult at all    phq 9 is positive   Fall Risk:    06/25/2022   10:26 AM 02/19/2022    9:32 AM 12/18/2021   10:30 AM 10/23/2021    9:54 AM 09/11/2021    8:49 AM  Fall Risk   Falls in the past year? 0 0 0 0 0  Number falls in past yr: 0 0 0 0 0  Injury with Fall? 0 0 0 0 0  Risk for fall due to : No Fall Risks No Fall Risks No Fall Risks No Fall Risks No Fall Risks  Follow up Falls prevention discussed;Education provided Falls prevention  discussed Falls prevention discussed Falls prevention discussed Falls prevention discussed      Functional Status Survey: Is the patient deaf or have difficulty hearing?: No Does the patient have difficulty  seeing, even when wearing glasses/contacts?: No Does the patient have difficulty concentrating, remembering, or making decisions?: No Does the patient have difficulty walking or climbing stairs?: No Does the patient have difficulty dressing or bathing?: No Does the patient have difficulty doing errands alone such as visiting a doctor's office or shopping?: No    Assessment & Plan  1. Stage 3a chronic kidney disease ([place])  Recheck next visit   2. Moderate episode of recurrent major depressive disorder (HCC)  - DULoxetine (CYMBALTA) 60 MG capsule; Take 1 capsule (60 mg total) by mouth every morning.  Dispense: 90 capsule; Refill: 1  3. Chronic cough  - benzonatate (TESSALON) 200 MG capsule; Take 1 capsule (200 mg total) by mouth at bedtime.  Dispense: 90 capsule; Refill: 1  4. Pure hypercholesterolemia  - atorvastatin (LIPITOR) 40 MG tablet; Take 1 tablet (40 mg total) by mouth at bedtime.  Dispense: 90 tablet; Refill: 1  5. Menopause  - estradiol (ESTRACE) 0.5 MG tablet; Take 1 tablet (0.5 mg total) by mouth daily.  Dispense: 90 tablet; Refill: 1  6. Primary insomnia  - traZODone (DESYREL) 50 MG tablet; Take 0.5-1 tablets (25-50 mg total) by mouth at bedtime as needed for sleep.  Dispense: 90 tablet; Refill: 1  7. Need for shingles vaccine  - Zoster Vaccine Adjuvanted [facility]) injection; Inject 0.5 mLs into the muscle once for 1 dose.  Dispense: 0.5 mL; Refill: 0  8. B12 deficiency  Continue supplementation   9. Hypothyroidism, postablative  Keep follow up with Endo   10. Vitamin D deficiency  Continue supplementation  11. Postmenopausal estrogen deficiency   12. Mild cognitive disorder  Keep follow up with Dr. Manuella Ghazi  13. Primary osteoarthritis of both knees  - Ambulatory referral to Orthopedic Surgery

## 2022-06-25 ENCOUNTER — Ambulatory Visit (INDEPENDENT_AMBULATORY_CARE_PROVIDER_SITE_OTHER): Payer: Medicare Other | Admitting: Family Medicine

## 2022-06-25 ENCOUNTER — Encounter: Payer: Self-pay | Admitting: Family Medicine

## 2022-06-25 VITALS — BP 116/70 | HR 84 | Temp 98.2°F | Resp 16 | Ht 66.0 in | Wt 203.7 lb

## 2022-06-25 DIAGNOSIS — E7800 Pure hypercholesterolemia, unspecified: Secondary | ICD-10-CM | POA: Diagnosis not present

## 2022-06-25 DIAGNOSIS — N1831 Chronic kidney disease, stage 3a: Secondary | ICD-10-CM | POA: Diagnosis not present

## 2022-06-25 DIAGNOSIS — M170 Bilateral primary osteoarthritis of knee: Secondary | ICD-10-CM

## 2022-06-25 DIAGNOSIS — Z23 Encounter for immunization: Secondary | ICD-10-CM

## 2022-06-25 DIAGNOSIS — Z780 Asymptomatic menopausal state: Secondary | ICD-10-CM

## 2022-06-25 DIAGNOSIS — F331 Major depressive disorder, recurrent, moderate: Secondary | ICD-10-CM | POA: Diagnosis not present

## 2022-06-25 DIAGNOSIS — F09 Unspecified mental disorder due to known physiological condition: Secondary | ICD-10-CM

## 2022-06-25 DIAGNOSIS — E890 Postprocedural hypothyroidism: Secondary | ICD-10-CM

## 2022-06-25 DIAGNOSIS — R053 Chronic cough: Secondary | ICD-10-CM

## 2022-06-25 DIAGNOSIS — F5101 Primary insomnia: Secondary | ICD-10-CM

## 2022-06-25 DIAGNOSIS — E559 Vitamin D deficiency, unspecified: Secondary | ICD-10-CM

## 2022-06-25 DIAGNOSIS — E538 Deficiency of other specified B group vitamins: Secondary | ICD-10-CM

## 2022-06-25 MED ORDER — BENZONATATE 200 MG PO CAPS: 200.0000 mg | ORAL_CAPSULE | Freq: Every evening | ORAL | 1 refills | Status: DC

## 2022-06-25 MED ORDER — ESTRADIOL 0.5 MG PO TABS: 0.5000 mg | ORAL_TABLET | Freq: Every day | ORAL | 1 refills | Status: DC

## 2022-06-25 MED ORDER — DULOXETINE HCL 60 MG PO CPEP: 60.0000 mg | ORAL_CAPSULE | ORAL | 1 refills | Status: DC

## 2022-06-25 MED ORDER — TRAZODONE HCL 50 MG PO TABS: 25.0000 mg | ORAL_TABLET | Freq: Every evening | ORAL | 1 refills | Status: DC | PRN

## 2022-06-25 MED ORDER — ATORVASTATIN CALCIUM 40 MG PO TABS: 40.0000 mg | ORAL_TABLET | Freq: Every day | ORAL | 1 refills | Status: DC

## 2022-06-25 MED ORDER — SHINGRIX 50 MCG/0.5ML IM SUSR: 0.5000 mL | Freq: Once | INTRAMUSCULAR | 0 refills | Status: AC

## 2022-06-25 NOTE — Patient Instructions (Addendum)
Call insurance to find out who accepts your insurance  for counseling/therapy   Local clinics includes:  [place]

## 2022-06-30 DIAGNOSIS — M170 Bilateral primary osteoarthritis of knee: Secondary | ICD-10-CM | POA: Diagnosis not present

## 2022-06-30 DIAGNOSIS — M2242 Chondromalacia patellae, left knee: Secondary | ICD-10-CM | POA: Diagnosis not present

## 2022-07-29 DIAGNOSIS — M6281 Muscle weakness (generalized): Secondary | ICD-10-CM | POA: Diagnosis not present

## 2022-07-29 DIAGNOSIS — M25562 Pain in left knee: Secondary | ICD-10-CM | POA: Diagnosis not present

## 2022-08-07 ENCOUNTER — Ambulatory Visit (INDEPENDENT_AMBULATORY_CARE_PROVIDER_SITE_OTHER): Payer: Medicare Other

## 2022-08-07 VITALS — Ht 66.0 in | Wt 203.0 lb

## 2022-08-07 DIAGNOSIS — Z0000 Encounter for general adult medical examination without abnormal findings: Secondary | ICD-10-CM

## 2022-08-07 NOTE — Progress Notes (Signed)
Subjective:  I connected with  Lauren Johnston on 08/07/22 by a audio enabled telemedicine application and verified that I am speaking with the correct person using two identifiers.  Patient Location: Other:  on vacation at the [facility]!  Provider Location: Office/Clinic  I discussed the limitations of evaluation and management by telemedicine. The patient expressed understanding and agreed to proceed.  Lauren Johnston is a 71 y.o. female who presents for Medicare Annual (Subsequent) preventive examination.  Review of Systems    Defer to PCP       Objective:    There were no vitals filed for this visit. There is no height or weight on file to calculate BMI.     08/06/2021   10:54 AM 06/21/2021    7:41 AM 08/02/2020   11:04 AM 06/18/2020    6:32 AM 06/12/2020    3:43 PM 07/26/2019   10:20 AM 07/20/2018   10:05 AM  Advanced Directives  Does Patient Have a Medical Advance Directive? Yes Yes Yes Yes No Yes Yes  Type of Paramedic of [place];Living will [place];Living will [place];Living will Living will;Healthcare Power of [place];Living will [place];Living will  Does patient want to make changes to medical advance directive?  No - Guardian declined  No - Patient declined     Copy of [place] in Chart? No - copy requested No - copy requested No - copy requested No - copy requested  No - copy requested No - copy requested  Would patient like information on creating a medical advance directive?    No - Patient declined       Current Medications (verified) Outpatient Encounter Medications as of 08/07/2022  Medication Sig   acetaminophen (TYLENOL) 500 MG tablet Take 500 mg by mouth every 6 (six) hours as needed.   Artificial Tear Solution (SYSTANE CONTACTS) SOLN Apply to eye.   atorvastatin (LIPITOR) 40 MG tablet Take 1 tablet (40 mg total) by mouth at  bedtime.   benzonatate (TESSALON) 200 MG capsule Take 1 capsule (200 mg total) by mouth at bedtime.   Biotin w/ Vitamins C & E (HAIR SKIN & NAILS GUMMIES PO) Take 2 tablets by mouth every other day.   Cholecalciferol (VITAMIN D) 50 MCG (2000 UT) CAPS Take 1 capsule (2,000 Units total) by mouth daily.   Cyanocobalamin (B-12) 500 MCG SUBL Place 1 tablet under the tongue daily at 12 noon.   diclofenac Sodium (VOLTAREN) 1 % GEL Apply 4 g topically 4 (four) times daily.   DULoxetine (CYMBALTA) 60 MG capsule Take 1 capsule (60 mg total) by mouth every morning.   estradiol (ESTRACE) 0.5 MG tablet Take 1 tablet (0.5 mg total) by mouth daily.   fluticasone (FLONASE) 50 MCG/ACT nasal spray Place 2 sprays into both nostrils daily.   levothyroxine (SYNTHROID) 100 MCG tablet Take 1 tablet by mouth in the morning. Take 30-60 minutes before breakfast   memantine (NAMENDA) 10 MG tablet Take 1 tablet by mouth daily.   traZODone (DESYREL) 50 MG tablet Take 0.5-1 tablets (25-50 mg total) by mouth at bedtime as needed for sleep.   No facility-administered encounter medications on file as of 08/07/2022.    Allergies (verified) Sulfamethoxazole-trimethoprim, Citalopram, Omeprazole, Tetracycline, Tetracyclines & related, and Codeine   History: Past Medical History:  Diagnosis Date   Arthritis    hips   Chronic kidney disease    STAGE  3   Dental crowns present    dental implants - upper   Depression    GERD (gastroesophageal reflux disease)    Hyperlipidemia    Hypothyroidism    Thyroid disease    Past Surgical History:  Procedure Laterality Date   ABDOMINAL HYSTERECTOMY     BILATERAL OOPHORECTOMY     COLONOSCOPY WITH PROPOFOL N/A 06/02/2016   Procedure: COLONOSCOPY WITH PROPOFOL;  Surgeon: Lucilla Lame, MD;  Location: [place];  Service: Endoscopy;  Laterality: N/A;   COLONOSCOPY WITH PROPOFOL N/A 06/21/2021   Procedure: COLONOSCOPY WITH PROPOFOL;  Surgeon: Lucilla Lame, MD;  Location:  [place];  Service: Endoscopy;  Laterality: N/A;   dequavian tendonitis Left    KNEE ARTHROSCOPY Bilateral    POLYPECTOMY  06/02/2016   Procedure: POLYPECTOMY;  Surgeon: Lucilla Lame, MD;  Location: [place];  Service: Endoscopy;;   THYROIDECTOMY N/A 06/18/2020   Procedure: THYROIDECTOMY, total;  Surgeon: Fredirick Maudlin, MD;  Location: [facility];  Service: General;  Laterality: N/A;   TONSILLECTOMY     AGE 74 OR 67   Family History  Problem Relation Age of Onset   Diabetes Mother    Heart disease Mother    Emphysema Father    Heart disease Father    COPD Father    Multiple myeloma Brother    Alzheimer's disease Sister    Dementia Sister    Social History   Socioeconomic History   Marital status: Married    Spouse name: Not on file   Number of children: 3   Years of education: Not on file   Highest education level: Some college, no degree  Occupational History   Occupation: retired  Tobacco Use   Smoking status: Never   Smokeless tobacco: Never  Vaping Use   Vaping Use: Never used  Substance and Sexual Activity   Alcohol use: No    Alcohol/week: 0.0 standard drinks of alcohol   Drug use: No   Sexual activity: Yes    Partners: Male    Birth control/protection: Post-menopausal    Comment: Hysterectomy  Other Topics Concern   Not on file  Social History Narrative   Widow, but remarried Dec 8th 2018   Social Determinants of Health   Financial Resource Strain: Low Risk  (08/06/2021)   Overall Financial Resource Strain (CARDIA)    Difficulty of Paying Living Expenses: Not hard at all  Food Insecurity: No Food Insecurity (08/06/2021)   Hunger Vital Sign    Worried About Running Out of Food in the Last Year: Never true    [place] in the Last Year: Never true  Transportation Needs: No Transportation Needs (08/06/2021)   PRAPARE - Hydrologist (Medical): No    Lack of Transportation (Non-Medical): No  Physical  Activity: Insufficiently Active (08/06/2021)   Exercise Vital Sign    Days of Exercise per Week: 3 days    Minutes of Exercise per Session: 10 min  Stress: No Stress Concern Present (08/06/2021)   [place]    Feeling of Stress : Only a little  Social Connections: Socially Integrated (08/06/2021)   Social Connection and Isolation Panel [NHANES]    Frequency of Communication with Friends and Family: More than three times a week    Frequency of Social Gatherings with Friends and Family: Once a week    Attends Religious Services: More than 4 times per year  Active Member of Clubs or Organizations: Yes    Attends Archivist Meetings: More than 4 times per year    Marital Status: Married    Tobacco Counseling Counseling given: Not Answered   Clinical Intake:                 Diabetic?No         Activities of Daily Living    06/25/2022   10:26 AM 02/19/2022    9:33 AM  In your present state of health, do you have any difficulty performing the following activities:  Hearing? 0 0  Vision? 0 0  Difficulty concentrating or making decisions? 0 1  Walking or climbing stairs? 0 0  Dressing or bathing? 0 0  Doing errands, shopping? 0 0    Patient Care Team: Steele Sizer, MD as PCP - General (Family Medicine) Gabriel Carina, Betsey Holiday, MD as Physician Assistant (Endocrinology) Lovell Sheehan, MD as Consulting Physician (Orthopedic Surgery)  Indicate any recent Medical Services you may have received from other than [facility] providers in the past year (date may be approximate).     Assessment:   This is a routine wellness examination for Lauren Johnston.  Hearing/Vision screen No results found.  Dietary issues and exercise activities discussed:     Goals Addressed   None   Depression Screen    06/25/2022   10:26 AM 02/19/2022    9:32 AM 12/18/2021   10:31 AM 10/23/2021    9:54 AM 09/11/2021    8:49 AM  08/06/2021   10:51 AM 04/18/2021    9:41 AM  PHQ 2/9 Scores  PHQ - 2 Score _0 0 4 6  PHQ- 9 Score _1 0 9 10    Fall Risk    06/25/2022   10:26 AM 02/19/2022    9:32 AM 12/18/2021   10:30 AM 10/23/2021    9:54 AM 09/11/2021    8:49 AM  Fall Risk   Falls in the past year? 0 0 0 0 0  Number falls in past yr: 0 0 0 0 0  Injury with Fall? 0 0 0 0 0  Risk for fall due to : _2   Follow up Falls prevention discussed;Education provided Falls prevention discussed Falls prevention discussed Falls prevention discussed Falls prevention discussed    FALL RISK PREVENTION PERTAINING TO THE HOME:  Any stairs in or around the home? Yes  If so, are there any without handrails? No  Home free of loose throw rugs in walkways, pet beds, electrical cords, etc? Yes  Adequate lighting in your home to reduce risk of falls? Yes   ASSISTIVE DEVICES UTILIZED TO PREVENT FALLS:  Life alert? No  Use of a cane, walker or w/c? No  Grab bars in the bathroom? Yes  Shower chair or bench in shower? Yes  Elevated toilet seat or a handicapped toilet? Yes   TIMED UP AND GO:  Was the test performed? No .  Length of time to ambulate 10 feet: n/a sec.     Cognitive Function:        08/02/2020   11:09 AM 07/20/2018    9:55 AM  6CIT Screen  What Year? 0 points 0 points  What month? 0 points 0 points  What time? 0 points 0 points  Count back from 20 0 points 0 points  Months in reverse 0 points 0 points  Repeat phrase 2 points 2 points  Total Score 2 points 2 points    Immunizations Immunization History  Administered Date(s) Administered   Fluad Quad(high Dose 65+) 05/06/2019, 08/02/2020, 08/06/2021   Influenza Split 06/16/2007, 06/19/2008, 09/07/2008, 07/05/2009, 04/25/2010   Influenza, High Dose Seasonal PF 05/14/2017, 06/02/2018   Influenza, Seasonal, Injecte, Preservative Fre 06/25/2011, 05/26/2012, 05/18/2013    Influenza,inj,Quad PF,6+ Mos 06/07/2014, 04/23/2015, 05/07/2016   Influenza-Unspecified 06/07/2014   Moderna SARS-COV2 Booster Vaccination 05/30/2021, 04/30/2022   Moderna Sars-Covid-2 Vaccination 10/15/2019, 11/12/2019, 10/17/2020   Pneumococcal Conjugate-13 07/17/2017   Pneumococcal Polysaccharide-23 02/05/2016   Tdap 08/01/2010, 04/18/2021   Zoster, Live 04/07/2011    TDAP status: Up to date  Flu Vaccine status: Due, Education has been provided regarding the importance of this vaccine. Advised may receive this vaccine at local pharmacy or Health Dept. Aware to provide a copy of the vaccination record if obtained from local pharmacy or Health Dept. Verbalized acceptance and understanding.  Pneumococcal vaccine status: Up to date  Covid-19 vaccine status: Completed vaccines  Qualifies for Shingles Vaccine? Yes   Zostavax completed No   Shingrix Completed?: No.    Education has been provided regarding the importance of this vaccine. Patient has been advised to call insurance company to determine out of pocket expense if they have not yet received this vaccine. Advised may also receive vaccine at local pharmacy or Health Dept. Verbalized acceptance and understanding.  Screening Tests Health Maintenance  Topic Date Due   Zoster Vaccines- Shingrix (1 of 2) Never done   COVID-19 Vaccine (4 - 2023-24 season) 06/25/2022   INFLUENZA VACCINE  11/30/2022 (Originally 04/01/2022)   Medicare Annual Wellness (AWV)  08/08/2023   MAMMOGRAM  06/09/2024   COLONOSCOPY (Pts 45-43yr Insurance coverage will need to be confirmed)  06/21/2026   DTaP/Tdap/Td (3 - Td or Tdap) 04/19/2031   Pneumonia Vaccine 72 Years old  Completed   DEXA SCAN  Completed   Hepatitis C Screening  Completed   HPV VACCINES  Aged Out    Health Maintenance  Health Maintenance Due  Topic Date Due   Zoster Vaccines- Shingrix (1 of 2) Never done   COVID-19 Vaccine (4 - 2023-24 season) 06/25/2022    Colorectal cancer  screening: Type of screening: Colonoscopy. Completed 06/21/2021. Repeat every 5 years  Mammogram status: Completed 06/09/2022. Repeat every year  Bone Density status: Completed 06/09/2022. Results reflect: Bone density results: NORMAL. Repeat every 2 years.  Lung Cancer Screening: (Low Dose CT Chest recommended if Age 72-80years, 30 pack-year currently smoking OR have quit w/in 15years.) does not qualify.   Lung Cancer Screening Referral: n/a  Additional Screening:  Hepatitis C Screening: does not qualify; Completed 07/17/2017  Vision Screening: Recommended annual ophthalmology exams for early detection of glaucoma and other disorders of the eye. Is the patient up to date with their annual eye exam?  Yes  Who is the provider or what is the name of the [facility] which the patient [facility]ends [facility] exams[facility] not [facility] with a provider, would they like to be referred to a provider to establish care? No .   Dental Screening: Recommended annual dental exams for proper oral hygiene  Community Resource Referral / Chronic Care Management: CRR required this visit?  No   CCM required this visit?  No      Plan:     I have personally reviewed and noted the following in the patient's chart:   Medical and social history  Use of alcohol, tobacco or illicit drugs  Current medications and supplements including opioid prescriptions. Patient is not currently taking opioid prescriptions. Functional ability and status Nutritional status Physical activity Advanced directives List of other physicians Hospitalizations, surgeries, and ER visits in previous 12 months Vitals Screenings to include cognitive, depression, and falls Referrals and appointments  In addition, I have reviewed and discussed with patient certain preventive protocols, quality metrics, and best practice recommendations. A written personalized care plan for preventive services as well as  general preventive health recommendations were provided to patient.     Royal Hawthorn, [place]   08/07/2022   Nurse Notes:  Lauren Johnston , Thank you for taking time to come for your Medicare Wellness Visit. I appreciate your ongoing commitment to your health goals. Please review the following plan we discussed and let me know if I can assist you in the future.   These are the goals we discussed:  Goals      Increase physical activity     Increase physical activity to 150 minutes per week        This is a list of the screening recommended for you and due dates:  Health Maintenance  Topic Date Due   Zoster (Shingles) Vaccine (1 of 2) Never done   COVID-19 Vaccine (4 - 2023-24 season) 06/25/2022   Flu Shot  11/30/2022*   Medicare Annual Wellness Visit  08/08/2023   Mammogram  06/09/2024   Colon Cancer Screening  06/21/2026   DTaP/Tdap/Td vaccine (3 - Td or Tdap) 04/19/2031   Pneumonia Vaccine  Completed   DEXA scan (bone density measurement)  Completed   Hepatitis C Screening: USPSTF Recommendation to screen - Ages 38-79 yo.  Completed   HPV Vaccine  Aged Out  *Topic was postponed. The date shown is not the original due date.

## 2022-08-18 DIAGNOSIS — Z9842 Cataract extraction status, left eye: Secondary | ICD-10-CM | POA: Diagnosis not present

## 2022-08-18 DIAGNOSIS — Z9841 Cataract extraction status, right eye: Secondary | ICD-10-CM | POA: Diagnosis not present

## 2022-08-18 DIAGNOSIS — H5213 Myopia, bilateral: Secondary | ICD-10-CM | POA: Diagnosis not present

## 2022-08-19 NOTE — Progress Notes (Unsigned)
Name: SHAWNELL Johnston   MRN: [id number]    DOB: 11-May-1951   Date:08/20/2022       Progress Note  Subjective  Chief Complaint  Annual Exam  HPI  Patient presents for annual CPE.  Diet: balanced  Exercise: discussed 150 minutes   Last Eye Exam: up to date  Last Dental Exam: up to date   [employer] Visit from 08/20/2022 in [facility]  AUDIT-C Score 0      Depression: Phq 9 is  positive    08/20/2022    9:56 AM 08/07/2022   10:54 AM 06/25/2022   10:26 AM 02/19/2022    9:32 AM 12/18/2021   10:31 AM  Depression screen PHQ 2/9  Decreased Interest 2 0 _0 Down, Depressed, Hopeless 2 0 3 0 1  PHQ - 2 Score 4 0 _1 Altered sleeping 0 0 0 0 0  Tired, decreased energy 0 0 1 3 0  Change in appetite 0 0 0 0 0  Feeling bad or failure about yourself  0 0 1 0 0  Trouble concentrating 3 0 0 0 1  Moving slowly or fidgety/restless 0 0 0 0 0  Suicidal thoughts 0 0 0 0 0  PHQ-9 Score 7 0 _2 Difficult doing work/chores   Somewhat difficult     Hypertension: BP Readings from Last 3 Encounters:  08/20/22 114/70  06/25/22 116/70  02/19/22 128/74   Obesity: Wt Readings from Last 3 Encounters:  08/20/22 205 lb (93 kg)  08/07/22 203 lb (92.1 kg)  06/25/22 203 lb 11.2 oz (92.4 kg)   BMI Readings from Last 3 Encounters:  08/20/22 33.09 kg/m  08/07/22 32.77 kg/m  06/25/22 32.88 kg/m     Vaccines:   RSV: discussed it with patient  Tdap: up to date Shingrix: she will get it at the local pharmacy  Pneumonia: up to date Flu: 2022,she will get it today  COVID-19: discussed booster    Hep C Screening: 07/17/17 STD testing and prevention (HIV/chl/gon/syphilis): N/A Intimate partner violence: negative screen  Sexual History : not frequently but with her husband  Menstrual History/LMP/Abnormal Bleeding: post-menopausal  Discussed importance of follow up if any post-menopausal bleeding: yes  Incontinence Symptoms: negative for symptoms    Breast cancer:  - Last Mammogram: 06/09/22 - BRCA gene screening: N/A  Osteoporosis Prevention : Discussed high calcium and vitamin D supplementation, weight bearing exercises Bone density: 06/09/22  FRAX* RESULTS:  (version: 3.5) 10-year Probability of Fracture1 Major Osteoporotic Fracture2 Hip Fracture 4.4% 0.7%  Cervical cancer screening: N/A  Skin cancer: Discussed monitoring for atypical lesions  Colorectal cancer: 06/21/21   Lung cancer:  Low Dose CT Chest recommended if Age 76-80 years, 20 pack-year currently smoking OR have quit w/in 15years. Patient does not qualify for screen   ECG: 06/14/20  Advanced Care Planning: A voluntary discussion about advance care planning including the explanation and discussion of advance directives.  Discussed health care proxy and Living will, and the patient was able to identify a health care proxy as husband.  Patient has a living will and power of attorney of health care   Lipids: Lab Results  Component Value Date   CHOL 133 10/23/2021   CHOL 130 10/15/2020   CHOL 158 10/24/2019   Lab Results  Component Value Date   HDL 46 (L) 10/23/2021   HDL 42 (L) 10/15/2020   HDL 45 (L) 10/24/2019   Lab Results  Component Value Date   LDLCALC 74 10/23/2021   LDLCALC 72 10/15/2020   LDLCALC 94 10/24/2019   Lab Results  Component Value Date   TRIG 58 10/23/2021   TRIG 84 10/15/2020   TRIG 95 10/24/2019   Lab Results  Component Value Date   CHOLHDL 2.9 10/23/2021   CHOLHDL 3.1 10/15/2020   CHOLHDL 3.5 10/24/2019   No results found for: "LDLDIRECT"  Glucose: Glucose  Date Value Ref Range Status  08/14/2012 111 (H) 65 - 99 mg/dL Final   Glucose, Bld  Date Value Ref Range Status  10/23/2021 94 65 - 99 mg/dL Final    Comment:    .            Fasting reference interval .   10/15/2020 96 65 - 99 mg/dL Final    Comment:    .            Fasting reference interval .   06/14/2020 95 70 - 99 mg/dL Final    Comment:     Glucose reference range applies only to samples taken after fasting for at least 8 hours.    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 10/15/2020   Pure hypercholesterolemia 10/15/2020   Stage 3a chronic kidney disease ([place]) 10/15/2020   S/P total thyroidectomy 06/18/2020   Class 1 obesity in adult 11/11/2016   Osteopenia 10/09/2016   Rectal polyp    Right wrist tendonitis 05/16/2016   Osteoarthritis of left hip 05/16/2016   Vitamin D deficiency 02/05/2016   Degeneration of intervertebral disc of cervical region 02/05/2016   Insomnia 04/23/2015   Major depression in remission ([place]) 04/23/2015   Chronic pain 04/23/2015   Goiter, nontoxic, 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   Hypothyroidism, 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   Uterine leiomyoma 06/16/2007    Past Surgical History:  Procedure Laterality Date   ABDOMINAL HYSTERECTOMY     BILATERAL OOPHORECTOMY     COLONOSCOPY WITH PROPOFOL N/A 06/02/2016   Procedure: COLONOSCOPY WITH PROPOFOL;  Surgeon: Lucilla Lame, MD;  Location: [facility];  Service: Endoscopy;  Laterality: N/A;   COLONOSCOPY WITH PROPOFOL N/A 06/21/2021   Procedure: COLONOSCOPY WITH PROPOFOL;  Surgeon: Lucilla Lame, MD;  Location: [facility];  Service: Endoscopy;  Laterality: N/A;   dequavian tendonitis Left    KNEE ARTHROSCOPY Bilateral    POLYPECTOMY  06/02/2016   Procedure: POLYPECTOMY;  Surgeon: Lucilla Lame, MD;  Location: [facility];  Service: Endoscopy;;   THYROIDECTOMY N/A 06/18/2020   Procedure: THYROIDECTOMY, total;  Surgeon: Fredirick Maudlin, MD;  Location: [facility];  Service: General;  Laterality: N/A;   TONSILLECTOMY     AGE 6 OR 49    Family History  Problem Relation Age of Onset   Diabetes Mother    Heart disease Mother    Emphysema Father    Heart disease Father     COPD Father    Multiple myeloma Brother    Alzheimer's disease Sister    Dementia Sister     Social History   Socioeconomic History   Marital status: Married    Spouse name: Not on file   Number of children: 3   Years of education: Not on file   Highest education level: Some college, no degree  Occupational History   Occupation: retired  Tobacco Use   Smoking status:  Never   Smokeless tobacco: Never  Vaping Use   Vaping Use: Never used  Substance and Sexual Activity   Alcohol use: No    Alcohol/week: 0.0 standard drinks of alcohol   Drug use: No   Sexual activity: Yes    Partners: Male    Birth control/protection: Post-menopausal    Comment: Hysterectomy  Other Topics Concern   Not on file  Social History Narrative   Widow, but remarried Dec 8th 2018   Social Determinants of Health   Financial Resource Strain: Low Risk  (08/20/2022)   Overall Financial Resource Strain (CARDIA)    Difficulty of Paying Living Expenses: Not hard at all  Food Insecurity: No Food Insecurity (08/20/2022)   Hunger Vital Sign    Worried About Running Out of Food in the Last Year: Never true    [place] in the Last Year: Never true  Transportation Needs: No Transportation Needs (08/20/2022)   PRAPARE - Hydrologist (Medical): No    Lack of Transportation (Non-Medical): No  Physical Activity: Insufficiently Active (08/20/2022)   Exercise Vital Sign    Days of Exercise per Week: 2 days    Minutes of Exercise per Session: 20 min  Stress: Stress Concern Present (08/20/2022)   [place]    Feeling of Stress : Rather much  Social Connections: Socially Integrated (08/20/2022)   Social Connection and Isolation Panel [NHANES]    Frequency of Communication with Friends and Family: More than three times a week    Frequency of Social Gatherings with Friends and Family: Once a week    Attends  Religious Services: More than 4 times per year    Active Member of [employer] or Organizations: Yes    Attends Music therapist: More than 4 times per year    Marital Status: Married  Human resources officer Violence: Not At Risk (08/20/2022)   Humiliation, Afraid, Rape, and Kick questionnaire    Fear of Current or Ex-Partner: No    Emotionally Abused: No    Physically Abused: No    Sexually Abused: No     Current Outpatient Medications:    acetaminophen (TYLENOL) 500 MG tablet, Take 500 mg by mouth every 6 (six) hours as needed., Disp: , Rfl:    Artificial Tear Solution (SYSTANE CONTACTS) SOLN, Apply to eye., Disp: , Rfl:    atorvastatin (LIPITOR) 40 MG tablet, Take 1 tablet (40 mg total) by mouth at bedtime., Disp: 90 tablet, Rfl: 1   benzonatate (TESSALON) 200 MG capsule, Take 1 capsule (200 mg total) by mouth at bedtime., Disp: 90 capsule, Rfl: 1   Biotin w/ Vitamins C & E (HAIR SKIN & NAILS GUMMIES PO), Take 2 tablets by mouth every other day., Disp: , Rfl:    Cholecalciferol (VITAMIN D) 50 MCG (2000 UT) CAPS, Take 1 capsule (2,000 Units total) by mouth daily., Disp: 100 capsule, Rfl: 1   Cyanocobalamin (B-12) 500 MCG SUBL, Place 1 tablet under the tongue daily at 12 noon., Disp: 100 tablet, Rfl: 1   diclofenac Sodium (VOLTAREN) 1 % GEL, Apply 4 g topically 4 (four) times daily., Disp: 100 g, Rfl: 1   DULoxetine (CYMBALTA) 60 MG capsule, Take 1 capsule (60 mg total) by mouth every morning., Disp: 90 capsule, Rfl: 1   estradiol (ESTRACE) 0.5 MG tablet, Take 1 tablet (0.5 mg total) by mouth daily., Disp: 90 tablet, Rfl: 1   fluticasone (FLONASE) 50  MCG/ACT nasal spray, Place 2 sprays into both nostrils daily., Disp: 48 g, Rfl: 1   traZODone (DESYREL) 50 MG tablet, Take 0.5-1 tablets (25-50 mg total) by mouth at bedtime as needed for sleep., Disp: 90 tablet, Rfl: 1   levothyroxine (SYNTHROID) 100 MCG tablet, Take 1 tablet by mouth in the morning. Take 30-60 minutes before breakfast,  Disp: , Rfl:    memantine (NAMENDA) 10 MG tablet, Take 1 tablet by mouth daily., Disp: , Rfl:   Allergies  Allergen Reactions   Sulfamethoxazole-Trimethoprim Other (See Comments) and Rash   Citalopram Itching   Omeprazole Palpitations    Also weakness   Tetracycline Other (See Comments)    Other Reaction: URTICARIA   Tetracyclines & Related Itching   Codeine Itching     ROS  Constitutional: Negative for fever or weight change.  Respiratory: Negative for cough and shortness of breath.   Cardiovascular: Negative for chest pain or palpitations.  Gastrointestinal: Negative for abdominal pain, no bowel changes.  Musculoskeletal: Negative for gait problem or joint swelling.  Skin: Negative for rash.  Neurological: Negative for dizziness or headache.  No other specific complaints in a complete review of systems (except as listed in HPI above).   Objective  Vitals:   08/20/22 0956  BP: 114/70  Pulse: 82  Resp: 16  SpO2: 97%  Weight: 205 lb (93 kg)  Height: _0  (1.676 m)    Body mass index is 33.09 kg/m.  Physical Exam  Constitutional: Patient appears well-developed and well-nourished.Obese  No distress.  HENT: Head: Normocephalic and atraumatic. Ears: B TMs ok, no erythema or effusion; Nose: Nose normal. Mouth/Throat: Oropharynx is clear and moist. No oropharyngeal exudate.  Eyes: Conjunctivae and EOM are normal. Pupils are equal, round, and reactive to light. No scleral icterus.  Neck: Normal range of motion. Neck supple. No JVD present. No thyromegaly present.  Cardiovascular: Normal rate, regular rhythm and normal heart sounds.  No murmur heard. No BLE edema. Pulmonary/Chest: Effort normal and breath sounds normal. No respiratory distress. Abdominal: Soft. Bowel sounds are normal, no distension. There is no tenderness. no masses Breast: no lumps or masses, no nipple discharge or rashes FEMALE GENITALIA:  Not done  RECTAL: not done  Musculoskeletal: Normal range of  motion, no joint effusions. No gross deformities Neurological: he is alert and oriented to person, place, and time. No cranial nerve deficit. Coordination, balance, strength, speech and gait are normal.  Skin: Skin is warm and dry. No rash noted. No erythema.  Psychiatric: Patient has a normal mood and affect. behavior is normal. Judgment and thought content normal.   Fall Risk:    08/20/2022    9:56 AM 06/25/2022   10:26 AM 02/19/2022    9:32 AM 12/18/2021   10:30 AM 10/23/2021    9:54 AM  Fall Risk   Falls in the past year? 0 0 0 0 0  Number falls in past yr: 0 0 0 0 0  Injury with Fall? 0 0 0 0 0  Risk for fall due to : _1   Follow up Falls prevention discussed Falls prevention discussed;Education provided Falls prevention discussed Falls prevention discussed Falls prevention discussed     Functional Status Survey: Is the patient deaf or have difficulty hearing?: No Does the patient have difficulty seeing, even when wearing glasses/contacts?: No Does the patient have difficulty concentrating, remembering, or making decisions?: Yes Does the patient have  difficulty walking or climbing stairs?: No Does the patient have difficulty dressing or bathing?: No Does the patient have difficulty doing errands alone such as visiting a doctor's office or shopping?: No   Assessment & Plan  1. Well adult exam   2. Needs flu shot  - Flu vaccine HIGH DOSE PF (Fluzone High dose)   -USPSTF grade A and B recommendations reviewed with patient; age-appropriate recommendations, preventive care, screening tests, etc discussed and encouraged; healthy living encouraged; see AVS for patient education given to patient -Discussed importance of 150 minutes of physical activity weekly, eat two servings of fish weekly, eat one serving of tree nuts ( cashews, pistachios, pecans, almonds.Marland Kitchen) every other day, eat 6 servings of fruit/vegetables  daily and drink plenty of water and avoid sweet beverages.   -Reviewed Health Maintenance: Yes.

## 2022-08-19 NOTE — Patient Instructions (Incomplete)
You need RSV and Shingrix vaccines at local pharmacy   Preventive Care 65 Years and Older, Female Preventive care refers to lifestyle choices and visits with your health care provider that can promote health and wellness. Preventive care visits are also called wellness exams. What can I expect for my preventive care visit? Counseling Your health care provider may ask you questions about your: Medical history, including: Past medical problems. Family medical history. Pregnancy and menstrual history. History of falls. Current health, including: Memory and ability to understand (cognition). Emotional well-being. Home life and relationship well-being. Sexual activity and sexual health. Lifestyle, including: Alcohol, nicotine or tobacco, and drug use. Access to firearms. Diet, exercise, and sleep habits. Work and work Astronomer. Sunscreen use. Safety issues such as seatbelt and bike helmet use. Physical exam Your health care provider will check your: Height and weight. These may be used to calculate your BMI (body mass index). BMI is a measurement that tells if you are at a healthy weight. Waist circumference. This measures the distance around your waistline. This measurement also tells if you are at a healthy weight and may help predict your risk of certain diseases, such as type 2 diabetes and high blood pressure. Heart rate and blood pressure. Body temperature. Skin for abnormal spots. What immunizations do I need?  Vaccines are usually given at various ages, according to a schedule. Your health care provider will recommend vaccines for you based on your age, medical history, and lifestyle or other factors, such as travel or where you work. What tests do I need? Screening Your health care provider may recommend screening tests for certain conditions. This may include: Lipid and cholesterol levels. Hepatitis C test. Hepatitis B test. HIV (human immunodeficiency virus)  test. STI (sexually transmitted infection) testing, if you are at risk. Lung cancer screening. Colorectal cancer screening. Diabetes screening. This is done by checking your blood sugar (glucose) after you have not eaten for a while (fasting). Mammogram. Talk with your health care provider about how often you should have regular mammograms. BRCA-related cancer screening. This may be done if you have a family history of breast, ovarian, tubal, or peritoneal cancers. Bone density scan. This is done to screen for osteoporosis. Talk with your health care provider about your test results, treatment options, and if necessary, the need for more tests. Follow these instructions at home: Eating and drinking  Eat a diet that includes fresh fruits and vegetables, whole grains, lean protein, and low-fat dairy products. Limit your intake of foods with high amounts of sugar, saturated fats, and salt. Take vitamin and mineral supplements as recommended by your health care provider. Do not drink alcohol if your health care provider tells you not to drink. If you drink alcohol: Limit how much you have to 0-1 drink a day. Know how much alcohol is in your drink. In the [place] one drink equals one 12 oz bottle of beer (355 mL), one 5 oz glass of wine (148 mL), or one 1 oz glass of hard liquor (44 mL). Lifestyle Brush your teeth every morning and night with fluoride toothpaste. Floss one time each day. Exercise for at least 30 minutes 5 or more days each week. Do not use any products that contain nicotine or tobacco. These products include cigarettes, chewing tobacco, and vaping devices, such as e-cigarettes. If you need help quitting, ask your health care provider. Do not use drugs. If you are sexually active, practice safe sex. Use a condom or other form of protection  in order to prevent STIs. Take aspirin only as told by your health care provider. Make sure that you understand how much to take and what form to  take. Work with your health care provider to find out whether it is safe and beneficial for you to take aspirin daily. Ask your health care provider if you need to take a cholesterol-lowering medicine (statin). Find healthy ways to manage stress, such as: Meditation, yoga, or listening to music. Journaling. Talking to a trusted person. Spending time with friends and family. Minimize exposure to UV radiation to reduce your risk of skin cancer. Safety Always wear your seat belt while driving or riding in a vehicle. Do not drive: If you have been drinking alcohol. Do not ride with someone who has been drinking. When you are tired or distracted. While texting. If you have been using any mind-altering substances or drugs. Wear a helmet and other protective equipment during sports activities. If you have firearms in your house, make sure you follow all gun safety procedures. What's next? Visit your health care provider once a year for an annual wellness visit. Ask your health care provider how often you should have your eyes and teeth checked. Stay up to date on all vaccines. This information is not intended to replace advice given to you by your health care provider. Make sure you discuss any questions you have with your health care provider. Document Revised: 02/13/2021 Document Reviewed: 02/13/2021 Elsevier Patient Education  2023 [employer].

## 2022-08-20 ENCOUNTER — Encounter: Payer: Self-pay | Admitting: Family Medicine

## 2022-08-20 ENCOUNTER — Ambulatory Visit (INDEPENDENT_AMBULATORY_CARE_PROVIDER_SITE_OTHER): Payer: Medicare Other | Admitting: Family Medicine

## 2022-08-20 VITALS — BP 114/70 | HR 82 | Resp 16 | Ht 66.0 in | Wt 205.0 lb

## 2022-08-20 DIAGNOSIS — Z0000 Encounter for general adult medical examination without abnormal findings: Secondary | ICD-10-CM

## 2022-08-20 DIAGNOSIS — Z23 Encounter for immunization: Secondary | ICD-10-CM | POA: Diagnosis not present

## 2022-08-21 ENCOUNTER — Ambulatory Visit (INDEPENDENT_AMBULATORY_CARE_PROVIDER_SITE_OTHER): Payer: Medicare Other | Admitting: Dermatology

## 2022-08-21 DIAGNOSIS — L821 Other seborrheic keratosis: Secondary | ICD-10-CM | POA: Diagnosis not present

## 2022-08-21 DIAGNOSIS — L299 Pruritus, unspecified: Secondary | ICD-10-CM | POA: Diagnosis not present

## 2022-08-21 DIAGNOSIS — Z7189 Other specified counseling: Secondary | ICD-10-CM

## 2022-08-21 NOTE — Patient Instructions (Addendum)
Seborrheic Keratosis  What causes seborrheic keratoses? Seborrheic keratoses are harmless, common skin growths that first appear during adult life.  As time goes by, more growths appear.  Some people may develop a large number of them.  Seborrheic keratoses appear on both covered and uncovered body parts.  They are not caused by sunlight.  The tendency to develop seborrheic keratoses can be inherited.  They vary in color from skin-colored to gray, brown, or even black.  They can be either smooth or have a rough, warty surface.   Seborrheic keratoses are superficial and look as if they were stuck on the skin.  Under the microscope this type of keratosis looks like layers upon layers of skin.  That is why at times the top layer may seem to fall off, but the rest of the growth remains and re-grows.    Treatment Seborrheic keratoses do not need to be treated, but can easily be removed in the office.  Seborrheic keratoses often cause symptoms when they rub on clothing or jewelry.  Lesions can be in the way of shaving.  If they become inflamed, they can cause itching, soreness, or burning.  Removal of a seborrheic keratosis can be accomplished by freezing, burning, or surgery. If any spot bleeds, scabs, or grows rapidly, please return to have it checked, as these can be an indication of a skin cancer.  Recommend OTC Gold Bond Rapid Relief Anti-Itch cream (pramoxine + menthol), CeraVe Anti-itch cream or lotion (pramoxine), Sarna lotion (Original- menthol + camphor or Sensitive- pramoxine) or Eucerin 12 hour Itch Relief lotion (menthol) up to 3 times per day to areas on body that are itchy.  Due to recent changes in healthcare laws, you may see results of your pathology and/or laboratory studies on MyChart before the doctors have had a chance to review them. We understand that in some cases there may be results that are confusing or concerning to you. Please understand that not all results are received at the  same time and often the doctors may need to interpret multiple results in order to provide you with the best plan of care or course of treatment. Therefore, we ask that you please give [place] 2 business days to thoroughly review all your results before contacting the office for clarification. Should we see a critical lab result, you will be contacted sooner.   If You Need Anything After Your Visit  If you have any questions or concerns for your doctor, please call our main line at [phone] and press option 4 to reach your doctor's medical assistant. If no one answers, please leave a voicemail as directed and we will return your call as soon as possible. Messages left after 4 pm will be answered the following business day.   You may also send [place] a message via [place]. We typically respond to MyChart messages within 1-2 business days.  For prescription refills, please ask your pharmacy to contact our office. Our fax number is [phone].  If you have an urgent issue when the clinic is closed that cannot wait until the next business day, you can page your doctor at the number below.    Please note that while we do our best to be available for urgent issues outside of office hours, we are not available 24/7.   If you have an urgent issue and are unable to reach [place], you may choose to seek medical care at your doctor's office, retail clinic, urgent care center, or emergency room.  If you have a medical emergency, please immediately call 911 or go to the emergency department.  Pager Numbers  - Dr. Nehemiah Massed: [phone]  - Dr. Laurence Ferrari: [phone]  - Dr. Nicole Kindred: [phone]  In the event of inclement weather, please call our main line at [phone] for an update on the status of any delays or closures.  Dermatology Medication Tips: Please keep the boxes that topical medications come in in order to help keep track of the instructions about where and how to use these. Pharmacies typically  print the medication instructions only on the boxes and not directly on the medication tubes.   If your medication is too expensive, please contact our office at [phone] option 4 or send [place] a message through [place].   We are unable to tell what your co-pay for medications will be in advance as this is different depending on your insurance coverage. However, we may be able to find a substitute medication at lower cost or fill out paperwork to get insurance to cover a needed medication.   If a prior authorization is required to get your medication covered by your insurance company, please allow [place] 1-2 business days to complete this process.  Drug prices often vary depending on where the prescription is filled and some pharmacies may offer cheaper prices.  The website www.goodrx.com contains coupons for medications through different pharmacies. The prices here do not account for what the cost may be with help from insurance (it may be cheaper with your insurance), but the website can give you the price if you did not use any insurance.  - You can print the associated coupon and take it with your prescription to the pharmacy.  - You may also stop by our office during regular business hours and pick up a GoodRx coupon card.  - If you need your prescription sent electronically to a different pharmacy, notify our office through [facility] or by phone at [phone] option 4.     Si Usted Necesita Algo Despus de Su Visita  Tambin puede enviarnos un mensaje a travs de Pharmacist, community. Por lo general respondemos a los mensajes de MyChart en el transcurso de 1 a 2 das hbiles.  Para renovar recetas, por favor pida a su farmacia que se ponga en contacto con nuestra oficina. Harland Dingwall de fax es [place] [phone].  Si tiene un asunto urgente cuando la clnica est cerrada y que no puede esperar hasta el siguiente da hbil, puede llamar/localizar a su doctor(a) al nmero que aparece a  continuacin.   Por favor, tenga en cuenta que aunque hacemos todo lo posible para estar disponibles para asuntos urgentes fuera del horario de [place], no estamos disponibles las 24 horas del da, los 7 das de la [place].   Si tiene un problema urgente y no puede comunicarse con nosotros, puede optar por buscar atencin mdica  en el consultorio de su doctor(a), en una clnica privada, en un centro de atencin urgente o en una sala de emergencias.  Si tiene Engineering geologist, por favor llame inmediatamente al 911 o vaya a la sala de emergencias.  Nmeros de bper  - Dr. Nehemiah Massed: [phone]  - Dra. Moye: [phone]  - Dra. Nicole Kindred: [phone]  En caso de inclemencias del [place], por favor llame a Johnsie Kindred principal al [phone] para una actualizacin sobre el [place] de cualquier retraso o cierre.  Consejos para la medicacin en dermatologa: Por favor, guarde las cajas en las que vienen los medicamentos de  uso tpico para ayudarle a seguir las [employer] dnde y cmo usarlos. Las farmacias generalmente imprimen las instrucciones del medicamento slo en las cajas y no directamente en los tubos del [place].   Si su medicamento es muy caro, por favor, pngase en contacto con Zigmund Daniel llamando al [phone] y presione la opcin 4 o envenos un mensaje a travs de Pharmacist, community.   No podemos decirle cul ser su copago por los medicamentos por adelantado ya que esto es diferente dependiendo de la cobertura de su seguro. Sin embargo, es posible que podamos encontrar un medicamento sustituto a Electrical engineer un formulario para que el seguro cubra el medicamento que se considera necesario.   Si se requiere una autorizacin previa para que su compaa de seguros [place] su medicamento, por favor permtanos de 1 a 2 das hbiles para completar este proceso.  Los precios de los medicamentos varan con frecuencia dependiendo del Environmental consultant de dnde se surte la receta  y alguna farmacias pueden ofrecer precios ms baratos.  El sitio web www.goodrx.com tiene cupones para medicamentos de Airline pilot. Los precios aqu no tienen en cuenta lo que podra costar con la ayuda del seguro (puede ser ms barato con su seguro), pero el sitio web puede darle el precio si no utiliz Research scientist (physical sciences).  - Puede imprimir el cupn correspondiente y llevarlo con su receta a la farmacia.  - Tambin puede pasar por nuestra oficina durante el horario de atencin regular y Charity fundraiser una tarjeta de cupones de GoodRx.  - Si necesita que su receta se enve electrnicamente a una farmacia diferente, informe a nuestra oficina a travs de MyChart de [place] o por telfono llamando al [phone] y presione la opcin 4.

## 2022-08-21 NOTE — Progress Notes (Signed)
   New Patient Visit  Subjective  Lauren Johnston is a 71 y.o. female who presents for the following: Skin Problem (Patient here today for a spot at right forehead that itches. Present for many years.).  Patient accompanied by husband.   The following portions of the chart were reviewed this encounter and updated as appropriate:   Tobacco  Allergies  Meds  Problems  Med Hx  Surg Hx  Fam Hx     Review of Systems:  No other skin or systemic complaints except as noted in HPI or Assessment and Plan.  Objective  Well appearing patient in no apparent distress; mood and affect are within normal limits.  A focused examination was performed including face. Relevant physical exam findings are noted in the Assessment and Plan.  right forehead Stuck-on, waxy brown plaque 1.5 cm -- Discussed benign etiology and prognosis.    Assessment & Plan  Seborrheic keratosis right forehead With pruritus and growth See photo in media Seborrheic Keratosis - Stuck-on, waxy, tan-brown papule - Benign-appearing - Discussed benign etiology and prognosis. - Observe - Call for any changes Patient Mould over whether to treat or not.  She decided not to treat since it is not bothering her. We discussed several options for treatment.  She will return if she decides she wants to pursue this.  Return if symptoms worsen or fail to improve.  Lauren Johnston, RMA, am acting as scribe for Sarina Ser, MD . Documentation: I have reviewed the above documentation for accuracy and completeness, and I agree with the above.  Sarina Ser, MD

## 2022-08-30 ENCOUNTER — Encounter: Payer: Self-pay | Admitting: Dermatology

## 2022-10-14 ENCOUNTER — Ambulatory Visit: Payer: Self-pay | Admitting: *Deleted

## 2022-10-14 ENCOUNTER — Ambulatory Visit (INDEPENDENT_AMBULATORY_CARE_PROVIDER_SITE_OTHER): Payer: Medicare Other | Admitting: Physician Assistant

## 2022-10-14 ENCOUNTER — Encounter: Payer: Self-pay | Admitting: Physician Assistant

## 2022-10-14 VITALS — BP 120/78 | HR 85 | Temp 97.9°F | Resp 16 | Ht 66.0 in | Wt 199.9 lb

## 2022-10-14 DIAGNOSIS — R0789 Other chest pain: Secondary | ICD-10-CM | POA: Diagnosis not present

## 2022-10-14 DIAGNOSIS — R059 Cough, unspecified: Secondary | ICD-10-CM | POA: Diagnosis not present

## 2022-10-14 DIAGNOSIS — J069 Acute upper respiratory infection, unspecified: Secondary | ICD-10-CM

## 2022-10-14 LAB — POCT INFLUENZA A/B
Influenza A, POC: NEGATIVE
Influenza B, POC: NEGATIVE

## 2022-10-14 MED ORDER — METHYLPREDNISOLONE 4 MG PO TBPK: ORAL_TABLET | Freq: Every day | ORAL | 0 refills | Status: DC

## 2022-10-14 MED ORDER — BENZONATATE 200 MG PO CAPS: 200.0000 mg | ORAL_CAPSULE | Freq: Two times a day (BID) | ORAL | 0 refills | Status: DC | PRN

## 2022-10-14 NOTE — Telephone Encounter (Signed)
Message from [place] Callas sent at 10/14/2022 10:20 AM EST  Summary: cough and congestion   Pt called with cough, congestion, no fever, she ask if something could be sent to the pharmacy for cough  [facility]  CB#  [phone]         Chief Complaint: cough with phlegm and chest tightness and soreness Symptoms: white phlegm, hoarse,  Frequency: last Friday  Pertinent Negatives: Patient denies fever, SOB Disposition: []$ ED /[]$ Urgent Care (no appt availability in office) / [x]$ Appointment(In office/virtual)/ []$  [facility]/ []$ Home Care/ []$ Refused Recommended Disposition /[]$ [facility]/ []$  Follow-up with PCP Additional Notes: pt req. Abx and something to help with tightness  Reason for Disposition  Cough  Answer Assessment - Initial Assessment Questions 1. ONSET: "When did the cough begin?"      Last Friday  2. SEVERITY: "How bad is the cough today?"      Worse and coughing frq.  3. SPUTUM: "Describe the color of your sputum" (none, dry cough; clear, white, yellow, green)     white 4. HEMOPTYSIS: "Are you coughing up any blood?" If so ask: "How much?" (flecks, streaks, tablespoons, etc.)     no 5. DIFFICULTY BREATHING: "Are you having difficulty breathing?" If Yes, ask: "How bad is it?" (e.g., mild, moderate, severe)    - MILD: No SOB at rest, mild SOB with walking, speaks normally in sentences, can lie down, no retractions, pulse < 100.    - MODERATE: SOB at rest, SOB with minimal exertion and prefers to sit, cannot lie down flat, speaks in phrases, mild retractions, audible wheezing, pulse 100-120.    - SEVERE: Very SOB at rest, speaks in single words, struggling to breathe, sitting hunched forward, retractions, pulse > 120      No SOB 6. FEVER: "Do you have a fever?" If Yes, ask: "What is your temperature, how was it measured, and when did it start?"     no 8. LUNG HISTORY: "Do you have any history of lung disease?"  (e.g., pulmonary embolus,  asthma, emphysema)     Chronic cough 10. OTHER SYMPTOMS: "Do you have any other symptoms?" (e.g., runny nose, wheezing, chest pain)       Hoarse, feels tight in chest, ribs sore   11. PREGNANCY: "Is there any chance you are pregnant?" "When was your last menstrual period?"       N/a 12. TRAVEL: "Have you traveled out of the country in the last month?" (e.g., travel history, exposures)       N/a  Protocols used: Cough - Acute Non-Productive-A-AH

## 2022-10-14 NOTE — Telephone Encounter (Signed)
Summary: cough and congestion   Pt called with cough, congestion, no fever, she ask if something could be sent to the pharmacy for cough  [facility]  CB#  [phone]          Called patient to review sx. No answer, LVMTCB [phone]

## 2022-10-14 NOTE — Progress Notes (Signed)
Acute Office Visit   Patient: Lauren Johnston   DOB: 07/17/1951   72 y.o. Female  MRN: [id number] Visit Date: 10/14/2022  Today's healthcare provider: Dani Gobble Khamil Lamica, PA-C  Introduced myself to the patient as a Journalist, newspaper and provided education on APPs in clinical practice.    Chief Complaint  Patient presents with   Cough    And chest tightness   facial pressure    Sx since Friday   Subjective    HPI HPI     Cough    Additional comments: And chest tightness        facial pressure    Additional comments: Sx since Friday      Last edited by Salomon Fick, [place] on 10/14/2022  3:04 PM.        URI-type symptoms  Onset:sudden  Duration: since Friday   Associated symptoms:reports a "terrible cough", malaise, rib soreness during coughing , productive coughing, postnasal drainage   Interventions:Alkaseltzer, Robitussin with honey  Recent sick contacts: husband has similar symptoms and started on Friday as well   COVID testing at home: she has not tested at home due to expired tests   Results:NA        Medications: Outpatient Medications Prior to Visit  Medication Sig   acetaminophen (TYLENOL) 500 MG tablet Take 500 mg by mouth every 6 (six) hours as needed.   Artificial Tear Solution (SYSTANE CONTACTS) SOLN Apply to eye.   atorvastatin (LIPITOR) 40 MG tablet Take 1 tablet (40 mg total) by mouth at bedtime.   benzonatate (TESSALON) 200 MG capsule Take 1 capsule (200 mg total) by mouth at bedtime.   Biotin w/ Vitamins C & E (HAIR SKIN & NAILS GUMMIES PO) Take 2 tablets by mouth every other day.   Cholecalciferol (VITAMIN D) 50 MCG (2000 UT) CAPS Take 1 capsule (2,000 Units total) by mouth daily.   Cyanocobalamin (B-12) 500 MCG SUBL Place 1 tablet under the tongue daily at 12 noon.   diclofenac Sodium (VOLTAREN) 1 % GEL Apply 4 g topically 4 (four) times daily.   DULoxetine (CYMBALTA) 60 MG capsule Take 1 capsule (60 mg total) by mouth every  morning.   estradiol (ESTRACE) 0.5 MG tablet Take 1 tablet (0.5 mg total) by mouth daily.   fluticasone (FLONASE) 50 MCG/ACT nasal spray Place 2 sprays into both nostrils daily.   traZODone (DESYREL) 50 MG tablet Take 0.5-1 tablets (25-50 mg total) by mouth at bedtime as needed for sleep.   levothyroxine (SYNTHROID) 100 MCG tablet Take 1 tablet by mouth in the morning. Take 30-60 minutes before breakfast   memantine (NAMENDA) 10 MG tablet Take 1 tablet by mouth daily.   No facility-administered medications prior to visit.    Review of Systems  Constitutional:  Positive for fatigue. Negative for chills and fever.  HENT:  Positive for postnasal drip. Negative for congestion, ear pain, rhinorrhea, sinus pressure, sinus pain and sore throat.   Respiratory:  Positive for cough and chest tightness. Negative for shortness of breath and wheezing.   Gastrointestinal:  Negative for diarrhea, nausea and vomiting.  Musculoskeletal:  Positive for myalgias.  Neurological:  Negative for dizziness, light-headedness and headaches.       Objective    BP 120/78   Pulse 85   Temp 97.9 F (36.6 C) (Oral)   Resp 16   Ht 5' 6"$  (1.676 m)   Wt 199 lb 14.4 oz (90.7 kg)   SpO2  98%   BMI 32.26 kg/m    Physical Exam Vitals reviewed.  Constitutional:      General: She is awake.     Appearance: Normal appearance. She is well-developed and well-groomed.  HENT:     Head: Normocephalic and atraumatic.     Right Ear: Hearing and ear canal normal. Tympanic membrane is injected.     Left Ear: Hearing and ear canal normal. Tympanic membrane is injected.     Nose: Nose normal.     Mouth/Throat:     Lips: Pink.     Mouth: Mucous membranes are moist.     Pharynx: Oropharynx is clear. Uvula midline. Posterior oropharyngeal erythema present. No oropharyngeal exudate.     Tonsils: No tonsillar exudate or tonsillar abscesses.  Eyes:     Extraocular Movements: Extraocular movements intact.      Conjunctiva/sclera: Conjunctivae normal.  Cardiovascular:     Rate and Rhythm: Normal rate and regular rhythm.     Pulses: Normal pulses.          Radial pulses are 2+ on the right side and 2+ on the left side.     Heart sounds: Normal heart sounds. No murmur heard.    No friction rub. No gallop.  Pulmonary:     Effort: Pulmonary effort is normal.     Breath sounds: Normal breath sounds. No decreased air movement. No decreased breath sounds, wheezing, rhonchi or rales.  Musculoskeletal:     Cervical back: Full passive range of motion without pain, normal range of motion and neck supple.  Lymphadenopathy:     Head:     Right side of head: No submental, submandibular or preauricular adenopathy.     Left side of head: No submental, submandibular or preauricular adenopathy.     Cervical:     Right cervical: No superficial or posterior cervical adenopathy.    Left cervical: No superficial or posterior cervical adenopathy.     Upper Body:     Right upper body: No supraclavicular adenopathy.     Left upper body: No supraclavicular adenopathy.  Neurological:     General: No focal deficit present.     Mental Status: She is alert.  Psychiatric:        Mood and Affect: Mood normal.        Behavior: Behavior normal. Behavior is cooperative.        Thought Content: Thought content normal.        Judgment: Judgment normal.       Results for orders placed or performed in visit on 10/14/22  POCT Influenza A/B  Result Value Ref Range   Influenza A, POC Negative Negative   Influenza B, POC Negative Negative    Assessment & Plan      No follow-ups on file.       Problem List Items Addressed This Visit   None Visit Diagnoses     Upper respiratory tract infection, unspecified type    -  Primary Acute new concern Visit with patient indicates symptoms comprised of productive cough, chest tightness, body aches, fatigue  since Friday  congruent with acute URI that is likely viral in  nature  Patient has not tested for COVID at home.  Patient was tested for Flu and COVID in office today, Flu was negative - results reviewed with her during apt today, awaiting COVID results  Due to nature and duration of symptoms recommended treatment regimen is symptomatic relief and follow up if needed Discussed with  patient the various viral and bacterial etiologies of current illness and appropriate course of treatment Discussed OTC medication options for multisymptom relief such as Dayquil/Nyquil, Theraflu, AlkaSeltzer, etc. Will provide Tessalon pearls to assist with coughing and Medrol dose pack to assist with chest tightness and inflammation Reviewed Tylenol dosing and max per day with her - provided this on AVS at check out.  Discussed return precautions if symptoms are not improving or worsen over next 5-7 days.     Cough, unspecified type       Relevant Medications   benzonatate (TESSALON) 200 MG capsule   Other Relevant Orders   Novel Coronavirus, NAA ([facility])   POCT Influenza A/B (Completed)   Feeling of chest tightness       Relevant Medications   methylPREDNISolone (MEDROL DOSEPAK) 4 MG TBPK tablet        No follow-ups on file.   I, Ky Rumple E Annelle Behrendt, PA-C, have reviewed all documentation for this visit. The documentation on 10/14/22 for the exam, diagnosis, procedures, and orders are all accurate and complete.   Talitha Givens, MHS, PA-C [facility]

## 2022-10-14 NOTE — Patient Instructions (Addendum)
Based on your described symptoms and the duration of symptoms it is likely that you have a viral upper respiratory infection (often called a "cold")  Symptoms can last for 3-10 days with lingering cough and intermittent symptoms lasting weeks after that.  The goal of treatment at this time is to reduce your symptoms and discomfort   I have sent in Tessalon pearls for you to take twice per day to help with your cough  You can use over the counter medications such as Dayquil/Nyquil, AlkaSeltzer formulations, etc to provide further relief of symptoms according to the manufacturer's instructions  You can take Tylenol but this is usually in the Alkaseltzer formulation you are using. Please do not take more than 3500 mg of Tylenol per 24 hours (that includes Tylenol by itself and what's in the combo medications).  You can continue to use Robitussin and warm tea with honey to help your symptoms I also recommend warm showers and a humidifier to help with nighttime symptoms  I am sending in a script for a Medrol dose pack as well to help with your breathing and inflammation. Please take the medication per the instructions on the pack. I recommend starting this in the morning to prevent sleeplessness.    If your symptoms do not improve or become worse in the next 5-7 days please make an apt at the office so we can see you  Go to the ER if you begin to have more serious symptoms such as shortness of breath, trouble breathing, loss of consciousness, swelling around the eyes, high fever, severe lasting headaches, vision changes or neck pain/stiffness.

## 2022-10-15 ENCOUNTER — Telehealth: Payer: Self-pay | Admitting: Family Medicine

## 2022-10-15 DIAGNOSIS — R059 Cough, unspecified: Secondary | ICD-10-CM | POA: Diagnosis not present

## 2022-10-15 NOTE — Telephone Encounter (Signed)
Copied from [place]. Topic: General - Inquiry >> Oct 15, 2022 11:30 AM Marcellus Scott wrote: Reason for CRM: Pt is calling requesting lab results.    Please advise.

## 2022-10-15 NOTE — Telephone Encounter (Signed)
Results are pending.

## 2022-10-16 LAB — NOVEL CORONAVIRUS, NAA: SARS-CoV-2, NAA: DETECTED — AB

## 2022-10-16 NOTE — Telephone Encounter (Signed)
Patient called wanting know the result of covid test and if something will be called into pharmacy.

## 2022-10-16 NOTE — Progress Notes (Signed)
COVID positive, At this time she is outside the recommended therapeutic window for COVID antivirals- they would not be very beneficial for her at this time. She should proceed with using over the counter medications to manage her symptoms as needed. She can call us for further concerns and if her symptoms are getting worse or not improving over the next 5-7 days.

## 2022-10-17 ENCOUNTER — Telehealth: Payer: Self-pay | Admitting: Family Medicine

## 2022-10-17 NOTE — Telephone Encounter (Unsigned)
Copied from [place]. Topic: General - Inquiry >> Oct 16, 2022  4:55 PM Rosanne Ashing P wrote: Reason for CRM: pt called asking about her covid results.  She says she wants medication if it is positive.  [place]  CB#  [phone]

## 2022-10-17 NOTE — Telephone Encounter (Signed)
Per Erin's result note: "COVID positive, At this time she is outside the recommended therapeutic window for COVID antivirals- they would not be very beneficial for her at this time. She should proceed with using over the counter medications to manage her symptoms as needed. She can call us for further concerns and if her symptoms are getting worse or not improving over the next 5-7 days."  Results given and patient gave verbal understanding

## 2022-12-12 ENCOUNTER — Other Ambulatory Visit: Payer: Self-pay | Admitting: Family Medicine

## 2022-12-12 DIAGNOSIS — F5101 Primary insomnia: Secondary | ICD-10-CM

## 2022-12-12 DIAGNOSIS — Z780 Asymptomatic menopausal state: Secondary | ICD-10-CM

## 2022-12-12 NOTE — Telephone Encounter (Signed)
Last appt was indc

## 2022-12-23 NOTE — Progress Notes (Unsigned)
Name: Lauren Johnston   MRN: [id number]    DOB: 1951-06-28   Date:12/24/2022       Progress Note  Subjective  Chief Complaint  Follow Up  HPI  Chronic cough: seen by Dr. Jayme Cloud - pulmonologist and   also seen by ENT. She had thyroidectomy 2021  and symptoms during the day improved, however continues to have nocturnal cough that wakes her up during the night - around 2-4 am, it feels like a tickle in her throat. She never smoked but exposed to second hand smoking . Negative PFT's. Dr. Willeen Cass gave her omeprazole but she was not able to tolerate it - caused her to get sexually aroused and she stopped it. Dr. Marcos Eke gave Zyrtec, we added Flonase and she also states Benzonate and mucinex. Symptoms are stable and chronic   OA both knees: stable, taking tylenol prn now, no effusion or redness. She has been wearing a soft brace Unchanged    Mild Cognitive Dysfunction: seeing Dr. Sherryll Burger, only able to tolerate Namenda 10 mg bin am and half at night as prescribed now. She still drives but sometimes feels insecure to leave her house alone. There is family history of dementia, mother and multiple siblings. She states her symptoms worse after COVID. 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 Lady Gary 04/12/2020 . She has a thyroidectomy 06/18/2020 , last TSH done by endo was at goal  , she asked me to check TSH for her today. She is taking medication as prescribed   Hyperlipidemia: taking Atorvastatin , reviewed last labs , LDL was 74 reminded her to eat more fish and continue  tree nuts daily.  We will recheck labs today    Insomnia she takes trazodone and seems to help her to  fall and stay asleep No side effects   CKI stage III: discussed avoiding NSAID's and drink more water, taking Tylenol prn Last GFR stable at 54   She denies pruritis or decrease , no problems voiding.  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 She still feels it is affecting her memory and has mental fogginess . She states her husband and girlfriends helps her with directions now. She saw Dr/ Sherryll Burger and was diagnosed with mild cognitive dysfunction , she is now on Namenda 10 mg in am and half in pm.She states no longer wants to get out of the house due to memory loss, but still goes to church on Sundays.   B12 deficiency: last level back to normal, continue supplementation   Major Depression: She is on her third marriage, first marriage was not good, her twins were 7 when he left them, second marriage was good but he died when she was 72 yo, she is on 8 rd marriage and together for the past few years, they re-united in a high school reunion in 2016 and married since 2018 . She continues to feel upset about her memory, she sates her children don't understand it Husband is supportive . 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 10/15/2020   Pure hypercholesterolemia 10/15/2020   Stage 3a chronic kidney disease 10/15/2020   S/P total thyroidectomy 06/18/2020   Class 1 obesity in adult 11/11/2016   Osteopenia 10/09/2016   Rectal polyp    Right wrist tendonitis 05/16/2016   Osteoarthritis of left hip 05/16/2016   Vitamin D deficiency 02/05/2016   Degeneration of intervertebral disc of cervical region 02/05/2016   Insomnia 04/23/2015   Major depression in remission (HCC) 04/23/2015   Chronic pain 04/23/2015   Goiter, nontoxic, 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   Hypothyroidism, 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   Uterine leiomyoma 06/16/2007    Past Surgical History:  Procedure Laterality Date    ABDOMINAL HYSTERECTOMY     BILATERAL OOPHORECTOMY     COLONOSCOPY WITH PROPOFOL N/A 06/02/2016   Procedure: COLONOSCOPY WITH PROPOFOL;  Surgeon: Midge Minium, MD;  Location: [facility];  Service: Endoscopy;  Laterality: N/A;   COLONOSCOPY WITH PROPOFOL N/A 06/21/2021   Procedure: COLONOSCOPY WITH PROPOFOL;  Surgeon: Midge Minium, MD;  Location: [facility];  Service: Endoscopy;  Laterality: N/A;   dequavian tendonitis Left    KNEE ARTHROSCOPY Bilateral    POLYPECTOMY  06/02/2016   Procedure: POLYPECTOMY;  Surgeon: Midge Minium, MD;  Location: [facility];  Service: Endoscopy;;   THYROIDECTOMY N/A 06/18/2020   Procedure: THYROIDECTOMY, total;  Surgeon: Duanne Guess, MD;  Location: [facility];  Service: General;  Laterality: N/A;   TONSILLECTOMY     AGE 72 OR 11    Family History  Problem Relation Age of Onset   Diabetes Mother    Heart disease Mother    Emphysema Father    Heart disease Father    COPD Father    Multiple myeloma Brother    Alzheimer's disease Sister    Dementia Sister     Social History   Tobacco Use   Smoking status: Never   Smokeless tobacco: Never  Substance Use Topics   Alcohol use: No    Alcohol/week: 0.0 standard drinks of alcohol     Current Outpatient Medications:    acetaminophen (TYLENOL) 500 MG tablet, Take 500 mg by mouth every 6 (six) hours as needed., Disp: , Rfl:    Artificial Tear Solution (SYSTANE CONTACTS) SOLN, Apply to eye., Disp: , Rfl:    atorvastatin (LIPITOR) 40 MG tablet, Take 1 tablet (40 mg total) by mouth at bedtime., Disp: 90 tablet, Rfl: 1   Biotin w/ Vitamins C & E (HAIR SKIN & NAILS GUMMIES PO), Take 2 tablets by mouth every other day., Disp: , Rfl:    Cholecalciferol (VITAMIN D) 50 MCG (2000 UT) CAPS, Take 1 capsule (2,000 Units total) by mouth daily., Disp: 100 capsule, Rfl: 1   Cyanocobalamin (B-12) 500 MCG SUBL, Place 1 tablet under the tongue daily at 12 noon., Disp: 100 tablet, Rfl: 1    diclofenac Sodium (VOLTAREN) 1 % GEL, Apply 4 g topically 4 (four) times daily., Disp: 100 g, Rfl: 1   DULoxetine (CYMBALTA) 60 MG capsule, Take 1 capsule (60 mg total) by mouth every morning., Disp: 90 capsule, Rfl: 1   estradiol (ESTRACE) 0.5 MG tablet, TAKE ONE TABLET BY MOUTH EVERY DAY, Disp: 90 tablet, Rfl: 0   fluticasone (FLONASE) 50 MCG/ACT nasal spray, Place 2 sprays into both nostrils daily., Disp: 48 g, Rfl: 1   traZODone (DESYREL) 50 MG tablet, TAKE ONE-HALF TO ONE TABLET(S) BY MOUTH EVERY DAY AT BEDTIME AS NEEDED FOR SLEEP, Disp: 90 tablet, Rfl: 0  benzonatate (TESSALON) 200 MG capsule, Take 1 capsule (200 mg total) by mouth at bedtime. (Patient not taking: Reported on 12/24/2022), Disp: 90 capsule, Rfl: 1   benzonatate (TESSALON) 200 MG capsule, Take 1 capsule (200 mg total) by mouth 2 (two) times daily as needed for cough. (Patient not taking: Reported on 12/24/2022), Disp: 20 capsule, Rfl: 0   levothyroxine (SYNTHROID) 100 MCG tablet, Take 1 tablet by mouth in the morning. Take 30-60 minutes before breakfast, Disp: , Rfl:    memantine (NAMENDA) 10 MG tablet, Take 1 tablet by mouth daily., Disp: , Rfl:    methylPREDNISolone (MEDROL DOSEPAK) 4 MG TBPK tablet, Take by mouth daily. (Patient not taking: Reported on 12/24/2022), Disp: 21 each, Rfl: 0  Allergies  Allergen Reactions   Sulfamethoxazole-Trimethoprim Other (See Comments) and Rash   Citalopram Itching   Omeprazole Palpitations    Also weakness   Tetracycline Other (See Comments)    Other Reaction: URTICARIA   Tetracyclines & Related Itching   Codeine Itching    I personally reviewed active problem list, medication list, allergies, family history, social history, health maintenance with the patient/caregiver today.   ROS  Ten systems reviewed and is negative except as mentioned in HPI   Objective  Vitals:   12/24/22 0952  BP: 122/74  Pulse: 89  Resp: 16  SpO2: 95%  Weight: 201 lb (91.2 kg)  Height: 5\' 7"   (1.702 m)    Body mass index is 31.48 kg/m.  Physical Exam  Constitutional: Patient appears well-developed and well-nourished. Obese  No distress.  HEENT: head atraumatic, normocephalic, pupils equal and reactive to light, neck supple Cardiovascular: Normal rate, regular rhythm and normal heart sounds.  No murmur heard. No BLE edema. Pulmonary/Chest: Effort normal and breath sounds normal. No respiratory distress. Abdominal: Soft.  There is no tenderness. Psychiatric: Patient has a normal mood and affect. behavior is normal. Judgment and thought content normal.   Recent Results (from the past 2160 hour(s))  POCT Influenza A/B     Status: None   Collection Time: 10/14/22  3:21 PM  Result Value Ref Range   Influenza A, POC Negative Negative   Influenza B, POC Negative Negative  Novel Coronavirus, NAA ([facility])     Status: Abnormal   Collection Time: 10/15/22  4:33 PM   Specimen: Nasopharyngeal(NP) swabs in vial transport medium   Nasopharynge  Resident  Result Value Ref Range   SARS-CoV-2, NAA Detected (A) Not Detected    Comment: Patients who have a positive COVID-19 test result may now have treatment options. Treatment options are available for patients with mild to moderate symptoms and for hospitalized patients. Visit our website at [email or web address] for resources and information. This nucleic acid amplification test was developed and its performance characteristics determined by [employer]. Nucleic acid amplification tests include RT-PCR and TMA. This test has not been FDA cleared or approved. This test has been authorized by FDA under an Emergency Use Authorization (EUA). This test is only authorized for the duration of time the declaration that circumstances exist justifying the authorization of the emergency use of in vitro diagnostic tests for detection of SARS-CoV-2 virus and/or diagnosis of COVID-19 infection under section 564(b)(1) of the Act,  21 U.S.C. 161WRU-0(A) (1), unless the authorization is terminated or revoked sooner. When diagnostic testing is negativ e, the possibility of a false negative result should be considered in the context of a patient's recent exposures and the presence of clinical signs and symptoms consistent with COVID-19. An individual  without symptoms of COVID-19 and who is not shedding SARS-CoV-2 virus would expect to have a negative (not detected) result in this assay.     PHQ2/9:    12/24/2022    9:52 AM 10/14/2022    2:55 PM 08/20/2022    9:56 AM 08/07/2022   10:54 AM 06/25/2022   10:26 AM  Depression screen PHQ 2/9  Decreased Interest 3 0 2 0 3  Down, Depressed, Hopeless 1 0 2 0 3  PHQ - 2 Score 4 0 4 0 6  Altered sleeping 3 0 0 0 0  Tired, decreased energy 0 0 0 0 1  Change in appetite 0 0 0 0 0  Feeling bad or failure about yourself  0 0 0 0 1  Trouble concentrating 3 0 3 0 0  Moving slowly or fidgety/restless 0 0 0 0 0  Suicidal thoughts 0 0 0 0 0  PHQ-9 Score 10 0 7 0 8  Difficult doing work/chores  Not difficult at all   Somewhat difficult    phq 9 is positive   Fall Risk:    12/24/2022    9:51 AM 10/14/2022    2:55 PM 08/20/2022    9:56 AM 06/25/2022   10:26 AM 02/19/2022    9:32 AM  Fall Risk   Falls in the past year? 0 0 0 0 0  Number falls in past yr: 0 0 0 0 0  Injury with Fall? 0 0 0 0 0  Risk for fall due to : No Fall Risks No Fall Risks No Fall Risks No Fall Risks No Fall Risks  Follow up Falls prevention discussed Falls prevention discussed;Education provided;Falls evaluation completed Falls prevention discussed Falls prevention discussed;Education provided Falls prevention discussed      Functional Status Survey: Is the patient deaf or have difficulty hearing?: No Does the patient have difficulty seeing, even when wearing glasses/contacts?: No Does the patient have difficulty concentrating, remembering, or making decisions?: Yes Does the patient have  difficulty walking or climbing stairs?: Yes Does the patient have difficulty dressing or bathing?: No Does the patient have difficulty doing errands alone such as visiting a doctor's office or shopping?: No    Assessment & Plan  1. Moderate episode of recurrent major depressive disorder  Needs to see a therapist  - DULoxetine (CYMBALTA) 60 MG capsule; Take 1 capsule (60 mg total) by mouth every morning.  Dispense: 90 capsule; Refill: 1  2. Mild cognitive impairment  Needs to follow up with Dr. Sherryll Burger   3. Chronic cough  - benzonatate (TESSALON) 200 MG capsule; Take 1 capsule (200 mg total) by mouth at bedtime.  Dispense: 90 capsule; Refill: 1  4. Pure hypercholesterolemia  - atorvastatin (LIPITOR) 40 MG tablet; Take 1 tablet (40 mg total) by mouth at bedtime.  Dispense: 90 tablet; Refill: 1 - Lipid panel  5. Primary insomnia  On Trazodone  6. B12 deficiency  - CBC with Differential/Platelet - B12 and Folate Panel  7. Vitamin D deficiency  - VITAMIN D 25 Hydroxy (Vit-D Deficiency, Fractures)  8. Primary osteoarthritis of both knees  Seen by ortho in the past   9. Post-surgical hypothyroidism  - TSH  10. Stage 3a chronic kidney disease  We will recheck labs   11. Long-term use of high-risk medication  - COMPLETE METABOLIC PANEL WITH GFR

## 2022-12-24 ENCOUNTER — Encounter: Payer: Self-pay | Admitting: Family Medicine

## 2022-12-24 ENCOUNTER — Ambulatory Visit (INDEPENDENT_AMBULATORY_CARE_PROVIDER_SITE_OTHER): Payer: Medicare Other | Admitting: Family Medicine

## 2022-12-24 VITALS — BP 122/74 | HR 89 | Resp 16 | Ht 67.0 in | Wt 201.0 lb

## 2022-12-24 DIAGNOSIS — F331 Major depressive disorder, recurrent, moderate: Secondary | ICD-10-CM | POA: Diagnosis not present

## 2022-12-24 DIAGNOSIS — M170 Bilateral primary osteoarthritis of knee: Secondary | ICD-10-CM

## 2022-12-24 DIAGNOSIS — Z79899 Other long term (current) drug therapy: Secondary | ICD-10-CM

## 2022-12-24 DIAGNOSIS — E559 Vitamin D deficiency, unspecified: Secondary | ICD-10-CM | POA: Diagnosis not present

## 2022-12-24 DIAGNOSIS — R053 Chronic cough: Secondary | ICD-10-CM | POA: Diagnosis not present

## 2022-12-24 DIAGNOSIS — E7800 Pure hypercholesterolemia, unspecified: Secondary | ICD-10-CM

## 2022-12-24 DIAGNOSIS — E538 Deficiency of other specified B group vitamins: Secondary | ICD-10-CM | POA: Diagnosis not present

## 2022-12-24 DIAGNOSIS — G3184 Mild cognitive impairment, so stated: Secondary | ICD-10-CM

## 2022-12-24 DIAGNOSIS — N1831 Chronic kidney disease, stage 3a: Secondary | ICD-10-CM

## 2022-12-24 DIAGNOSIS — F5101 Primary insomnia: Secondary | ICD-10-CM

## 2022-12-24 DIAGNOSIS — E890 Postprocedural hypothyroidism: Secondary | ICD-10-CM | POA: Diagnosis not present

## 2022-12-24 MED ORDER — ATORVASTATIN CALCIUM 40 MG PO TABS: 40.0000 mg | ORAL_TABLET | Freq: Every day | ORAL | 1 refills | Status: DC

## 2022-12-24 MED ORDER — DULOXETINE HCL 60 MG PO CPEP: 60.0000 mg | ORAL_CAPSULE | ORAL | 1 refills | Status: DC

## 2022-12-24 MED ORDER — BENZONATATE 200 MG PO CAPS: 200.0000 mg | ORAL_CAPSULE | Freq: Every evening | ORAL | 1 refills | Status: DC

## 2022-12-24 NOTE — Patient Instructions (Addendum)
Please contact Dr. Sherryll Burger - neurologist for follow up  Dr. Gershon Crane for Thyroid management   Contact insurance to find out if they cover therapy visit and make an appointment for memory changes and depression

## 2022-12-25 ENCOUNTER — Ambulatory Visit: Payer: Medicare Other | Admitting: Family Medicine

## 2022-12-25 LAB — CBC WITH DIFFERENTIAL/PLATELET
Absolute Monocytes: 403 cells/uL (ref 200–950)
Basophils Absolute: 38 cells/uL (ref 0–200)
Basophils Relative: 0.8 %
Eosinophils Absolute: 245 cells/uL (ref 15–500)
Eosinophils Relative: 5.1 %
HCT: 38.7 % (ref 35.0–45.0)
Hemoglobin: 12.7 g/dL (ref 11.7–15.5)
Lymphs Abs: 1454 cells/uL (ref 850–3900)
MCH: 30.6 pg (ref 27.0–33.0)
MCHC: 32.8 g/dL (ref 32.0–36.0)
MCV: 93.3 fL (ref 80.0–100.0)
MPV: 10.1 fL (ref 7.5–12.5)
Monocytes Relative: 8.4 %
Neutro Abs: 2659 cells/uL (ref 1500–7800)
Neutrophils Relative %: 55.4 %
Platelets: 294 10*3/uL (ref 140–400)
RBC: 4.15 10*6/uL (ref 3.80–5.10)
RDW: 13 % (ref 11.0–15.0)
Total Lymphocyte: 30.3 %
WBC: 4.8 10*3/uL (ref 3.8–10.8)

## 2022-12-25 LAB — LIPID PANEL
Cholesterol: 143 mg/dL (ref <200)
HDL: 48 mg/dL — ABNORMAL LOW (ref >=50)
LDL Cholesterol (Calc): 81 mg/dL (calc)
Non-HDL Cholesterol (Calc): 95 mg/dL (calc) (ref <130)
Total CHOL/HDL Ratio: 3 (calc) (ref <5.0)
Triglycerides: 67 mg/dL (ref <150)

## 2022-12-25 LAB — COMPLETE METABOLIC PANEL WITH GFR
AG Ratio: 1.3 (calc) (ref 1.0–2.5)
ALT: 10 U/L (ref 6–29)
AST: 17 U/L (ref 10–35)
Albumin: 3.5 g/dL — ABNORMAL LOW (ref 3.6–5.1)
Alkaline phosphatase (APISO): 68 U/L (ref 37–153)
BUN/Creatinine Ratio: 11 (calc) (ref 6–22)
BUN: 12 mg/dL (ref 7–25)
CO2: 30 mmol/L (ref 20–32)
Calcium: 8.9 mg/dL (ref 8.6–10.4)
Chloride: 104 mmol/L (ref 98–110)
Creat: 1.11 mg/dL — ABNORMAL HIGH (ref 0.60–1.00)
Globulin: 2.8 g/dL (calc) (ref 1.9–3.7)
Glucose, Bld: 91 mg/dL (ref 65–99)
Potassium: 3.9 mmol/L (ref 3.5–5.3)
Sodium: 141 mmol/L (ref 135–146)
Total Bilirubin: 0.4 mg/dL (ref 0.2–1.2)
Total Protein: 6.3 g/dL (ref 6.1–8.1)
eGFR: 53 mL/min/{1.73_m2} — ABNORMAL LOW (ref >=60)

## 2022-12-25 LAB — B12 AND FOLATE PANEL
Folate: 5.8 ng/mL
Vitamin B-12: 287 pg/mL (ref 200–1100)

## 2022-12-25 LAB — VITAMIN D 25 HYDROXY (VIT D DEFICIENCY, FRACTURES): Vit D, 25-Hydroxy: 52 ng/mL (ref 30–100)

## 2022-12-25 LAB — TSH: TSH: 10.65 mIU/L — ABNORMAL HIGH (ref 0.40–4.50)

## 2023-01-08 DIAGNOSIS — G3184 Mild cognitive impairment, so stated: Secondary | ICD-10-CM | POA: Diagnosis not present

## 2023-01-08 DIAGNOSIS — F411 Generalized anxiety disorder: Secondary | ICD-10-CM | POA: Diagnosis not present

## 2023-01-08 DIAGNOSIS — U099 Post covid-19 condition, unspecified: Secondary | ICD-10-CM | POA: Diagnosis not present

## 2023-01-15 ENCOUNTER — Ambulatory Visit: Payer: Self-pay

## 2023-01-15 NOTE — Telephone Encounter (Signed)
Message from [place] sent at 01/15/2023  9:45 AM EDT  Summary: med not working/cough   Pt called in says still has cough and can't sleep at night, the pearl pills she was given isnt working;. She is asking for something else to be sent to pharmacy. [id number] [place], [place] - [address] Phone: [phone] Fax: [phone]        Called  pt and LM on VM to call back to discuss issue.

## 2023-01-15 NOTE — Telephone Encounter (Signed)
Message from [place] sent at 01/15/2023  9:45 AM EDT  Summary: med not working/cough   Pt called in says still has cough and can't sleep at night, the pearl pills she was given isnt working;. She is asking for something else to be sent to pharmacy. [id number] [place], [place] - [address] Phone: [phone] Fax: [phone]        Called pt and LM on VM to call back to discuss sx.

## 2023-01-15 NOTE — Telephone Encounter (Signed)
  Chief Complaint: medication not helping-  benzonatate (TESSALON) 200 MG capsule  Symptoms: coughing at night- spitting sputum, causing lack of sleep Frequency: chronic since thyroid surgery   Disposition: [] ED /[] Urgent Care (no appt availability in office) / [] Appointment(In office/virtual)/ []  [place] [facility]/ [] Home Care/ [] Refused Recommended Disposition /[] [place] [facility]/ [x]  Follow-up with PCP Additional Notes: Patient is very frustrated that she is unable to get relief from cough at night- she states she does not sleep due to constant cough and spitting phlegm all night. Patient would like to know what next steps are- she can not function during the day for needing naps and fatigue  Reason for Disposition  [1] Caller has NON-URGENT medicine question about med that PCP prescribed AND [2] triager unable to answer question  Answer Assessment - Initial Assessment Questions 1. NAME of MEDICINE: "What medicine(s) are you calling about?"     Cough medication - benzonatate (TESSALON) 200 MG capsule (tessalon pearls)- is not helping the cough 2. QUESTION: "What is your question?" (e.g., double dose of medicine, side effect)     Cough and spitting- phlegm still present  3. PRESCRIBER: "Who prescribed the medicine?" Reason: if prescribed by specialist, call should be referred to that group.     PCP 4. SYMPTOMS: "Do you have any symptoms?" If Yes, ask: "What symptoms are you having?"  "How bad are the symptoms (e.g., mild, moderate, severe)      Coughing- chronic - patient would like to know what else she can do?  She is getting very frustrated- she reports no changes since her thyroid operation- patient is not sleeping and not able to do what she wants during the day- due to fatigue. Patient states she is up all night coughing and spitting.  Protocols used: Medication Question Call-A-AH

## 2023-01-16 NOTE — Telephone Encounter (Signed)
Called patient and she stated she has not spoke to her pulmonologist. She hasn't contacted them in 2 years so I gave her the providers name and location. She stated she will call them and verbalized understanding of making an appointment with them.

## 2023-02-16 ENCOUNTER — Ambulatory Visit: Payer: Self-pay | Admitting: *Deleted

## 2023-02-16 NOTE — Telephone Encounter (Signed)
Attempted to return her call.   Left a voicemail to call back to discuss symptoms with a nurse. 

## 2023-02-16 NOTE — Telephone Encounter (Signed)
Message from Randol Kern sent at 02/16/2023  2:29 PM EDT  Summary: Medication questions, cough   Patient called and has medication questions regarding her cough and benzonatate (TESSALON) 200 MG capsule          Call History   Type Contact Phone/Fax User  02/16/2023 02:28 PM EDT Phone (Incoming) Lauren Johnston, Lauren Johnston (Self) [phone] Judie Petit) Maryellen Pile, Dominiqu

## 2023-02-16 NOTE — Telephone Encounter (Signed)
Message from Randol Kern sent at 02/16/2023  2:29 PM EDT  Summary: Medication questions, cough   Patient called and has medication questions regarding her cough and benzonatate (TESSALON) 200 MG capsule         Chief Complaint: asking if can take dose of Tessalon 1 time during the day and 1 time at night  Symptoms: productive cough at night with clear phlegm Frequency: Chronic Pertinent Negatives: Patient denies Fever, SOB, chest pain  Disposition: [] ED /[] Urgent Care (no appt availability in office) / [] Appointment(In office/virtual)/ []  [place] [facility]/ [] Home Care/ [] Refused Recommended Disposition /[] [place] [facility]/ [x]  Follow-up with PCP Additional Notes: Pt requesting to take med twice daily and advised she will run out and will need larger amount of capsules called to [facility].  Reason for Disposition . [1] Caller has NON-URGENT medicine question about med that PCP prescribed AND [2] triager unable to answer question  Answer Assessment - Initial Assessment Questions 1. ONSET: "When did the cough begin?"      Chronic cough  2. SEVERITY: "How bad is the cough today?"      Has at night  3. SPUTUM: "Describe the color of your sputum" (none, dry cough; clear, white, yellow, green)     Clear  4. HEMOPTYSIS: "Are you coughing up any blood?" If so ask: "How much?" (flecks, streaks, tablespoons, etc.)     N/a 5. DIFFICULTY BREATHING: "Are you having difficulty breathing?" If Yes, ask: "How bad is it?" (e.g., mild, moderate, severe)    - MILD: No SOB at rest, mild SOB with walking, speaks normally in sentences, can lie down, no retractions, pulse < 100.    - MODERATE: SOB at rest, SOB with minimal exertion and prefers to sit, cannot lie down flat, speaks in phrases, mild retractions, audible wheezing, pulse 100-120.    - SEVERE: Very SOB at rest, speaks in single words, struggling to breathe, sitting hunched forward, retractions, pulse > 120      none 6. FEVER: "Do  you have a fever?" If Yes, ask: "What is your temperature, how was it measured, and when did it start?"     no  8. LUNG HISTORY: "Do you have any history of lung disease?"  (e.g., pulmonary embolus, asthma, emphysema)     none  10. OTHER SYMPTOMS: "Do you have any other symptoms?" (e.g., runny nose, wheezing, chest pain)       no  Answer Assessment - Initial Assessment Questions 1. NAME of MEDICINE: "What medicine(s) are you calling about?"     Tessalon Perels 2. QUESTION: "What is your question?" (e.g., double dose of medicine, side effect)     Wants to know if she can take a dose dueing the day as well as the night time 3. PRESCRIBER: "Who prescribed the medicine?" Reason: if prescribed by specialist, call should be referred to that group.     Dr Carlynn Purl 4. SYMPTOMS: "Do you have any symptoms?" If Yes, ask: "What symptoms are you having?"  "How bad are the symptoms (e.g., mild, moderate, severe)     Chronic cough  Protocols used: Cough - Acute Productive-A-AH, Medication Question Call-A-AH

## 2023-02-17 ENCOUNTER — Other Ambulatory Visit: Payer: Self-pay | Admitting: Family Medicine

## 2023-02-17 DIAGNOSIS — R053 Chronic cough: Secondary | ICD-10-CM

## 2023-02-17 MED ORDER — BENZONATATE 200 MG PO CAPS
200.0000 mg | ORAL_CAPSULE | Freq: Two times a day (BID) | ORAL | 1 refills | Status: DC | PRN
Start: 2023-02-17 — End: 2023-09-14

## 2023-02-25 DIAGNOSIS — M170 Bilateral primary osteoarthritis of knee: Secondary | ICD-10-CM | POA: Diagnosis not present

## 2023-04-13 DIAGNOSIS — R053 Chronic cough: Secondary | ICD-10-CM | POA: Diagnosis not present

## 2023-04-13 DIAGNOSIS — K219 Gastro-esophageal reflux disease without esophagitis: Secondary | ICD-10-CM | POA: Diagnosis not present

## 2023-05-28 DIAGNOSIS — G3184 Mild cognitive impairment, so stated: Secondary | ICD-10-CM | POA: Diagnosis not present

## 2023-05-28 DIAGNOSIS — U099 Post covid-19 condition, unspecified: Secondary | ICD-10-CM | POA: Diagnosis not present

## 2023-05-28 DIAGNOSIS — E538 Deficiency of other specified B group vitamins: Secondary | ICD-10-CM | POA: Diagnosis not present

## 2023-06-01 ENCOUNTER — Other Ambulatory Visit: Payer: Self-pay | Admitting: Family Medicine

## 2023-06-01 DIAGNOSIS — Z780 Asymptomatic menopausal state: Secondary | ICD-10-CM

## 2023-06-01 DIAGNOSIS — F5101 Primary insomnia: Secondary | ICD-10-CM

## 2023-06-03 ENCOUNTER — Ambulatory Visit: Payer: Medicare Other | Attending: Student | Admitting: Speech Pathology

## 2023-06-03 DIAGNOSIS — R41841 Cognitive communication deficit: Secondary | ICD-10-CM | POA: Insufficient documentation

## 2023-06-03 NOTE — Therapy (Unsigned)
OUTPATIENT SPEECH LANGUAGE PATHOLOGY  COGNITION EVALUATION   Patient Name: Lauren Johnston MRN: [id number] DOB:[birth year], 72 y.o., female Today's Date: 06/03/2023  PCP: Alba Cory, MD REFERRING PROVIDER: Janice Coffin, PA   End of Session - 06/03/23 1604     Visit Number 1    Number of Visits 9    Date for SLP Re-Evaluation 07/01/23    Authorization Type [employer] [facility] Medicare    Progress Note Due on Visit 10    SLP Start Time 1400    SLP Stop Time  1445    SLP Time Calculation (min) 45 min    Activity Tolerance Patient tolerated treatment well             Past Medical History:  Diagnosis Date   Arthritis    hips   Chronic kidney disease    STAGE 3   Dental crowns present    dental implants - upper   Depression    GERD (gastroesophageal reflux disease)    Hyperlipidemia    Hypothyroidism    Thyroid disease    Past Surgical History:  Procedure Laterality Date   ABDOMINAL HYSTERECTOMY     BILATERAL OOPHORECTOMY     COLONOSCOPY WITH PROPOFOL N/A 06/02/2016   Procedure: COLONOSCOPY WITH PROPOFOL;  Surgeon: Midge Minium, MD;  Location: [facility];  Service: Endoscopy;  Laterality: N/A;   COLONOSCOPY WITH PROPOFOL N/A 06/21/2021   Procedure: COLONOSCOPY WITH PROPOFOL;  Surgeon: Midge Minium, MD;  Location: [facility];  Service: Endoscopy;  Laterality: N/A;   dequavian tendonitis Left    KNEE ARTHROSCOPY Bilateral    POLYPECTOMY  06/02/2016   Procedure: POLYPECTOMY;  Surgeon: Midge Minium, MD;  Location: [facility];  Service: Endoscopy;;   THYROIDECTOMY N/A 06/18/2020   Procedure: THYROIDECTOMY, total;  Surgeon: Duanne Guess, MD;  Location: [facility];  Service: General;  Laterality: N/A;   TONSILLECTOMY     AGE 48 OR 11   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   Chronic cough 09/11/2021   History of colonic polyps    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 10/15/2020   Pure hypercholesterolemia 10/15/2020   Stage 3a chronic kidney disease (HCC) 10/15/2020   S/P total thyroidectomy 06/18/2020   Class 1 obesity in adult 11/11/2016   Osteopenia 10/09/2016   Rectal polyp    Right wrist tendonitis 05/16/2016   Osteoarthritis of left hip 05/16/2016   Vitamin D deficiency 02/05/2016   Degeneration of intervertebral disc of cervical region 02/05/2016   Insomnia 04/23/2015   Major depression in remission (HCC) 04/23/2015   Chronic pain 04/23/2015   Goiter, nontoxic, 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   Hypothyroidism, 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   Uterine leiomyoma 06/16/2007    ONSET DATE: 05/29/2023 date of referral    REFERRING DIAG: R41.841 (ICD-10-CM) - Cognitive communication deficit G31. 84 (ICD-1-CM) - MCI (Mild Cognitive Impairment)   THERAPY DIAG:  Cognitive communication deficit  Rationale for Evaluation and Treatment Rehabilitation  SUBJECTIVE:   SUBJECTIVE STATEMENT: Pt pleasant, eager Pt accompanied by: self and significant other  PERTINENT HISTORY: ***  DIAGNOSTIC FINDINGS: ***  PAIN:  Are you having pain? No   FALLS: Has patient fallen in last 6 months?  No  LIVING ENVIRONMENT: Lives with: lives with their spouse Lives in: House/apartment  PLOF:  Level of assistance: Independent with ADLs, Independent with IADLs Employment: Retired   PATIENT GOALS   to assess for cognitive decline  vs normal aging  OBJECTIVE:   COGNITIVE COMMUNICATION Overall cognitive status: Within functional limits for tasks assessed Functional Impairments: repeating information, going into room and unable to re-call what she was entering room to get  AUDITORY COMPREHENSION  Overall auditory comprehension: Appears intact YES/NO questions: Appears intact Following directions: Appears intact Conversation: Moderately Complex  READING COMPREHENSION: Intact  EXPRESSION: verbal  VERBAL EXPRESSION:   Overall verbal expression: Appears  intact Level of generative/spontaneous verbalization: conversation Automatic speech: name: intact and social response: intact  Repetition: Appears intact Pragmatics: Appears intact Non-verbal means of communication: N/A  WRITTEN EXPRESSION: Dominant hand: right Written expression: Appears intact  ORAL MOTOR EXAMINATION Facial : WFL Lingual: WFL Velum: WFL Mandible: WFL Cough: WFL Voice: WFL  MOTOR SPEECH: Overall motor speech: Appears intact Respiration: diaphragmatic/abdominal breathing Phonation: normal Resonance: WFL Articulation: Appears intact Intelligibility: Intelligible Motor planning: Appears intact  STANDARDIZED ASSESSMENTS: Addenbrooke's Cognitive Examination - ACE III The Addenbrooke's Cognitive Examination-III (ACE-III) is a brief cognitive test that assesses five cognitive domains. The total score is 100 with higher scores indicating better cognitive functioning. Cut off scores of 88 and 82 are recommended for suspicion of dementia (88 has sensitivity of 1.00 and specificity of 0.96, 82 has sensitivity of 0.93 and specificity of 1.00). American Version ***  Attention ***/18  Memory ***/26  Fluency ***/14  Language ***/26  Visuospatial ***/16  TOTAL ACE- III Score ***/100     PATIENT REPORTED OUTCOME MEASURES (PROM): To be administered over the next 3 sessions   TODAY'S TREATMENT:  N/A   PATIENT EDUCATION: Education details: *** Person educated: {Person educated:25204} Education method: {Education Method:25205} Education comprehension: {Education Comprehension:25206}   HOME EXERCISE PROGRAM:   N/A   GOALS:  Goals reviewed with patient? Yes  SHORT TERM GOALS: Target date: 10 sessions  *** Baseline: Goal status: {GOALSTATUS:25110}  2.  *** Baseline:  Goal status: {GOALSTATUS:25110}  3.  *** Baseline:  Goal status: {GOALSTATUS:25110}  4.  *** Baseline:  Goal status: {GOALSTATUS:25110}  5.  *** Baseline:  Goal status:  {GOALSTATUS:25110}  6.  *** Baseline:  Goal status: {GOALSTATUS:25110}  LONG TERM GOALS: Target date: 07/01/2023  *** Baseline:  Goal status: {GOALSTATUS:25110}  2.  *** Baseline:  Goal status: {GOALSTATUS:25110}  3.  *** Baseline:  Goal status: {GOALSTATUS:25110}  4.  *** Baseline:  Goal status: {GOALSTATUS:25110}  5.  *** Baseline:  Goal status: {GOALSTATUS:25110}  6.  *** Baseline:  Goal status: {GOALSTATUS:25110}  ASSESSMENT:  CLINICAL IMPRESSION: Patient is a *** y.o. *** who was seen today for ***.   OBJECTIVE IMPAIRMENTS include {SLPOBJIMP:27107}. These impairments are limiting patient from {SLPLIMIT:27108}. Factors affecting potential to achieve goals and functional outcome are {SLP factors:25450}. Patient will benefit from skilled SLP services to address above impairments and improve overall function.  REHAB POTENTIAL: {rehabpotential:25112}  PLAN: SLP FREQUENCY: {rehab frequency:25116}  SLP DURATION: {rehab duration:25117}  PLANNED INTERVENTIONS: {SLP treatment/interventions:25449}  Keiran Sias B. Dreama Saa, M.S., CCC-SLP, Tree surgeon Certified Brain Injury Specialist [facility]  [facility] Rehabilitation Services Office [phone] Ascom [phone] Fax [phone]

## 2023-06-09 ENCOUNTER — Telehealth: Payer: Self-pay | Admitting: Speech Pathology

## 2023-06-09 ENCOUNTER — Ambulatory Visit: Payer: Medicare Other | Admitting: Speech Pathology

## 2023-06-09 NOTE — Telephone Encounter (Signed)
Pt was a no call, no show.   This writer lvm on number provided in pt's chart. Also left message with time and date of upcoming appt.   Mercy Leppla B. Dreama Saa, M.S., CCC-SLP, Tree surgeon Certified Brain Injury Specialist [facility]  [facility] Rehabilitation Services Office [phone] Ascom [phone] Fax [phone]

## 2023-06-11 ENCOUNTER — Ambulatory Visit: Payer: Medicare Other | Admitting: Speech Pathology

## 2023-06-11 DIAGNOSIS — R41841 Cognitive communication deficit: Secondary | ICD-10-CM | POA: Diagnosis not present
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
## 2023-06-15 DIAGNOSIS — K219 Gastro-esophageal reflux disease without esophagitis: Secondary | ICD-10-CM | POA: Diagnosis not present

## 2023-06-17 ENCOUNTER — Ambulatory Visit: Payer: Medicare Other | Admitting: Speech Pathology

## 2023-06-23 ENCOUNTER — Ambulatory Visit: Payer: Medicare Other | Admitting: Speech Pathology

## 2023-06-25 ENCOUNTER — Ambulatory Visit: Payer: Medicare Other | Admitting: Speech Pathology
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
## 2023-06-30 ENCOUNTER — Encounter: Payer: Self-pay | Admitting: Family Medicine

## 2023-06-30 ENCOUNTER — Ambulatory Visit (INDEPENDENT_AMBULATORY_CARE_PROVIDER_SITE_OTHER): Payer: Medicare Other | Admitting: Family Medicine

## 2023-06-30 ENCOUNTER — Ambulatory Visit: Payer: Medicare Other | Admitting: Speech Pathology

## 2023-06-30 VITALS — BP 122/70 | HR 89 | Resp 16 | Ht 67.0 in | Wt 198.0 lb

## 2023-06-30 DIAGNOSIS — R053 Chronic cough: Secondary | ICD-10-CM

## 2023-06-30 DIAGNOSIS — E890 Postprocedural hypothyroidism: Secondary | ICD-10-CM

## 2023-06-30 DIAGNOSIS — E538 Deficiency of other specified B group vitamins: Secondary | ICD-10-CM | POA: Diagnosis not present

## 2023-06-30 DIAGNOSIS — Z23 Encounter for immunization: Secondary | ICD-10-CM

## 2023-06-30 DIAGNOSIS — E7800 Pure hypercholesterolemia, unspecified: Secondary | ICD-10-CM

## 2023-06-30 DIAGNOSIS — G3184 Mild cognitive impairment, so stated: Secondary | ICD-10-CM

## 2023-06-30 DIAGNOSIS — I739 Peripheral vascular disease, unspecified: Secondary | ICD-10-CM | POA: Diagnosis not present

## 2023-06-30 DIAGNOSIS — F331 Major depressive disorder, recurrent, moderate: Secondary | ICD-10-CM | POA: Diagnosis not present

## 2023-06-30 DIAGNOSIS — N1831 Chronic kidney disease, stage 3a: Secondary | ICD-10-CM

## 2023-06-30 DIAGNOSIS — E559 Vitamin D deficiency, unspecified: Secondary | ICD-10-CM

## 2023-06-30 MED ORDER — FLUTICASONE PROPIONATE 50 MCG/ACT NA SUSP
2.0000 | Freq: Every evening | NASAL | 1 refills | Status: AC
Start: 2023-06-30 — End: ?

## 2023-07-01 LAB — COMPLETE METABOLIC PANEL WITH GFR
AG Ratio: 1.3 (calc) (ref 1.0–2.5)
ALT: 16 U/L (ref 6–29)
AST: 19 U/L (ref 10–35)
Albumin: 3.8 g/dL (ref 3.6–5.1)
Alkaline phosphatase (APISO): 84 U/L (ref 37–153)
BUN/Creatinine Ratio: 10 (calc) (ref 6–22)
BUN: 11 mg/dL (ref 7–25)
CO2: 30 mmol/L (ref 20–32)
Calcium: 9.1 mg/dL (ref 8.6–10.4)
Chloride: 103 mmol/L (ref 98–110)
Creat: 1.07 mg/dL — ABNORMAL HIGH (ref 0.60–1.00)
Globulin: 2.9 g/dL (ref 1.9–3.7)
Glucose, Bld: 88 mg/dL (ref 65–99)
Potassium: 3.9 mmol/L (ref 3.5–5.3)
Sodium: 141 mmol/L (ref 135–146)
Total Bilirubin: 0.8 mg/dL (ref 0.2–1.2)
Total Protein: 6.7 g/dL (ref 6.1–8.1)
eGFR: 55 mL/min/{1.73_m2} — ABNORMAL LOW (ref >=60)

## 2023-07-01 LAB — TSH: TSH: 0.42 m[IU]/L (ref 0.40–4.50)

## 2023-07-01 LAB — VITAMIN B12: Vitamin B-12: 1137 pg/mL — ABNORMAL HIGH (ref 200–1100)

## 2023-07-02 ENCOUNTER — Encounter: Payer: Medicare Other | Admitting: Speech Pathology

## 2023-07-07 ENCOUNTER — Ambulatory Visit: Payer: Medicare Other | Admitting: Speech Pathology

## 2023-07-09 ENCOUNTER — Encounter: Payer: Self-pay | Admitting: Pulmonary Disease

## 2023-07-09 ENCOUNTER — Encounter: Payer: Medicare Other | Admitting: Speech Pathology

## 2023-07-14 ENCOUNTER — Encounter: Payer: Medicare Other | Admitting: Speech Pathology

## 2023-07-16 ENCOUNTER — Encounter: Payer: Medicare Other | Admitting: Speech Pathology

## 2023-07-21 ENCOUNTER — Encounter: Payer: Medicare Other | Admitting: Speech Pathology

## 2023-07-23 ENCOUNTER — Encounter: Payer: Medicare Other | Admitting: Speech Pathology

## 2023-07-28 ENCOUNTER — Encounter: Payer: Medicare Other | Admitting: Speech Pathology

## 2023-08-04 ENCOUNTER — Encounter: Payer: Medicare Other | Admitting: Speech Pathology

## 2023-08-06 ENCOUNTER — Encounter: Payer: Medicare Other | Admitting: Speech Pathology

## 2023-08-11 ENCOUNTER — Encounter: Payer: Medicare Other | Admitting: Speech Pathology

## 2023-08-12 ENCOUNTER — Ambulatory Visit: Payer: Medicare Other | Admitting: Pulmonary Disease

## 2023-08-12 ENCOUNTER — Encounter: Payer: Self-pay | Admitting: Pulmonary Disease

## 2023-08-12 VITALS — BP 126/82 | HR 84 | Temp 97.1°F | Ht 67.0 in | Wt 192.8 lb

## 2023-08-12 DIAGNOSIS — J45991 Cough variant asthma: Secondary | ICD-10-CM | POA: Diagnosis not present

## 2023-08-12 DIAGNOSIS — K219 Gastro-esophageal reflux disease without esophagitis: Secondary | ICD-10-CM

## 2023-08-12 DIAGNOSIS — R0602 Shortness of breath: Secondary | ICD-10-CM

## 2023-08-12 DIAGNOSIS — E042 Nontoxic multinodular goiter: Secondary | ICD-10-CM

## 2023-08-12 DIAGNOSIS — R053 Chronic cough: Secondary | ICD-10-CM

## 2023-08-12 LAB — NITRIC OXIDE: Nitric Oxide: 16

## 2023-08-12 MED ORDER — FLUTICASONE-SALMETEROL 100-50 MCG/ACT IN AEPB: 1.0000 | INHALATION_SPRAY | Freq: Two times a day (BID) | RESPIRATORY_TRACT | 6 refills | Status: DC

## 2023-08-12 MED ORDER — FAMOTIDINE 20 MG PO TABS: 20.0000 mg | ORAL_TABLET | Freq: Every day | ORAL | 1 refills | Status: AC

## 2023-08-12 NOTE — Patient Instructions (Signed)
VISIT SUMMARY:  During today's visit, we discussed your persistent cough, which has worsened since the onset of the COVID-19 pandemic. We also reviewed your history of a large thyroid goiter and your past issues with reflux or heartburn. Additionally, we noted changes in your lower extremities, specifically the reduced swelling in your ankles.  YOUR PLAN:  -COUGH VARIANT ASTHMA: Cough variant asthma is a type of asthma where the primary symptom is a persistent cough. We discussed using an inhaler twice daily to help reduce your coughing and improve your quality of life. It's important to rinse your mouth after using the inhaler to avoid oral thrush. We will also conduct breathing tests to confirm the diagnosis and monitor your response to the treatment.  -GASTROESOPHAGEAL REFLUX DISEASE (GERD): GERD is a condition where stomach acid frequently flows back into the tube connecting your mouth and stomach, which can trigger a cough. Although you are not currently experiencing heartburn, we discussed taking famotidine at bedtime to address any potential reflux-related cough. This medication is compatible with your current medications, atorvastatin and trazodone.  -GOITER: A goiter is an abnormal enlargement of your thyroid gland, which can cause tracheal compression. This is often due to iodine deficiency or other nutritional factors. Currently, there are no symptoms that need to be addressed.  INSTRUCTIONS:  Please schedule the breathing tests at the main hospital. We will follow up in 6-8 weeks after the breathing tests to review the results and adjust your treatment plan as needed.

## 2023-08-12 NOTE — Progress Notes (Signed)
Subjective:    Patient ID: Lauren Johnston, female    DOB: Feb 11, 1951, 72 y.o.   MRN: [id number]  Patient Care Team: Alba Cory, MD as PCP - General (Family Medicine) Tedd Sias Marlana Salvage, MD as Physician Assistant (Endocrinology) Lyndle Herrlich, MD as Consulting Physician (Orthopedic Surgery)  Chief Complaint  Patient presents with   Follow-up    Had Covid in 2020. Thyroidectomy 06/18/2020. Cough with clear thick sputum. No SOB or wheezing.      BACKGROUND: Patient is a 72 year old lifelong never smoker is here for issues of excessive cough and phlegm production. 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 She underwent thyroidectomy on 18 June 2020. Since then she has noted that her phlegm production has decreased significantly. She continues to have some issues mostly at nighttime when she lays down related to postnasal drip.  She also notices reflux symptoms.  I have not seen the patient since 20 September 2020 as she never returned for follow-up as instructed.  HPI Discussed the use of AI scribe software for clinical note transcription with the patient, who gave verbal consent to proceed.  History of Present Illness   The patient presents with a persistent cough that has worsened since the onset of the COVID-19 pandemic. She has a history of a large thyroid goiter, which was previously thought to be contributing to the cough. However, the cough persisted even after thyroidectomy. The patient describes the cough as productive, with clear, thick phlegm, but denies any hemoptysis. She also denies any associated fever, chills, or wheezing.  The patient reports no history of asthma. She also denies any current issues with reflux or heartburn, although she has a past history of these symptoms. The patient has been experiencing sleep disturbances due to the cough and the production of phlegm.  She had sleep study in May 2023 that showed no evidence of obstructive sleep apnea.  The patient  also mentions some changes in her lower extremities, noting that her ankles, which are typically large, appear less swollen than usual. She is currently on multiple medications, but does not believe any of these are contributing to her cough.  Previously she had been recommended to take Zyrtec at nighttime due to postnasal drip.  She discontinued taking this on her own.  Cannot recall whether this helped her.    Review of Systems A 10 point review of systems was performed and it is as noted above otherwise negative.   Past Medical History:  Diagnosis Date   Arthritis    hips   Chronic kidney disease    STAGE 3   Dental crowns present    dental implants - upper   Depression    GERD (gastroesophageal reflux disease)    Hyperlipidemia    Hypothyroidism    Thyroid disease     Past Surgical History:  Procedure Laterality Date   ABDOMINAL HYSTERECTOMY     BILATERAL OOPHORECTOMY     COLONOSCOPY WITH PROPOFOL N/A 06/02/2016   Procedure: COLONOSCOPY WITH PROPOFOL;  Surgeon: Midge Minium, MD;  Location: [facility];  Service: Endoscopy;  Laterality: N/A;   COLONOSCOPY WITH PROPOFOL N/A 06/21/2021   Procedure: COLONOSCOPY WITH PROPOFOL;  Surgeon: Midge Minium, MD;  Location: [facility];  Service: Endoscopy;  Laterality: N/A;   dequavian tendonitis Left    KNEE ARTHROSCOPY Bilateral    POLYPECTOMY  06/02/2016   Procedure: POLYPECTOMY;  Surgeon: Midge Minium, MD;  Location: [facility];  Service: Endoscopy;;   THYROIDECTOMY N/A  06/18/2020   Procedure: THYROIDECTOMY, total;  Surgeon: Duanne Guess, MD;  Location: [facility];  Service: General;  Laterality: N/A;   TONSILLECTOMY     AGE 72 OR 11    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 10/15/2020   Pure  hypercholesterolemia 10/15/2020   Stage 3a chronic kidney disease (HCC) 10/15/2020   S/P total thyroidectomy 06/18/2020   Class 1 obesity in adult 11/11/2016   Osteopenia 10/09/2016   Rectal polyp    Right wrist tendonitis 05/16/2016   Osteoarthritis of left hip 05/16/2016   Vitamin D deficiency 02/05/2016   Degeneration of intervertebral disc of cervical region 02/05/2016   Insomnia 04/23/2015   Major depression in remission (HCC) 04/23/2015   Chronic pain 04/23/2015   Goiter, nontoxic, 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   Hypothyroidism, 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   Uterine leiomyoma 06/16/2007    Family History  Problem Relation Age of Onset   Diabetes Mother    Heart disease Mother    Emphysema Father    Heart disease Father    COPD Father    Multiple myeloma Brother    Alzheimer's disease Sister    Dementia Sister     Social History   Tobacco Use   Smoking status: Never   Smokeless tobacco: Never  Substance Use Topics   Alcohol use: No    Alcohol/week: 0.0 standard drinks of alcohol    Allergies  Allergen Reactions   Codeine Itching   Sulfamethoxazole-Trimethoprim Other (See Comments) and Rash   Citalopram Itching   Omeprazole Palpitations    Also weakness   Tetracycline Other (See Comments)    Other Reaction: URTICARIA   Tetracyclines & Related Itching    Current Meds  Medication Sig   acetaminophen (TYLENOL) 500 MG tablet Take 500 mg by mouth every 6 (six) hours as needed.   Artificial Tear Solution (SYSTANE CONTACTS) SOLN Apply to eye.   atorvastatin (LIPITOR) 40 MG tablet Take 1 tablet (40 mg total) by mouth at bedtime.   Biotin w/ Vitamins C & E (HAIR SKIN & NAILS GUMMIES PO) Take 2 tablets by mouth every other day.   Cholecalciferol (VITAMIN D) 50 MCG (2000 UT) CAPS Take 1 capsule (2,000 Units total) by mouth daily.   Cyanocobalamin (B-12) 500 MCG SUBL Place 1 tablet under the tongue daily at 12 noon.   diclofenac Sodium (VOLTAREN) 1 % GEL Apply 4 g topically  4 (four) times daily.   DULoxetine (CYMBALTA) 60 MG capsule Take 1 capsule (60 mg total) by mouth every morning.   estradiol (ESTRACE) 0.5 MG tablet TAKE ONE TABLET BY MOUTH EVERY DAY   famotidine (PEPCID) 20 MG tablet Take 1 tablet (20 mg total) by mouth at bedtime.   fluticasone (FLONASE) 50 MCG/ACT nasal spray Place 2 sprays into both nostrils every evening.   fluticasone-salmeterol (ADVAIR DISKUS) 100-50 MCG/ACT AEPB Inhale 1 puff into the lungs 2 (two) times daily.   traZODone (DESYREL) 50 MG tablet TAKE ONE-HALF TO ONE TABLET BY MOUTH EVERY DAY AT BEDTIME AS NEEDED FOR SLEEP    Immunization History  Administered Date(s) Administered   Fluad Quad(high Dose 65+) 05/06/2019, 08/02/2020, 08/06/2021, 08/20/2022   Influenza Split 06/16/2007, 06/19/2008, 09/07/2008, 07/05/2009, 04/25/2010   Influenza, High Dose Seasonal PF 05/14/2017, 06/02/2018, 04/30/2023   Influenza, Seasonal, Injecte, Preservative  Fre 06/25/2011, 05/26/2012, 05/18/2013   Influenza,inj,Quad PF,6+ Mos 06/07/2014, 04/23/2015, 05/07/2016   Influenza-Unspecified 06/07/2014   Moderna SARS-COV2 Booster Vaccination 05/30/2021, 04/30/2022   Moderna Sars-Covid-2 Vaccination 10/15/2019, 11/12/2019, 10/17/2020   Pneumococcal Conjugate-13 07/17/2017   Pneumococcal Polysaccharide-23 02/05/2016   Tdap 08/01/2010, 04/18/2021   Unspecified SARS-COV-2 Vaccination 04/30/2023   Zoster, Live 04/07/2011        Objective:     BP 126/82 (BP Location: Right Arm, Cuff Size: Normal)   Pulse 84   Temp (!) 97.1 F (36.2 C)   Ht 5\' 7"  (1.702 m)   Wt 192 lb 12.8 oz (87.5 kg)   SpO2 100%   BMI 30.20 kg/m   SpO2: 100 % O2 Device: None (Room air)  GENERAL: Obese woman, no acute distress, fully ambulatory.  No conversational dyspnea.  Looks younger than stated age. HEAD: Normocephalic, atraumatic.  EYES: Pupils equal, round, reactive to light.  No scleral icterus.  MOUTH: Oral mucosa moist.  Clear postnasal drip noted. NECK: Supple.  Trachea midline. No JVD.  No adenopathy.  Well-healed thyroidectomy scar. PULMONARY: Good air entry bilaterally.  Lungs clear to auscultation bilaterally. CARDIOVASCULAR: S1 and S2. Regular rate and rhythm.  No rubs, murmurs or gallops. GASTROINTESTINAL: Benign. MUSCULOSKELETAL: No joint deformity, no clubbing, no edema.  NEUROLOGIC: No focal deficits, no gait disturbance.  Speech is fluent.  Appears forgetful at times. SKIN: Intact,warm,dry.  Limited exam, no rashes PSYCH: Mood and behavior appropriate.  Lab Results  Component Value Date   NITRICOXIDE 16 08/12/2023   Assessment & Plan:     ICD-10-CM   1. Chronic cough  R05.3 Nitric oxide    Pulmonary Function Test [facility] Only    2. Cough variant asthma  J45.991 Pulmonary Function Test [facility] Only    3. LPRD (laryngopharyngeal reflux disease)  K21.9     4. Shortness of breath  R06.02     5. Goiter, nontoxic, multinodular  E04.2    S/P resection      Orders Placed This Encounter  Procedures   Nitric oxide   Pulmonary Function Test [facility] Only    Standing Status:   Future    Standing Expiration Date:   08/11/2024    Order Specific Question:   Full PFT: includes the following: basic spirometry, spirometry pre & post bronchodilator, diffusion capacity (DLCO), lung volumes    Answer:   Full PFT    Order Specific Question:   This test can only be performed at    Answer:   [facility]    Meds ordered this encounter  Medications   fluticasone-salmeterol (ADVAIR DISKUS) 100-50 MCG/ACT AEPB    Sig: Inhale 1 puff into the lungs 2 (two) times daily.    Dispense:  60 each    Refill:  6   famotidine (PEPCID) 20 MG tablet    Sig: Take 1 tablet (20 mg total) by mouth at bedtime.    Dispense:  30 tablet    Refill:  1   Discussion:    Query Cough Variant Asthma Chronic cough exacerbated by COVID-19. No childhood asthma. Clear, thick phlegm without hemoptysis. No wheezing or fever. Possible cough variant asthma, where coughing is  the primary symptom. Discussed inhaler use and proper technique to avoid oral thrush. Inhaler expected to reduce coughing and improve quality of life. Follow-up breathing tests needed to confirm diagnosis and monitor treatment response. - Prescribe inhaler twice daily (Advair 100/50, 1 inhalation twice a day) - Educate on inhaler use and mouth rinsing post-use -  Order PFTs at [facility] - Follow-up in 6-8 weeks post-breathing tests  Gastroesophageal Reflux Disease (GERD) Heartburn with improvement. No current heartburn symptoms. GERD can trigger cough.  Query chronic silent reflux discussed famotidine at bedtime for potential reflux-related cough. Confirmed compatibility with atorvastatin and trazodone. - Prescribe famotidine at bedtime - Confirm compatibility with atorvastatin and trazodone  Goiter Large thyroid was causing tracheal compression. Likely due to iodine deficiency or other nutritional factors. No current symptoms discussed.  Patient is status post thyroidectomy.  Follow-up - Schedule PFTs - Follow-up in 6-8 weeks post-PFTs.     Advised if symptoms do not improve or worsen, to please contact office for sooner follow up or seek emergency care.    I spent 60 minutes of dedicated to the care of this patient on the date of this encounter to include pre-visit review of records, face-to-face time with the patient discussing conditions above, post visit ordering of testing, clinical documentation with the electronic health record, making appropriate referrals as documented, and communicating necessary findings to members of the patients care team.   C. Danice Goltz, MD Advanced Bronchoscopy PCCM [facility]    *This note was dictated using voice recognition software/Dragon.  Despite best efforts to proofread, errors can occur which can change the meaning. Any transcriptional errors that result from this process are unintentional and may not be fully corrected at the  time of dictation.

## 2023-08-13 ENCOUNTER — Encounter: Payer: Medicare Other | Admitting: Speech Pathology

## 2023-08-17 DIAGNOSIS — K219 Gastro-esophageal reflux disease without esophagitis: Secondary | ICD-10-CM | POA: Diagnosis not present

## 2023-08-17 DIAGNOSIS — R053 Chronic cough: Secondary | ICD-10-CM | POA: Diagnosis not present

## 2023-08-18 ENCOUNTER — Encounter: Payer: Medicare Other | Admitting: Speech Pathology

## 2023-08-20 ENCOUNTER — Encounter: Payer: Medicare Other | Admitting: Speech Pathology

## 2023-08-24 DIAGNOSIS — F3342 Major depressive disorder, recurrent, in full remission: Secondary | ICD-10-CM | POA: Diagnosis not present

## 2023-09-14 ENCOUNTER — Other Ambulatory Visit: Payer: Self-pay | Admitting: Family Medicine

## 2023-09-14 ENCOUNTER — Ambulatory Visit: Payer: Medicare Other

## 2023-09-14 DIAGNOSIS — R053 Chronic cough: Secondary | ICD-10-CM

## 2023-09-14 DIAGNOSIS — E7800 Pure hypercholesterolemia, unspecified: Secondary | ICD-10-CM

## 2023-09-14 DIAGNOSIS — Z780 Asymptomatic menopausal state: Secondary | ICD-10-CM

## 2023-09-14 DIAGNOSIS — F331 Major depressive disorder, recurrent, moderate: Secondary | ICD-10-CM

## 2023-09-14 NOTE — Telephone Encounter (Signed)
 Medication Refill -  Most Recent Primary Care Visit:  Provider: GLENARD MIRE  Department: [facility]  Visit Type: OFFICE VISIT  Date: 06/30/2023  Medication: benzonatate  (TESSALON ) 200 MG capsule  atorvastatin  (LIPITOR) 40 MG tablet  estradiol  (ESTRACE ) 0.5 MG tablet  levothyroxine  (SYNTHROID ) 100 MCG tablet  DULoxetine  (CYMBALTA ) 60 MG capsule   Has the patient contacted their pharmacy? No   Is this the correct pharmacy for this prescription? Yes  This is the patient's preferred pharmacy:  [id number] GLENWOOD JACOBS [facility] - [address] [place] [address] Phone: [phone] Fax: [phone]   Has the prescription been filled recently? Yes  Is the patient out of the medication? Yes  Has the patient been seen for an appointment in the last year OR does the patient have an upcoming appointment? Yes  Can we respond through MyChart? No  Agent: Please be advised that Rx refills may take up to 3 business days. We ask that you follow-up with your pharmacy. [facility].

## 2023-09-15 MED ORDER — DULOXETINE HCL 60 MG PO CPEP: 60.0000 mg | ORAL_CAPSULE | ORAL | 0 refills | Status: DC

## 2023-09-15 MED ORDER — LEVOTHYROXINE SODIUM 100 MCG PO TABS: 100.0000 ug | ORAL_TABLET | Freq: Every morning | ORAL | 5 refills | Status: AC

## 2023-09-15 MED ORDER — BENZONATATE 200 MG PO CAPS: 200.0000 mg | ORAL_CAPSULE | Freq: Two times a day (BID) | ORAL | 0 refills | Status: DC | PRN

## 2023-09-15 MED ORDER — ATORVASTATIN CALCIUM 40 MG PO TABS: 40.0000 mg | ORAL_TABLET | Freq: Every day | ORAL | 0 refills | Status: DC

## 2023-09-15 MED ORDER — ESTRADIOL 0.5 MG PO TABS: 0.5000 mg | ORAL_TABLET | Freq: Every day | ORAL | 0 refills | Status: DC

## 2023-09-15 NOTE — Telephone Encounter (Signed)
 Requested Prescriptions  Pending Prescriptions Disp Refills   DULoxetine  (CYMBALTA ) 60 MG capsule 90 capsule 0    Sig: Take 1 capsule (60 mg total) by mouth every morning.     Psychiatry: Antidepressants - SNRI - duloxetine  Failed - 09/15/2023  3:42 PM      Failed - Cr in normal range and within 360 days    Creat  Date Value Ref Range Status  06/30/2023 1.07 (H) 0.60 - 1.00 mg/dL Final         Passed - eGFR is 30 or above and within 360 days    GFR, Est African American  Date Value Ref Range Status  10/15/2020 71 > OR = 60 mL/min/1.33m2 Final   GFR, Est Non African American  Date Value Ref Range Status  10/15/2020 61 > OR = 60 mL/min/1.54m2 Final   eGFR  Date Value Ref Range Status  06/30/2023 55 (L) > OR = 60 mL/min/1.68m2 Final         Passed - Completed PHQ-2 or PHQ-9 in the last 360 days      Passed - Last BP in normal range    BP Readings from Last 1 Encounters:  08/12/23 126/82         Passed - Valid encounter within last 6 months    Recent Outpatient Visits           2 months ago Moderate episode of recurrent major depressive disorder [facility])   [place] [facility], Dorette, MD   8 months ago Moderate episode of recurrent major depressive disorder [facility])   [place] [facility] Glenard Dorette, MD   11 months ago Upper respiratory tract infection, unspecified type   [facility] Mecum, Rocky BRAVO, PA-C   1 year ago Well adult exam   [facility] Glenard Dorette, MD   1 year ago Stage 3a chronic kidney disease [facility])   [place] [facility] Glenard Dorette, MD       Future Appointments             In 3 months Sowles, Krichna, MD [facility]             benzonatate  (TESSALON ) 200 MG capsule 180 capsule 1    Sig: Take 1 capsule (200 mg total) by mouth 2 (two) times daily as needed for cough.     Ear, Nose, and Throat:   Antitussives/Expectorants Passed - 09/15/2023  3:42 PM      Passed - Valid encounter within last 12 months    Recent Outpatient Visits           2 months ago Moderate episode of recurrent major depressive disorder [facility])   [place] [facility] [place], Dorette, MD   8 months ago Moderate episode of recurrent major depressive disorder [facility])   [place] [facility] Glenard Dorette, MD   11 months ago Upper respiratory tract infection, unspecified type   [facility] Mecum, Rocky BRAVO, PA-C   1 year ago Well adult exam   [facility] Glenard Dorette, MD   1 year ago Stage 3a chronic kidney disease [facility])   [facility] Glenard Dorette, MD       Future Appointments             In 3 months Glenard, Krichna, MD [facility]  atorvastatin  (LIPITOR) 40 MG tablet 90 tablet 0    Sig: Take 1 tablet (40 mg total) by mouth at bedtime.     Cardiovascular:  Antilipid - Statins Failed - 09/15/2023  3:42 PM      Failed - Lipid Panel in normal range within the last 12 months    Cholesterol, Total  Date Value Ref Range Status  03/06/2016 161 100 - 199 mg/dL Final   Cholesterol  Date Value Ref Range Status  12/24/2022 143 <200 mg/dL Final   LDL Cholesterol (Calc)  Date Value Ref Range Status  12/24/2022 81 mg/dL (calc) Final    Comment:    Reference range: <100 . Desirable range <100 mg/dL for primary prevention;   <70 mg/dL for patients with CHD or diabetic patients  with > or = 2 CHD risk factors. SABRA LDL-C is now calculated using the Martin-Hopkins  calculation, which is a validated novel method providing  better accuracy than the Friedewald equation in the  estimation of LDL-C.  Gladis APPLETHWAITE et al. SANDREA. 7986;689(80): 2061-2068  (http://education.[employer].com/faq/FAQ164)    HDL  Date Value Ref Range Status  12/24/2022 48  (L) > OR = 50 mg/dL Final  92/93/7982 55 >60 mg/dL Final   Triglycerides  Date Value Ref Range Status  12/24/2022 67 <150 mg/dL Final         Passed - Patient is not pregnant      Passed - Valid encounter within last 12 months    Recent Outpatient Visits           2 months ago Moderate episode of recurrent major depressive disorder [facility])   [place] [facility] [place], Dorette, MD   8 months ago Moderate episode of recurrent major depressive disorder [facility])   [place] [facility] Glenard Dorette, MD   11 months ago Upper respiratory tract infection, unspecified type   [facility] Mecum, Rocky BRAVO, PA-C   1 year ago Well adult exam   [place] [facility] Glenard Dorette, MD   1 year ago Stage 3a chronic kidney disease [facility])   [place] [facility] Glenard Dorette, MD       Future Appointments             In 3 months Sowles, Krichna, MD [facility]             estradiol  (ESTRACE ) 0.5 MG tablet 90 tablet 0    Sig: Take 1 tablet (0.5 mg total) by mouth daily.     OB/GYN:  Estrogens Passed - 09/15/2023  3:42 PM      Passed - Mammogram is up-to-date per Health Maintenance      Passed - Last BP in normal range    BP Readings from Last 1 Encounters:  08/12/23 126/82         Passed - Valid encounter within last 12 months    Recent Outpatient Visits           2 months ago Moderate episode of recurrent major depressive disorder [facility])   [place] [facility], Dorette, MD   8 months ago Moderate episode of recurrent major depressive disorder [facility])   [facility] Glenard Dorette, MD   11 months ago Upper respiratory tract infection, unspecified type   [facility] Mecum, Rocky BRAVO, PA-C   1 year ago Well adult exam   [facility] Glenard Mire, MD    1 year ago Stage 3a chronic kidney disease [facility])   [facility] Glenard Mire, MD       Future Appointments             In 3 months Sowles, Krichna, MD [facility]             levothyroxine  (SYNTHROID ) 100 MCG tablet 30 tablet 11    Sig: Take 1 tablet (100 mcg total) by mouth in the morning. Take 30-60 minutes before breakfast     Endocrinology:  Hypothyroid Agents Passed - 09/15/2023  3:42 PM      Passed - TSH in normal range and within 360 days    TSH  Date Value Ref Range Status  06/30/2023 0.42 0.40 - 4.50 mIU/L Final         Passed - Valid encounter within last 12 months    Recent Outpatient Visits           2 months ago Moderate episode of recurrent major depressive disorder [facility])   [place] [facility] [place], Mire, MD   8 months ago Moderate episode of recurrent major depressive disorder [facility])   [place] [facility] Glenard Mire, MD   11 months ago Upper respiratory tract infection, unspecified type   [facility] Mecum, Rocky BRAVO, PA-C   1 year ago Well adult exam   [facility] Glenard Mire, MD   1 year ago Stage 3a chronic kidney disease [facility])   [place] [facility] Glenard Mire, MD       Future Appointments             In 3 months Glenard, Krichna, MD [facility]

## 2023-09-15 NOTE — Telephone Encounter (Signed)
 Requested medications are due for refill today.  unsure  Requested medications are on the active medications list.  yes  Last refill. Levothyroxine  08/01/2021 Tessalon  02/17/2023  Future visit scheduled.   yes  Notes to clinic.  Levothyroxine  is historical. Tessalon  is not usually a long term medication.    Requested Prescriptions  Pending Prescriptions Disp Refills   benzonatate  (TESSALON ) 200 MG capsule 180 capsule 1    Sig: Take 1 capsule (200 mg total) by mouth 2 (two) times daily as needed for cough.     Ear, Nose, and Throat:  Antitussives/Expectorants Passed - 09/15/2023  3:43 PM      Passed - Valid encounter within last 12 months    Recent Outpatient Visits           2 months ago Moderate episode of recurrent major depressive disorder [facility])   [facility] [place], Doret[facility], MD   8 months ago Moderate episode of recurrent major depressive disorder [facility])   [place] [facility] Glenard Dorette, MD   11 months ago Upper respiratory tract infection, unspecified type   [facility] Mecum, Rocky BRAVO, PA-C   1 year ago Well adult exam   [facility] Glenard Dorette, MD   1 year ago Stage 3a chronic kidney disease [facility])   [facility] Glenard Dorette, MD       Future Appointments             In 3 months Sowles, Krichna, MD [facility]             levothyroxine  (SYNTHROID ) 100 MCG tablet 30 tablet 11    Sig: Take 1 tablet (100 mcg total) by mouth in the morning. Take 30-60 minutes before breakfast     Endocrinology:  Hypothyroid Agents Passed - 09/15/2023  3:43 PM      Passed - TSH in normal range and within 360 days    TSH  Date Value Ref Range Status  06/30/2023 0.42 0.40 - 4.50 mIU/L Final         Passed - Valid encounter within last 12 months    Recent Outpatient Visits           2 months ago Moderate episode of  recurrent major depressive disorder [facility])   [place] [facility] [place], Dorette, MD   8 months ago Moderate episode of recurrent major depressive disorder [facility])   [place] [facility] Glenard Dorette, MD   11 months ago Upper respiratory tract infection, unspecified type   [facility] Mecum, Rocky BRAVO, PA-C   1 year ago Well adult exam   [facility] Glenard Dorette, MD   1 year ago Stage 3a chronic kidney disease [facility])   [place] [facility] Glenard Dorette, MD       Future Appointments             In 3 months Sowles, Krichna, MD [facility]            Signed Prescriptions Disp Refills   DULoxetine  (CYMBALTA ) 60 MG capsule 90 capsule 0    Sig: Take 1 capsule (60 mg total) by mouth every morning.     Psychiatry: Antidepressants - SNRI - duloxetine  Failed - 09/15/2023  3:43 PM      Failed - Cr in normal range and within 360 days  Creat  Date Value Ref Range Status  06/30/2023 1.07 (H) 0.60 - 1.00 mg/dL Final         Passed - eGFR is 30 or above and within 360 days    GFR, Est African American  Date Value Ref Range Status  10/15/2020 71 > OR = 60 mL/min/1.82m2 Final   GFR, Est Non African American  Date Value Ref Range Status  10/15/2020 61 > OR = 60 mL/min/1.33m2 Final   eGFR  Date Value Ref Range Status  06/30/2023 55 (L) > OR = 60 mL/min/1.66m2 Final         Passed - Completed PHQ-2 or PHQ-9 in the last 360 days      Passed - Last BP in normal range    BP Readings from Last 1 Encounters:  08/12/23 126/82         Passed - Valid encounter within last 6 months    Recent Outpatient Visits           2 months ago Moderate episode of recurrent major depressive disorder [facility])   [place] [facility] [place], Dorette, MD   8 months ago Moderate episode of recurrent major depressive disorder [facility])   [place]  [facility] Glenard Dorette, MD   11 months ago Upper respiratory tract infection, unspecified type   [facility] Mecum, Rocky BRAVO, PA-C   1 year ago Well adult exam   [place] Glenard Dorette, MD   1 year ago Stage 3a chronic kidney disease [facility])   [facility] Glenard Dorette, MD       Future Appointments             In 3 months Sowles, Krichna, MD [facility]             atorvastatin  (LIPITOR) 40 MG tablet 90 tablet 0    Sig: Take 1 tablet (40 mg total) by mouth at bedtime.     Cardiovascular:  Antilipid - Statins Failed - 09/15/2023  3:43 PM      Failed - Lipid Panel in normal range within the last 12 months    Cholesterol, Total  Date Value Ref Range Status  03/06/2016 161 100 - 199 mg/dL Final   Cholesterol  Date Value Ref Range Status  12/24/2022 143 <200 mg/dL Final   LDL Cholesterol (Calc)  Date Value Ref Range Status  12/24/2022 81 mg/dL (calc) Final    Comment:    Reference range: <100 . Desirable range <100 mg/dL for primary prevention;   <70 mg/dL for patients with CHD or diabetic patients  with > or = 2 CHD risk factors. SABRA LDL-C is now calculated using the Martin-Hopkins  calculation, which is a validated novel method providing  better accuracy than the Friedewald equation in the  estimation of LDL-C.  Gladis APPLETHWAITE et al. SANDREA. 7986;689(80): 2061-2068  (http://education.[facility].com/faq/FAQ164)    HDL  Date Value Ref Range Status  12/24/2022 48 (L) > OR = 50 mg/dL Final  92/93/7982 55 >60 mg/dL Final   Triglycerides  Date Value Ref Range Status  12/24/2022 67 <150 mg/dL Final         Passed - Patient is not pregnant      Passed - Valid encounter within last 12 months    Recent Outpatient Visits           2 months ago Moderate episode of recurrent major depressive disorder (  [facility])   [place] [facility] [place], Dorette, MD   8 months ago Moderate episode of recurrent major depressive disorder [facility])   [facility] [place], Krichna, MD   11 months ago Upper respiratory tract infection, unspecified type   [facility] Mecum, Rocky BRAVO, PA-C   1 year ago Well adult exam   [place] [facility] Glenard Dorette, MD   1 year ago Stage 3a chronic kidney disease [facility])   [facility] Glenard Dorette, MD       Future Appointments             In 3 months Sowles, Krichna, MD [facility]             estradiol  (ESTRACE ) 0.5 MG tablet 90 tablet 0    Sig: Take 1 tablet (0.5 mg total) by mouth daily.     OB/GYN:  Estrogens Passed - 09/15/2023  3:43 PM      Passed - Mammogram is up-to-date per Health Maintenance      Passed - Last BP in normal range    BP Readings from Last 1 Encounters:  08/12/23 126/82         Passed - Valid encounter within last 12 months    Recent Outpatient Visits           2 months ago Moderate episode of recurrent major depressive disorder [facility])   [place] Glen[facility] Dorette, MD   8 months ago Moderate episode of recurrent major depressive disorder [facility])   [place] [facility] Glen[facility] Dorette, MD   11 months ago Upper respiratory tract infection, unspecified type   [facility] Mecum, Rocky BRAVO, PA-C   1 year ago Well adult exam   [facility] Glenard Dorette, MD   1 year ago Stage 3a chronic kidney disease [facility])   [facility] Glenard Dorette, MD       Future Appointments             In 3 months Glenard, Krichna, MD [facility]

## 2023-09-15 NOTE — Telephone Encounter (Signed)
 Historical provider.

## 2023-09-24 ENCOUNTER — Ambulatory Visit: Payer: Medicare Other

## 2023-09-24 ENCOUNTER — Ambulatory Visit: Payer: Medicare Other | Admitting: Pulmonary Disease

## 2023-09-25 ENCOUNTER — Ambulatory Visit: Payer: Medicare Other | Attending: Pulmonary Disease

## 2023-09-25 DIAGNOSIS — J45991 Cough variant asthma: Secondary | ICD-10-CM | POA: Diagnosis not present

## 2023-09-25 DIAGNOSIS — R053 Chronic cough: Secondary | ICD-10-CM | POA: Diagnosis not present

## 2023-09-25 LAB — PULMONARY FUNCTION TEST ARMC ONLY
DL/VA % pred: 103 %
DL/VA: 4.17 ml/min/mmHg/L
DLCO unc % pred: 63 %
DLCO unc: 13.58 ml/min/mmHg
FEF 25-75 Post: 3.61 L/s
FEF 25-75 Pre: 2.29 L/s
FEF2575-%Change-Post: 57 %
FEF2575-%Pred-Post: 186 %
FEF2575-%Pred-Pre: 118 %
FEV1-%Change-Post: 11 %
FEV1-%Pred-Post: 83 %
FEV1-%Pred-Pre: 75 %
FEV1-Post: 2.06 L
FEV1-Pre: 1.85 L
FEV1FVC-%Change-Post: 4 %
FEV1FVC-%Pred-Pre: 113 %
FEV6-%Change-Post: 6 %
FEV6-%Pred-Post: 73 %
FEV6-%Pred-Pre: 69 %
FEV6-Post: 2.31 L
FEV6-Pre: 2.17 L
FEV6FVC-%Pred-Post: 104 %
FEV6FVC-%Pred-Pre: 104 %
FVC-%Change-Post: 6 %
FVC-%Pred-Post: 70 %
FVC-%Pred-Pre: 66 %
FVC-Post: 2.31 L
FVC-Pre: 2.17 L
Post FEV1/FVC ratio: 89 %
Post FEV6/FVC ratio: 100 %
Pre FEV1/FVC ratio: 85 %
Pre FEV6/FVC Ratio: 100 %
RV % pred: 80 %
RV: 1.94 L
TLC % pred: 78 %
TLC: 4.33 L

## 2023-09-25 MED ORDER — ALBUTEROL SULFATE (2.5 MG/3ML) 0.083% IN NEBU
2.5000 mg | INHALATION_SOLUTION | Freq: Once | RESPIRATORY_TRACT | Status: AC
Start: 2023-09-25 — End: 2023-09-25
  Administered 2023-09-25: 2.5 mg via RESPIRATORY_TRACT
  Filled 2023-09-25: qty 3

## 2023-09-29 ENCOUNTER — Ambulatory Visit: Payer: Medicare Other | Admitting: Pulmonary Disease

## 2023-09-29 DIAGNOSIS — R0683 Snoring: Secondary | ICD-10-CM | POA: Diagnosis not present

## 2023-09-29 DIAGNOSIS — Z9189 Other specified personal risk factors, not elsewhere classified: Secondary | ICD-10-CM | POA: Diagnosis not present

## 2023-09-29 DIAGNOSIS — G4730 Sleep apnea, unspecified: Secondary | ICD-10-CM | POA: Diagnosis not present

## 2023-09-29 DIAGNOSIS — G3184 Mild cognitive impairment, so stated: Secondary | ICD-10-CM | POA: Diagnosis not present

## 2023-10-01 ENCOUNTER — Encounter: Payer: Self-pay | Admitting: Pulmonary Disease

## 2023-10-01 ENCOUNTER — Ambulatory Visit (INDEPENDENT_AMBULATORY_CARE_PROVIDER_SITE_OTHER): Payer: Medicare Other | Admitting: Pulmonary Disease

## 2023-10-01 VITALS — BP 124/84 | HR 72 | Temp 96.9°F | Ht 67.0 in | Wt 194.6 lb

## 2023-10-01 DIAGNOSIS — K219 Gastro-esophageal reflux disease without esophagitis: Secondary | ICD-10-CM | POA: Diagnosis not present

## 2023-10-01 DIAGNOSIS — R053 Chronic cough: Secondary | ICD-10-CM | POA: Diagnosis not present

## 2023-10-01 DIAGNOSIS — J479 Bronchiectasis, uncomplicated: Secondary | ICD-10-CM

## 2023-10-01 DIAGNOSIS — J45991 Cough variant asthma: Secondary | ICD-10-CM

## 2023-10-01 MED ORDER — FLUTICASONE-SALMETEROL 250-50 MCG/ACT IN AEPB: 1.0000 | INHALATION_SPRAY | Freq: Two times a day (BID) | RESPIRATORY_TRACT | 11 refills | Status: AC

## 2023-10-01 NOTE — Patient Instructions (Signed)
VISIT SUMMARY:  During today's visit, we discussed your chronic cough, asthma, and concerns about memory loss related to your medications. We reviewed your current treatments and made some adjustments to better manage your symptoms.  YOUR PLAN:  -CHRONIC COUGH: A chronic cough is a cough that lasts for a long time and can produce mucus. Your cough is likely related to asthma. We will increase your Advair dosage to help control your symptoms better. Additionally, we will order a chest CT scan to further investigate, and you will start taking Zyrtec at bedtime to help with nighttime cough.  -ASTHMA: Asthma is a condition where your airways become inflamed and narrow, making it hard to breathe. Your current treatment with Advair has shown some improvement, so we will increase the dosage to help control your symptoms better.  -DEPRESSION: Depression is a mood disorder that causes persistent feelings of sadness and loss of interest. You are concerned about memory loss potentially related to your antidepressant medication. We recommend discussing with your primary care physician about safely tapering off your antidepressant to see if it helps with your memory concerns.  INSTRUCTIONS:  Please follow up in 6-8 weeks to review your progress. Make sure to discuss tapering off your antidepressant with your primary care physician. We will also schedule a chest CT scan to further investigate your chronic cough.

## 2023-10-01 NOTE — Progress Notes (Unsigned)
Subjective:    Patient ID: Lauren Johnston, female    DOB: 20-Feb-1951, 73 y.o.   MRN: [id number]  Patient Care Team: Alba Cory, MD as PCP - General (Family Medicine) Tedd Sias Marlana Salvage, MD as Physician Assistant (Endocrinology) Lyndle Herrlich, MD as Consulting Physician (Orthopedic Surgery)  Chief Complaint  Patient presents with   Follow-up    Cough with white sputum. No wheezing or SOB.    BACKGROUND/INTERVAL:  HPI    Review of Systems A 10 point review of systems was performed and it is as noted above otherwise negative.   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   Class 1 obesity in adult 11/11/2016   Osteopenia 10/09/2016   Rectal polyp    Right wrist tendonitis 05/16/2016   Osteoarthritis of left hip 05/16/2016   Vitamin D deficiency 02/05/2016   Degeneration of intervertebral disc of cervical region 02/05/2016   Insomnia 04/23/2015   Major depression in remission (HCC) 04/23/2015   Chronic pain 04/23/2015   Goiter, nontoxic, 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   Hypothyroidism, 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   Uterine leiomyoma 06/16/2007    Social History   Tobacco Use   Smoking status: Never   Smokeless tobacco: Never  Substance Use Topics   Alcohol use: No    Alcohol/week: 0.0 standard drinks of alcohol    Allergies  Allergen Reactions   Codeine Itching   Sulfamethoxazole-Trimethoprim Other (See Comments) and Rash   Citalopram Itching   Omeprazole Palpitations    Also weakness   Tetracycline Other (See Comments)    Other Reaction: URTICARIA   Tetracyclines & Related Itching    Current Meds  Medication Sig    acetaminophen (TYLENOL) 500 MG tablet Take 500 mg by mouth every 6 (six) hours as needed.   Artificial Tear Solution (SYSTANE CONTACTS) SOLN Apply to eye.   atorvastatin (LIPITOR) 40 MG tablet Take 1 tablet (40 mg total) by mouth at bedtime.   benzonatate (TESSALON) 200 MG capsule Take 1 capsule (200 mg total) by mouth 2 (two) times daily as needed for cough.   Biotin w/ Vitamins C & E (HAIR SKIN & NAILS GUMMIES PO) Take 2 tablets by mouth every other day.   Cholecalciferol (VITAMIN D) 50 MCG (2000 UT) CAPS Take 1 capsule (2,000 Units total) by mouth daily.   Cyanocobalamin (B-12) 500 MCG SUBL Place 1 tablet under the tongue daily at 12 noon.   diclofenac Sodium (VOLTAREN) 1 % GEL Apply 4 g topically 4 (four) times daily.   DULoxetine (CYMBALTA) 60 MG capsule Take 1 capsule (60 mg total) by mouth every morning.   estradiol (ESTRACE) 0.5 MG tablet Take 1 tablet (0.5 mg total) by mouth daily.   famotidine (PEPCID) 20 MG tablet Take 1 tablet (20 mg total) by mouth at bedtime.   fluticasone (FLONASE) 50 MCG/ACT nasal spray Place 2 sprays into both nostrils every evening.   fluticasone-salmeterol (ADVAIR DISKUS) 100-50 MCG/ACT AEPB Inhale 1 puff into the lungs 2 (two) times daily.   levothyroxine (SYNTHROID) 100 MCG tablet Take 1 tablet (100 mcg total) by mouth in the morning. Take 30-60 minutes before breakfast   traZODone (DESYREL) 50 MG  tablet TAKE ONE-HALF TO ONE TABLET BY MOUTH EVERY DAY AT BEDTIME AS NEEDED FOR SLEEP    Immunization History  Administered Date(s) Administered   Fluad Quad(high Dose 65+) 05/06/2019, 08/02/2020, 08/06/2021, 08/20/2022   Influenza Split 06/16/2007, 06/19/2008, 09/07/2008, 07/05/2009, 04/25/2010   Influenza, High Dose Seasonal PF 05/14/2017, 06/02/2018, 04/30/2023   Influenza, Seasonal, Injecte, Preservative Fre 06/25/2011, 05/26/2012, 05/18/2013   Influenza,inj,Quad PF,6+ Mos 06/07/2014, 04/23/2015, 05/07/2016   Influenza-Unspecified 06/07/2014   Moderna  SARS-COV2 Booster Vaccination 05/30/2021, 04/30/2022   Moderna Sars-Covid-2 Vaccination 10/15/2019, 11/12/2019, 10/17/2020   Pneumococcal Conjugate-13 07/17/2017   Pneumococcal Polysaccharide-23 02/05/2016   Tdap 08/01/2010, 04/18/2021   Unspecified SARS-COV-2 Vaccination 04/30/2023   Zoster, Live 04/07/2011        Objective:     BP 124/84 (BP Location: Right Arm, Cuff Size: Normal)   Pulse 72   Temp (!) 96.9 F (36.1 C)   Ht 5\' 7"  (1.702 m)   Wt 194 lb 9.6 oz (88.3 kg)   SpO2 99%   BMI 30.48 kg/m   SpO2: 99 % O2 Device: None (Room air)  GENERAL: Obese woman, no acute distress, fully ambulatory.  No conversational dyspnea.  Looks younger than stated age. HEAD: Normocephalic, atraumatic.  EYES: Pupils equal, round, reactive to light.  No scleral icterus.  MOUTH: Oral mucosa moist.  Clear postnasal drip noted. NECK: Supple. Trachea midline. No JVD.  No adenopathy.  Well-healed thyroidectomy scar. PULMONARY: Good air entry bilaterally.  Lungs clear to auscultation bilaterally. CARDIOVASCULAR: S1 and S2. Regular rate and rhythm.  No rubs, murmurs or gallops. GASTROINTESTINAL: Benign. MUSCULOSKELETAL: No joint deformity, no clubbing, no edema.  NEUROLOGIC: No focal deficits, no gait disturbance.  Speech is fluent.  Appears forgetful at times. SKIN: Intact,warm,dry.  Limited exam, no rashes PSYCH: Mood and behavior appropriate.          Assessment & Plan:   No diagnosis found.  No orders of the defined types were placed in this encounter.   No orders of the defined types were placed in this encounter.      Advised if symptoms do not improve or worsen, to please contact office for sooner follow up or seek emergency care.    I spent xxx minutes of dedicated to the care of this patient on the date of this encounter to include pre-visit review of records, face-to-face time with the patient discussing conditions above, post visit ordering of testing, clinical  documentation with the electronic health record, making appropriate referrals as documented, and communicating necessary findings to members of the patients care team.     C. Danice Goltz, MD Advanced Bronchoscopy PCCM [facility]    *This note was generated using voice recognition software/Dragon and/or AI transcription program.  Despite best efforts to proofread, errors can occur which can change the meaning. Any transcriptional errors that result from this process are unintentional and may not be fully corrected at the time of dictation.

## 2023-10-02 ENCOUNTER — Ambulatory Visit
Admission: RE | Admit: 2023-10-02 | Discharge: 2023-10-02 | Disposition: A | Discharge status: Home / Self Care | Payer: Medicare Other | Attending: Pulmonary Disease | Admitting: Pulmonary Disease

## 2023-10-02 ENCOUNTER — Ambulatory Visit
Admission: RE | Admit: 2023-10-02 | Discharge: 2023-10-02 | Disposition: A | Discharge status: Home / Self Care | Payer: Medicare Other | Source: Ambulatory Visit | Attending: Pulmonary Disease | Admitting: Pulmonary Disease

## 2023-10-02 ENCOUNTER — Telehealth: Payer: Self-pay | Admitting: Pulmonary Disease

## 2023-10-02 DIAGNOSIS — R053 Chronic cough: Secondary | ICD-10-CM | POA: Insufficient documentation

## 2023-10-02 NOTE — Telephone Encounter (Signed)
I have spoke with Lauren Johnston and she is aware the chest xray will need to be done before they will approve the chest CT. Hopefully she will get it done today

## 2023-10-02 NOTE — Telephone Encounter (Signed)
Dr. Jayme Cloud you order chest CT for this patient and it is scheduled on 10/09/23. I was trying to do the PA for the CT and they wouldn't approve because chest xray hasn't been done. Would you be willing to order

## 2023-10-05 ENCOUNTER — Ambulatory Visit: Payer: Self-pay

## 2023-10-05 NOTE — Telephone Encounter (Signed)
     Chief Complaint: Lauren Johnston and injured right shoulder. Hurts to move ot. Symptoms: Above Frequency: 2-3 weeks Pertinent Negatives: Patient denies  Disposition: [] ED /[] Urgent Care (no appt availability in office) / [x] Appointment(In office/virtual)/ []  [facility]/ [] Home Care/ [] Refused Recommended Disposition /[] [facility]/ []  Follow-up with PCP Additional Notes: Agrees with appointment.  Reason for Disposition  MILD weakness (i.e., does not interfere with ability to work, go to school, normal activities)  (Exception: Mild weakness is a chronic symptom.)  Answer Assessment - Initial Assessment Questions 1. MECHANISM: "How did the fall happen?"     Lauren Johnston - carrying groceries 2. DOMESTIC VIOLENCE AND ELDER ABUSE SCREENING: "Did you fall because someone pushed you or tried to hurt you?" If Yes, ask: "Are you safe now?"     No 3. ONSET: "When did the fall happen?" (e.g., minutes, hours, or days ago)     2-3 weeks ago 4. LOCATION: "What part of the body hit the ground?" (e.g., back, buttocks, head, hips, knees, hands, head, stomach)     Right knee 5. INJURY: "Did you hurt (injure) yourself when you fell?" If Yes, ask: "What did you injure? Tell me more about this?" (e.g., body area; type of injury; pain severity)"     Right shoulder 6. PAIN: "Is there any pain?" If Yes, ask: "How bad is the pain?" (e.g., Scale 1-10; or mild,  moderate, severe)   - NONE (0): No pain   - MILD (1-3): Doesn't interfere with normal activities    - MODERATE (4-7): Interferes with normal activities or awakens from sleep    - SEVERE (8-10): Excruciating pain, unable to do any normal activities      6-7 7. SIZE: For cuts, bruises, or swelling, ask: "How large is it?" (e.g., inches or centimeters)      N/a 8. PREGNANCY: "Is there any chance you are pregnant?" "When was your last menstrual period?"     No 9. OTHER SYMPTOMS: "Do you have any other symptoms?" (e.g., dizziness, fever,  weakness; new onset or worsening).      No 10. CAUSE: "What do you think caused the fall (or falling)?" (e.g., tripped, dizzy spell)       Tripped  Protocols used: Falls and [facility]

## 2023-10-06 ENCOUNTER — Encounter: Payer: Self-pay | Admitting: Family Medicine

## 2023-10-06 ENCOUNTER — Ambulatory Visit (INDEPENDENT_AMBULATORY_CARE_PROVIDER_SITE_OTHER): Payer: Medicare Other | Admitting: Family Medicine

## 2023-10-06 VITALS — BP 106/74 | HR 87 | Temp 97.5°F | Resp 16 | Ht 67.0 in | Wt 189.4 lb

## 2023-10-06 DIAGNOSIS — Z9181 History of falling: Secondary | ICD-10-CM

## 2023-10-06 DIAGNOSIS — M170 Bilateral primary osteoarthritis of knee: Secondary | ICD-10-CM

## 2023-10-06 DIAGNOSIS — M25562 Pain in left knee: Secondary | ICD-10-CM

## 2023-10-06 DIAGNOSIS — M7541 Impingement syndrome of right shoulder: Secondary | ICD-10-CM

## 2023-10-06 DIAGNOSIS — H1132 Conjunctival hemorrhage, left eye: Secondary | ICD-10-CM

## 2023-10-06 DIAGNOSIS — N1831 Chronic kidney disease, stage 3a: Secondary | ICD-10-CM

## 2023-10-06 MED ORDER — NAPROXEN 500 MG PO TABS: 500.0000 mg | ORAL_TABLET | Freq: Two times a day (BID) | ORAL | 0 refills | Status: DC

## 2023-10-06 MED ORDER — DICLOFENAC SODIUM 1 % EX GEL: 4.0000 g | Freq: Four times a day (QID) | CUTANEOUS | 0 refills | Status: AC

## 2023-10-06 NOTE — Progress Notes (Signed)
 Name: Lauren Johnston   MRN: [id number]    DOB: 11/11/1950   Date:10/06/2023       Progress Note  Subjective  Chief Complaint  Chief Complaint  Patient presents with   Shoulder Injury    R shoulder hurts to lift arm, had a fall 2 months ago     Discussed the use of AI scribe software for clinical note transcription with the patient, who gave verbal consent to proceed.  History of Present Illness   Lauren Johnston is a 73 year old female who presents with right shoulder pain following a fall two months ago.  She has been experiencing right shoulder pain that began after a fall approximately two months ago. During the fall, she tripped while carrying groceries and landed on her right side, impacting her knee and shoulder. Initially, she focused on her knee due to significant bruising and pain, but over time, she noticed increasing difficulty with shoulder movement. About a week and a half ago, she became aware of soreness in her right shoulder, particularly when performing activities that involve reaching or lifting her arm above her head. The pain is persistent and interferes with daily tasks such as putting dishes away or hanging items. She describes the pain as having 'creeped up' on her and has not improved since the fall. She has tried over-the-counter medications like Tylenol  and uses a heating pad for relief, but these measures have not resolved the issue. No previous problems with her right shoulder prior to the fall. In the review of symptoms, the pain is primarily associated with movement, particularly abduction and reaching overhead, and is not exacerbated by palpation of the shoulder area.  She reports memory issues, which may be related to duloxetine  use. The memory loss could be associated with depression or anxiety. She is under the care of a neurologist for further evaluation.  She has a subconjunctival hemorrhage in the eye, likely due to coughing. It is not affecting vision and is  expected to resolve on its own over weeks to months.        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   Class 1 obesity in adult 11/11/2016   Osteopenia 10/09/2016   Rectal polyp    Right wrist tendonitis 05/16/2016   Osteoarthritis of left hip 05/16/2016   Vitamin D  deficiency 02/05/2016   Degeneration of intervertebral disc of cervical region 02/05/2016   Insomnia 04/23/2015   Major depression in remission (HCC) 04/23/2015   Chronic pain 04/23/2015   Goiter, nontoxic, 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   Hypothyroidism, 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   Uterine leiomyoma 06/16/2007    Social History   Tobacco Use   Smoking status: Never   Smokeless tobacco: Never  Substance Use Topics   Alcohol use: No    Alcohol/week: 0.0 standard drinks of alcohol     Current Outpatient Medications:    acetaminophen  (TYLENOL ) 500 MG tablet, Take 500 mg by mouth every 6 (six) hours as needed., Disp: , Rfl:    Artificial Tear Solution (SYSTANE CONTACTS) SOLN, Apply to eye., Disp: , Rfl:    atorvastatin  (LIPITOR) 40 MG tablet, Take 1 tablet (40 mg total)  by mouth at bedtime., Disp: 90 tablet, Rfl: 0   benzonatate  (TESSALON ) 200 MG capsule, Take 1 capsule (200 mg total) by mouth 2 (two) times daily as needed for cough., Disp: 180 capsule, Rfl: 0   Biotin w/ Vitamins C & E (HAIR SKIN & NAILS GUMMIES PO), Take 2 tablets by mouth every other day., Disp: , Rfl:    Cholecalciferol  (VITAMIN D ) 50 MCG (2000 UT) CAPS, Take 1 capsule (2,000 Units total) by mouth daily., Disp: 100 capsule, Rfl: 1   Cyanocobalamin  (B-12) 500 MCG SUBL, Place 1 tablet under the tongue daily at 12  noon., Disp: 100 tablet, Rfl: 1   diclofenac  Sodium (VOLTAREN ) 1 % GEL, Apply 4 g topically 4 (four) times daily., Disp: 100 g, Rfl: 1   DULoxetine  (CYMBALTA ) 60 MG capsule, Take 1 capsule (60 mg total) by mouth every morning., Disp: 90 capsule, Rfl: 0   estradiol  (ESTRACE ) 0.5 MG tablet, Take 1 tablet (0.5 mg total) by mouth daily., Disp: 90 tablet, Rfl: 0   famotidine  (PEPCID ) 20 MG tablet, Take 1 tablet (20 mg total) by mouth at bedtime., Disp: 30 tablet, Rfl: 1   fluticasone  (FLONASE ) 50 MCG/ACT nasal spray, Place 2 sprays into both nostrils every evening., Disp: 48 g, Rfl: 1   fluticasone -salmeterol (ADVAIR DISKUS) 250-50 MCG/ACT AEPB, Inhale 1 puff into the lungs in the morning and at bedtime., Disp: 60 each, Rfl: 11   levothyroxine  (SYNTHROID ) 100 MCG tablet, Take 1 tablet (100 mcg total) by mouth in the morning. Take 30-60 minutes before breakfast, Disp: 30 tablet, Rfl: 5   traZODone  (DESYREL ) 50 MG tablet, TAKE ONE-HALF TO ONE TABLET BY MOUTH EVERY DAY AT BEDTIME AS NEEDED FOR SLEEP, Disp: 90 tablet, Rfl: 0   memantine (NAMENDA) 10 MG tablet, Take 1 tablet by mouth daily., Disp: , Rfl:   Allergies  Allergen Reactions   Codeine Itching   Sulfamethoxazole-Trimethoprim Other (See Comments) and Rash   Citalopram Itching   Omeprazole  Palpitations    Also weakness   Tetracycline Other (See Comments)    Other Reaction: URTICARIA   Tetracyclines & Related Itching    ROS  Ten systems reviewed and is negative except as mentioned in HPI    Objective  Vitals:   10/06/23 1108  BP: 106/74  Pulse: 87  Resp: 16  Temp: (!) 97.5 F (36.4 C)  TempSrc: Oral  SpO2: 96%  Weight: 189 lb 6.4 oz (85.9 kg)  Height: 5' 7 (1.702 m)    Body mass index is 29.66 kg/m.    Physical Exam  Constitutional: Patient appears well-developed and well-nourished. Obese  No distress.  HEENT: head atraumatic, normocephalic, pupils equal and reactive to light, subconjunctival hemorrhage of left eye -  lower lateral aspect , neck supple, throat within normal limits Cardiovascular: Normal rate, regular rhythm and normal heart sounds.  No murmur heard. No BLE edema. Pulmonary/Chest: Effort normal and breath sounds normal. No respiratory distress. Abdominal: Soft.  There is no tenderness. Muscular skeletal: pain with abduction, internal rotation of right shoulder Psychiatric: Patient has a normal mood and affect. behavior is normal. Judgment and thought content normal.   Recent Results (from the past 2160 hours)  Nitric oxide      Status: None   Collection Time: 08/12/23  3:35 PM  Result Value Ref Range   Nitric Oxide  16   Pulmonary Function Test [facility] Only     Status: None   Collection Time: 09/25/23 10:41 AM  Result Value Ref Range   FVC-Pre  2.17 L   FVC-%Pred-Pre 66 %   FVC-Post 2.31 L   FVC-%Pred-Post 70 %   FVC-%Change-Post 6 %   FEV1-Pre 1.85 L   FEV1-%Pred-Pre 75 %   FEV1-Post 2.06 L   FEV1-%Pred-Post 83 %   FEV1-%Change-Post 11 %   FEV6-Pre 2.17 L   FEV6-%Pred-Pre 69 %   FEV6-Post 2.31 L   FEV6-%Pred-Post 73 %   FEV6-%Change-Post 6 %   Pre FEV1/FVC ratio 85 %   FEV1FVC-%Pred-Pre 113 %   Post FEV1/FVC ratio 89 %   FEV1FVC-%Change-Post 4 %   Pre FEV6/FVC Ratio 100 %   FEV6FVC-%Pred-Pre 104 %   Post FEV6/FVC ratio 100 %   FEV6FVC-%Pred-Post 104 %   FEF 25-75 Pre 2.29 L/sec   FEF2575-%Pred-Pre 118 %   FEF 25-75 Post 3.61 L/sec   FEF2575-%Pred-Post 186 %   FEF2575-%Change-Post 57 %   RV 1.94 L   RV % pred 80 %   TLC 4.33 L   TLC % pred 78 %   DLCO unc 13.58 ml/min/mmHg   DLCO unc % pred 63 %   DL/VA 5.82 ml/min/mmHg/L   DL/VA % pred 896 %     Assessment & Plan     Right Shoulder Pain Pain and limited range of motion in the right shoulder following a fall two months ago. Symptoms suggestive of impingement syndrome. No prior shoulder issues. -Start Naproxen  500mg  twice daily for one week for inflammation explained importance of staying hydrated and not to take  other nsaid's - short term use due to CKI stage IIIa -Refer to physical therapy for further management. -If no improvement with physical therapy and anti-inflammatory, consider referral to orthopedics for further evaluation. -Consider MRI if symptoms persist to rule out a tear.   CKI stage IIIa - we will recheck levels next visit, giving her NsAID's for short term use only  Subconjunctival Hemorrhage Noted in the right eye, likely secondary to coughing. No impact on vision. -Reassurance given that it will resolve on its own over weeks to months.  Memory Concerns Patient expressed concerns about memory loss. Currently on Duloxetine . -Continue follow-up with neurologist.  Chronic Cough Ongoing since thyroid  surgery. -Continue follow-up with Dr. Tamea.  Prescriptions -Send Naproxen  prescription [facility] [address]. -Send Diclofenac  G[facility] to [facility]  Follow-up -Keep next follow-up appointment.

## 2023-10-09 ENCOUNTER — Ambulatory Visit: Payer: Medicare Other

## 2023-10-12 ENCOUNTER — Encounter: Payer: Self-pay | Admitting: Pulmonary Disease

## 2023-10-15 ENCOUNTER — Other Ambulatory Visit: Payer: Self-pay | Admitting: Family Medicine

## 2023-10-15 ENCOUNTER — Other Ambulatory Visit: Payer: Self-pay | Admitting: Nurse Practitioner

## 2023-10-15 DIAGNOSIS — R053 Chronic cough: Secondary | ICD-10-CM

## 2023-10-15 DIAGNOSIS — Z780 Asymptomatic menopausal state: Secondary | ICD-10-CM

## 2023-10-15 DIAGNOSIS — F331 Major depressive disorder, recurrent, moderate: Secondary | ICD-10-CM

## 2023-10-15 DIAGNOSIS — F5101 Primary insomnia: Secondary | ICD-10-CM

## 2023-10-15 DIAGNOSIS — E7800 Pure hypercholesterolemia, unspecified: Secondary | ICD-10-CM

## 2023-10-16 NOTE — Telephone Encounter (Signed)
Requested Prescriptions  Pending Prescriptions Disp Refills   traZODone (DESYREL) 50 MG tablet [Pharmacy Med Name: TRAZODONE HCL 50MG  TAB] 90 tablet 0    Sig: TAKE ONE-HALF TO ONE TABLET BY MOUTH EVERY DAY AT BEDTIME AS NEEDED FOR SLEEP     Psychiatry: Antidepressants - Serotonin Modulator Passed - 10/16/2023 12:03 PM      Passed - Completed PHQ-2 or PHQ-9 in the last 360 days      Passed - Valid encounter within last 6 months    Recent Outpatient Visits           3 months ago Moderate episode of recurrent major depressive disorder [facility])   [facility] [place], Danna Hefty, MD   9 months ago Moderate episode of recurrent major depressive disorder [facility])   [facility] Alba Cory, MD   1 year ago Upper respiratory tract infection, unspecified type   [facility] Mecum, Oswaldo Conroy, PA-C   1 year ago Well adult exam   [facility] Alba Cory, MD   1 year ago Stage 3a chronic kidney disease [facility])   [facility] Alba Cory, MD       Future Appointments             In 2 weeks Salena Saner, MD [facility] [place]   In 2 months Alba Cory, MD [facility]             estradiol (ESTRACE) 0.5 MG tablet [Pharmacy Med Name: ESTRADIOL 0.5MG  TAB] 90 tablet 0    Sig: TAKE ONE TABLET BY MOUTH EVERY DAY     OB/GYN:  Estrogens Passed - 10/16/2023 12:03 PM      Passed - Mammogram is up-to-date per Health Maintenance      Passed - Last BP in normal range    BP Readings from Last 1 Encounters:  10/06/23 106/74         Passed - Valid encounter within last 12 months    Recent Outpatient Visits           3 months ago Moderate episode of recurrent major depressive disorder [facility])   [facility] [place], Danna Hefty, MD   9 months ago Moderate episode of recurrent  major depressive disorder [facility])   [facility] Alba Cory, MD   1 year ago Upper respiratory tract infection, unspecified type   [facility] Mecum, Oswaldo Conroy, PA-C   1 year ago Well adult exam   [facility] Alba Cory, MD   1 year ago Stage 3a chronic kidney disease [facility]   [facility] Alba Cory, MD       Future Appointments             In 2 weeks Salena Saner, MD [facility] at [place]   In 2 months Alba Cory, MD [facility]

## 2023-10-19 ENCOUNTER — Ambulatory Visit
Admission: RE | Admit: 2023-10-19 | Discharge: 2023-10-19 | Disposition: A | Discharge status: Home / Self Care | Payer: Medicare Other | Source: Ambulatory Visit | Attending: Family Medicine | Admitting: Family Medicine

## 2023-10-19 DIAGNOSIS — R053 Chronic cough: Secondary | ICD-10-CM | POA: Insufficient documentation

## 2023-10-19 DIAGNOSIS — J479 Bronchiectasis, uncomplicated: Secondary | ICD-10-CM | POA: Diagnosis present

## 2023-10-19 NOTE — Progress Notes (Signed)
 I have notified the patient. Nothing further needed.

## 2023-11-05 ENCOUNTER — Ambulatory Visit: Payer: Medicare Other | Admitting: Pulmonary Disease

## 2023-11-05 ENCOUNTER — Encounter: Payer: Self-pay | Admitting: Pulmonary Disease

## 2023-11-05 ENCOUNTER — Other Ambulatory Visit
Admission: RE | Admit: 2023-11-05 | Discharge: 2023-11-05 | Disposition: A | Discharge status: Home / Self Care | Source: Ambulatory Visit | Attending: Pulmonary Disease | Admitting: Pulmonary Disease

## 2023-11-05 VITALS — BP 128/68 | HR 73 | Ht 67.0 in | Wt 196.0 lb

## 2023-11-05 DIAGNOSIS — J45991 Cough variant asthma: Secondary | ICD-10-CM | POA: Insufficient documentation

## 2023-11-05 DIAGNOSIS — R053 Chronic cough: Secondary | ICD-10-CM

## 2023-11-05 DIAGNOSIS — J479 Bronchiectasis, uncomplicated: Secondary | ICD-10-CM | POA: Diagnosis not present

## 2023-11-05 DIAGNOSIS — J3089 Other allergic rhinitis: Secondary | ICD-10-CM

## 2023-11-05 LAB — NITRIC OXIDE: Nitric Oxide: 8

## 2023-11-05 LAB — CBC WITH DIFFERENTIAL/PLATELET
Abs Immature Granulocytes: 0.01 10*3/uL (ref 0.00–0.07)
Basophils Absolute: 0 10*3/uL (ref 0.0–0.1)
Basophils Relative: 1 %
Eosinophils Absolute: 0.4 10*3/uL (ref 0.0–0.5)
Eosinophils Relative: 8 %
HCT: 38.6 % (ref 36.0–46.0)
Hemoglobin: 12.5 g/dL (ref 12.0–15.0)
Immature Granulocytes: 0 %
Lymphocytes Relative: 29 %
Lymphs Abs: 1.5 10*3/uL (ref 0.7–4.0)
MCH: 30.6 pg (ref 26.0–34.0)
MCHC: 32.4 g/dL (ref 30.0–36.0)
MCV: 94.4 fL (ref 80.0–100.0)
Monocytes Absolute: 0.5 10*3/uL (ref 0.1–1.0)
Monocytes Relative: 9 %
Neutro Abs: 2.7 10*3/uL (ref 1.7–7.7)
Neutrophils Relative %: 53 %
Platelets: 237 10*3/uL (ref 150–400)
RBC: 4.09 MIL/uL (ref 3.87–5.11)
RDW: 15.2 % (ref 11.5–15.5)
WBC: 5.1 10*3/uL (ref 4.0–10.5)
nRBC: 0 % (ref 0.0–0.2)

## 2023-11-05 NOTE — Patient Instructions (Addendum)
 VISIT SUMMARY:  Lauren Johnston, a 73 year old female with a history of cough variant asthma, visited today due to a persistent cough that has continued post-surgery for a thyroid goiter. The cough is particularly troublesome at night and is accompanied by phlegm production. She has been using an inhaler and cough medicine for temporary relief. There are no signs of reflux or increased airway inflammation. A recent scan showed a small ground glass nodule likely related to inflammation or asthma.  YOUR PLAN:  -COUGH VARIANT ASTHMA: Cough variant asthma is a type of asthma where the main symptom is a dry, non-productive cough. It was initially triggered by a thyroid goiter pressing on your trachea but has persisted even after surgery. We recommend starting Zyrtec(certirizine) one tablet at bedtime, avoiding mentholated cough drops, and using non-mentholated alternatives like [employer] or [employer]. We will also order blood work to test for allergies and consider allergy shots if needed. Please monitor your symptoms and follow up in two months. We will also keep an eye on the small ground glass nodule found in your recent scan.  -ALLERGIC RHINITIS: Allergic rhinitis is an allergic reaction that causes sneezing, congestion, and a runny nose. It may be contributing to your cough, possibly due to allergens from your houseplants. We will order blood work to test for allergies and consider allergy shots if the results indicate they are necessary.  INSTRUCTIONS:  Please schedule a follow-up appointment in two months. We will review the radiologist's report on your recent scan and monitor the ground glass nodule at that time.  For more information, you can read your full clinical note, available in your patient portal.

## 2023-11-05 NOTE — Progress Notes (Signed)
 Subjective:    Patient ID: Lauren Johnston, female    DOB: 01/18/1951, 73 y.o.   MRN: [id number]  Patient Care Team: Alba Cory, MD as PCP - General (Family Medicine) Tedd Sias, Marlana Salvage, MD as Physician Assistant (Endocrinology) Lyndle Herrlich, MD as Consulting Physician (Orthopedic Surgery)  Chief Complaint  Patient presents with   Cough    Chronic, worse during night and in morning; concerned about allergies, never seen allergist/had allergy testing; never taken OTC allergy meds    BACKGROUND/INTERVAL: Patient is a 73 year old lifelong never smoker is here for issues of excessive cough and phlegm production. 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 She underwent thyroidectomy on 18 June 2020. Since then she had noted that her phlegm production has decreased significantly.  However, that issues of recurrent cough and sputum production particularly at nighttime when she lays down.  She was last seen on 01 October 2023.  At that time she was noted to have cough variant asthma and was started on Advair discus 250/50.  HPI Discussed the use of AI scribe software for clinical note transcription with the patient, who gave verbal consent to proceed.  History of Present Illness   NYRIE SIGAL is a 73 year old female with cough variant asthma who presents with persistent cough.  She follows up for a persistent cough, initially triggered by a thyroid goiter impinging on the trachea, which returned approximately a year or 2 after surgical intervention. The cough is continuous at night and is associated with phlegm production.  The use of an inhaler provides some relief, and she has been using cough medicine, which helps to reduce the cough temporarily. No reflux or burning in her chest, but there is phlegm in her throat.  She has not had any fevers, chills or sweats.  Sputum produces whitish to clear.  No hemoptysis.  She mentions having various houseplants, though none are flowering,  and is unsure if they contribute to her symptoms.      Review of Systems A 10 point review of systems was performed and it is as noted above otherwise negative.   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   Class 1 obesity in adult 11/11/2016   Osteopenia 10/09/2016   Rectal polyp    Right wrist tendonitis 05/16/2016   Osteoarthritis of left hip 05/16/2016   Vitamin D deficiency 02/05/2016   Degeneration of intervertebral disc of cervical region 02/05/2016   Insomnia 04/23/2015   Major depression in remission (HCC) 04/23/2015   Chronic pain 04/23/2015   Goiter, nontoxic, 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   Hypothyroidism, 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   Artificial Tear Solution (SYSTANE CONTACTS) SOLN Apply to eye.   atorvastatin (LIPITOR) 40 MG tablet Take 1 tablet (40 mg total) by mouth at bedtime.   benzonatate (TESSALON) 200 MG capsule Take 1 capsule (200 mg total) by mouth 2 (two) times daily as needed for cough.   Biotin w/ Vitamins C & E  (HAIR SKIN & NAILS GUMMIES PO) Take 2 tablets by mouth every other day.   Cholecalciferol (VITAMIN D) 50 MCG (2000 UT) CAPS Take 1 capsule (2,000 Units total) by mouth daily.   Cyanocobalamin (B-12) 500 MCG SUBL Place 1 tablet under the tongue daily at 12 noon.   diclofenac Sodium (VOLTAREN) 1 % GEL Apply 4 g topically 4 (four) times daily.   DULoxetine (CYMBALTA) 60 MG capsule Take 1 capsule (60 mg total) by mouth every morning.   estradiol (ESTRACE) 0.5 MG tablet Take 1 tablet (0.5 mg total) by mouth daily.   famotidine (PEPCID) 20 MG tablet Take 1 tablet (20 mg total) by mouth at bedtime.   fluticasone (FLONASE) 50 MCG/ACT nasal spray Place 2 sprays into both nostrils every evening.   fluticasone-salmeterol (ADVAIR DISKUS) 250-50 MCG/ACT AEPB Inhale 1 puff into the lungs in the morning and at bedtime.   levothyroxine (SYNTHROID) 100 MCG tablet Take 1 tablet (100 mcg total) by mouth in the morning. Take 30-60 minutes before breakfast   naproxen (NAPROSYN) 500 MG tablet Take 1 tablet (500 mg total) by mouth 2 (two) times daily with a meal.   traZODone (DESYREL) 50 MG tablet TAKE ONE-HALF TO ONE TABLET BY MOUTH EVERY DAY AT BEDTIME AS NEEDED FOR SLEEP    Immunization History  Administered Date(s) Administered   Fluad Quad(high Dose 65+) 05/06/2019, 08/02/2020, 08/06/2021, 08/20/2022   Influenza Split 06/16/2007, 06/19/2008, 09/07/2008, 07/05/2009, 04/25/2010   Influenza, High Dose Seasonal PF 05/14/2017, 06/02/2018, 04/30/2023   Influenza, Seasonal, Injecte, Preservative Fre 06/25/2011, 05/26/2012, 05/18/2013   Influenza,inj,Quad PF,6+ Mos 06/07/2014, 04/23/2015, 05/07/2016   Influenza-Unspecified 06/07/2014   Moderna SARS-COV2 Booster Vaccination 05/30/2021, 04/30/2022   Moderna Sars-Covid-2 Vaccination 10/15/2019, 11/12/2019, 10/17/2020   Pneumococcal Conjugate-13 07/17/2017   Pneumococcal Polysaccharide-23 02/05/2016   Tdap 08/01/2010, 04/18/2021   Unspecified SARS-COV-2 Vaccination  04/30/2023   Zoster, Live 04/07/2011        Objective:   BP 128/68   Pulse 73   Ht 5\' 7"  (1.702 m)   Wt 196 lb (88.9 kg)   SpO2 97%   BMI 30.70 kg/m   SpO2: 97 %  GENERAL: Obese woman, no acute distress, fully ambulatory.  No conversational dyspnea.  Looks younger than stated age. HEAD: Normocephalic, atraumatic.  EYES: Pupils equal, round, reactive to light.  No scleral icterus.  MOUTH: Oral mucosa moist.  Clear postnasal drip noted. NECK: Supple. Trachea midline. No JVD.  No adenopathy.  Well-healed thyroidectomy scar. PULMONARY: Good air entry bilaterally.  Lungs clear to auscultation bilaterally. CARDIOVASCULAR: S1 and S2. Regular rate and rhythm.  No rubs, murmurs or gallops. GASTROINTESTINAL: Benign. MUSCULOSKELETAL: No joint deformity, no clubbing, no edema.  NEUROLOGIC: No focal deficits, no gait disturbance.  Speech is fluent.  Appears forgetful at times. SKIN: Intact,warm,dry.  Limited exam, no rashes PSYCH: Mood and behavior appropriate.  Lab Results  Component Value Date   NITRICOXIDE 8 11/05/2023  *No evidence of type II inflammation.       Assessment & Plan:  ICD-10-CM   1. Chronic cough  R05.3 Nitric oxide    2. Cough variant asthma  J45.991 CBC with Differential/Platelet    Allergen Panel (27) + IGE    3. Bronchiectasis without complication (HCC) - suspected  J47.9     4. Perennial allergic rhinitis  J30.89       Orders Placed This Encounter  Procedures   CBC with Differential/Platelet    Standing Status:   Future    Number of Occurrences:   1    Expected Date:   11/05/2023    Expiration Date:   11/04/2024   Allergen Panel (27) + IGE    Standing Status:   Future    Number of Occurrences:   1    Expiration Date:   11/04/2024   Nitric oxide   Discussion:    Cough Variant Asthma Chronic cough likely due to cough variant asthma, initially triggered by a thyroid goiter impinging on the trachea but recurrent sometime post-surgery. Inhaler  provides some relief, but nighttime cough with phlegm persists. No signs of reflux or increased airway inflammation. Recent scan showed a small ground glass nodule, likely related to inflammation or asthma, with no significant scarring or bronchiectasis. Discussed Zyrtec at bedtime and avoiding mentholated cough drops.  Biologics may be necessary if blood work indicates allergies.  - Start Zyrtec at bedtime - Order blood work to test for allergies - Avoid mentholated cough drops - Use non-mentholated alternatives like [employer]administrative [employer] or [employer] - Schedule follow-up in two months - Monitor ground glass nodule on follow-up  Allergic Rhinitis Possible allergic component contributing to the cough. Multiple houseplants could be potential allergens. Discussed the potential need for biologics if blood work indicates allergic phenotype. - Order blood work to test for allergies - Consider allergy shots if indicated by test results  Follow-up - Schedule follow-up appointment in two months - Review radiologist's report on recent scan when available - Monitor ground glass nodule on follow-up.       Advised if symptoms do not improve or worsen, to please contact office for sooner follow up or seek emergency care.    I spent 40 minutes of dedicated to the care of this patient on the date of this encounter to include pre-visit review of records, face-to-face time with the patient discussing conditions above, post visit ordering of testing, clinical documentation with the electronic health record, making appropriate referrals as documented, and communicating necessary findings to members of the patients care team.     C. Danice Goltz, MD Advanced Bronchoscopy PCCM [facility]    *This note was generated using voice recognition software/Dragon and/or AI transcription program.  Despite best efforts to proofread, errors can occur which can change the meaning. Any transcriptional  errors that result from this process are unintentional and may not be fully corrected at the time of dictation.

## 2023-11-10 LAB — ALLERGEN PANEL (27) + IGE
Alternaria Alternata IgE: <0.1 kU/L
Aspergillus Fumigatus IgE: <0.1 kU/L
Bahia Grass IgE: <0.1 kU/L
Bermuda Grass IgE: <0.1 kU/L
Cat Dander IgE: <0.1 kU/L
Cedar, Mountain IgE: <0.1 kU/L
Cladosporium Herbarum IgE: <0.1 kU/L
Cocklebur IgE: <0.1 kU/L
Cockroach, American IgE: <0.1 kU/L
Common Silver Birch IgE: <0.1 kU/L
D Farinae IgE: <0.1 kU/L
D Pteronyssinus IgE: <0.1 kU/L
Dog Dander IgE: <0.1 kU/L
Elm, American IgE: <0.1 kU/L
Hickory, White IgE: <0.1 kU/L
IgE (Immunoglobulin E), Serum: 13 [IU]/mL (ref 6–495)
Johnson Grass IgE: <0.1 kU/L
Kentucky Bluegrass IgE: <0.1 kU/L
Maple/Box Elder IgE: <0.1 kU/L
Mucor Racemosus IgE: <0.1 kU/L
Oak, White IgE: <0.1 kU/L
Penicillium Chrysogen IgE: <0.1 kU/L
Pigweed, Rough IgE: <0.1 kU/L
Plantain, English IgE: <0.1 kU/L
Ragweed, Short IgE: <0.1 kU/L
Setomelanomma Rostrat: <0.1 kU/L
Timothy Grass IgE: <0.1 kU/L
White Mulberry IgE: <0.1 kU/L

## 2023-11-23 ENCOUNTER — Encounter: Payer: Self-pay | Admitting: Pulmonary Disease

## 2023-12-13 ENCOUNTER — Other Ambulatory Visit: Payer: Self-pay | Admitting: Family Medicine

## 2023-12-13 DIAGNOSIS — F331 Major depressive disorder, recurrent, moderate: Secondary | ICD-10-CM

## 2023-12-13 DIAGNOSIS — E7800 Pure hypercholesterolemia, unspecified: Secondary | ICD-10-CM

## 2023-12-13 DIAGNOSIS — Z780 Asymptomatic menopausal state: Secondary | ICD-10-CM

## 2024-01-05 ENCOUNTER — Ambulatory Visit: Payer: Medicare Other | Admitting: Family Medicine

## 2024-01-07 ENCOUNTER — Ambulatory Visit: Admitting: Pulmonary Disease

## 2024-01-07 ENCOUNTER — Encounter: Payer: Self-pay | Admitting: Pulmonary Disease

## 2024-01-07 VITALS — BP 110/80 | HR 68 | Temp 97.6°F | Ht 67.0 in | Wt 185.6 lb

## 2024-01-07 DIAGNOSIS — J479 Bronchiectasis, uncomplicated: Secondary | ICD-10-CM

## 2024-01-07 DIAGNOSIS — J3089 Other allergic rhinitis: Secondary | ICD-10-CM

## 2024-01-07 DIAGNOSIS — R053 Chronic cough: Secondary | ICD-10-CM | POA: Diagnosis not present

## 2024-01-07 DIAGNOSIS — J45991 Cough variant asthma: Secondary | ICD-10-CM | POA: Diagnosis not present

## 2024-01-07 DIAGNOSIS — K219 Gastro-esophageal reflux disease without esophagitis: Secondary | ICD-10-CM

## 2024-01-07 LAB — NITRIC OXIDE: Nitric Oxide: 12

## 2024-01-07 MED ORDER — MONTELUKAST SODIUM 10 MG PO TABS: 10.0000 mg | ORAL_TABLET | Freq: Every day | ORAL | 3 refills | Status: DC

## 2024-01-07 NOTE — Progress Notes (Addendum)
 Subjective:    Patient ID: Lauren Johnston, female    DOB: [birth year], 73 y.o.   MRN: [id number]  Patient Care Team: Sowles, Krichna, MD as PCP - General (Family Medicine) Damian Therisa HERO, MD as Physician Assistant (Endocrinology) Leora Lynwood SAUNDERS, MD as Consulting Physician (Orthopedic Surgery) Maree Jannett POUR, MD as Consulting Physician (Neurology)  Chief Complaint  Patient presents with   Follow-up    Cough. No wheezing or shortness of breath.     BACKGROUND/INTERVAL: Patient is a 73 year old lifelong never smoker is here for issues of excessive cough and phlegm production. 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 She underwent thyroidectomy on 18 June 2020. Since then she had noted that her phlegm production has decreased significantly.  However, she has had issues with recurrent cough and sputum production particularly at nighttime when she lays down.  She was last seen on 05 November 2023.  At that time she was instructed to continue Advair discus 250/50, start Zyrtec  at bedtime.  High-resolution chest CT has shown no evidence of interstitial lung disease.  Allergen panels have been negative.  No eosinophilia.  HPI Discussed the use of AI scribe software for clinical note transcription with the patient, who gave verbal consent to proceed.  History of Present Illness   Lauren Johnston is a 73 year old female who presents with persistent cough and phlegm production.   She experiences a persistent cough with phlegm production, particularly at night. The cough is less severe than before, but she keeps a spit cup by her bed due to the phlegm.  Overall however, she feels that her cough and sputum production are decreasing.  No reflux symptoms are present, which can sometimes exacerbate cough.  She has recently started using a CPAP machine, which she finds challenging to adjust to. This was started by neurology, Dr. Maree. Her husband is assisting her with the adjustment. She has been using  an inhaler for a few months, hoping it will reduce phlegm production.  She mentions having had COVID-19, which she feels has affected her memory. She is currently taking Namenda for memory issues. She does not take any allergy  medications.     Review of Systems A 10 point review of systems was performed and it is as noted above otherwise negative.   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   Class 1 obesity in adult 11/11/2016   Osteopenia 10/09/2016   Rectal polyp    Right wrist tendonitis 05/16/2016   Osteoarthritis of left hip 05/16/2016   Vitamin D  deficiency 02/05/2016   Degeneration of intervertebral disc of cervical region 02/05/2016   Insomnia 04/23/2015   Major depression in remission (HCC) 04/23/2015   Chronic pain 04/23/2015   Goiter, nontoxic, 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   Hypothyroidism, 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   Uterine leiomyoma 06/16/2007    Social History   Tobacco Use   Smoking status: Never   Smokeless tobacco: Never  Substance Use Topics   Alcohol use: No    Alcohol/week: 0.0 standard drinks of alcohol    Allergies  Allergen Reactions   Codeine Itching   Sulfamethoxazole-Trimethoprim Other (See Comments) and Rash  Citalopram Itching   Omeprazole  Palpitations    Also weakness   Tetracycline Other (See Comments)    Other Reaction: URTICARIA   Tetracyclines & Related Itching    Current Meds  Medication Sig   acetaminophen  (TYLENOL ) 500 MG tablet Take 500 mg by mouth every 6 (six) hours as needed.   Artificial Tear Solution (SYSTANE CONTACTS) SOLN Apply to eye.   atorvastatin  (LIPITOR) 40 MG tablet TAKE 1  TABLET BY MOUTH EVERYDAY AT BEDTIME   benzonatate  (TESSALON ) 200 MG capsule Take 1 capsule (200 mg total) by mouth 2 (two) times daily as needed for cough.   Biotin w/ Vitamins C & E (HAIR SKIN & NAILS GUMMIES PO) Take 2 tablets by mouth every other day.   Cholecalciferol  (VITAMIN D ) 50 MCG (2000 UT) CAPS Take 1 capsule (2,000 Units total) by mouth daily.   Cyanocobalamin  (B-12) 500 MCG SUBL Place 1 tablet under the tongue daily at 12 noon.   diclofenac  Sodium (VOLTAREN ) 1 % GEL Apply 4 g topically 4 (four) times daily.   DULoxetine  (CYMBALTA ) 60 MG capsule TAKE 1 CAPSULE BY MOUTH EVERY MORNING   estradiol  (ESTRACE ) 0.5 MG tablet TAKE 1 TABLET BY MOUTH EVERY DAY   famotidine  (PEPCID ) 20 MG tablet Take 1 tablet (20 mg total) by mouth at bedtime.   fluticasone  (FLONASE ) 50 MCG/ACT nasal spray Place 2 sprays into both nostrils every evening.   fluticasone -salmeterol (ADVAIR DISKUS) 250-50 MCG/ACT AEPB Inhale 1 puff into the lungs in the morning and at bedtime.   levothyroxine  (SYNTHROID ) 100 MCG tablet Take 1 tablet (100 mcg total) by mouth in the morning. Take 30-60 minutes before breakfast   montelukast  (SINGULAIR ) 10 MG tablet Take 1 tablet (10 mg total) by mouth daily.   naproxen  (NAPROSYN ) 500 MG tablet Take 1 tablet (500 mg total) by mouth 2 (two) times daily with a meal.   traZODone  (DESYREL ) 50 MG tablet TAKE ONE-HALF TO ONE TABLET BY MOUTH EVERY DAY AT BEDTIME AS NEEDED FOR SLEEP    Immunization History  Administered Date(s) Administered   Fluad Quad(high Dose 65+) 05/06/2019, 08/02/2020, 08/06/2021, 08/20/2022   Influenza Split 06/16/2007, 06/19/2008, 09/07/2008, 07/05/2009, 04/25/2010   Influenza, High Dose Seasonal PF 05/14/2017, 06/02/2018, 04/30/2023   Influenza, Seasonal, Injecte, Preservative Fre 06/25/2011, 05/26/2012, 05/18/2013   Influenza,inj,Quad PF,6+ Mos 06/07/2014, 04/23/2015, 05/07/2016   Influenza-Unspecified 06/07/2014   Moderna SARS-COV2 Booster Vaccination  05/30/2021, 04/30/2022   Moderna Sars-Covid-2 Vaccination 10/15/2019, 11/12/2019, 10/17/2020   Pneumococcal Conjugate-13 07/17/2017   Pneumococcal Polysaccharide-23 02/05/2016   Tdap 08/01/2010, 04/18/2021   Unspecified SARS-COV-2 Vaccination 04/30/2023   Zoster, Live 04/07/2011        Objective:     BP 110/80 (BP Location: Left Arm, Patient Position: Sitting, Cuff Size: Normal)   Pulse 68   Temp 97.6 F (36.4 C) (Temporal)   Ht 5' 7 (1.702 m)   Wt 185 lb 9.6 oz (84.2 kg)   SpO2 99%   BMI 29.07 kg/m   SpO2: 99 %  GENERAL: Obese woman, no acute distress, fully ambulatory.  No conversational dyspnea.  Looks younger than stated age. HEAD: Normocephalic, atraumatic.  EYES: Pupils equal, round, reactive to light.  No scleral icterus.  MOUTH: Oral mucosa moist.  Clear postnasal drip noted. NECK: Supple. Trachea midline. No JVD.  No adenopathy.  Well-healed thyroidectomy scar. PULMONARY: Good air entry bilaterally.  Lungs clear to auscultation bilaterally. CARDIOVASCULAR: S1 and S2. Regular rate and rhythm.  No rubs, murmurs or gallops. GASTROINTESTINAL: Benign. MUSCULOSKELETAL: No joint deformity,  no clubbing, no edema.  NEUROLOGIC: No focal deficits, no gait disturbance.  Speech is fluent.  Appears forgetful at times. SKIN: Intact,warm,dry.  Limited exam, no rashes PSYCH: Mood and behavior appropriate.    Lab Results  Component Value Date   NITRICOXIDE 12 01/07/2024  *No evidence of type II inflammation   Assessment & Plan:     ICD-10-CM   1. Chronic cough  R05.3 Nitric oxide     2. Cough variant asthma  J45.991     3. Bronchiectasis without complication (HCC) - RULED OUT  J47.9     4. Perennial allergic rhinitis  J30.89     5. LPRD (laryngopharyngeal reflux disease)  K21.9       Orders Placed This Encounter  Procedures   Nitric oxide     Meds ordered this encounter  Medications   montelukast  (SINGULAIR ) 10 MG tablet    Sig: Take 1 tablet (10 mg total) by  mouth daily.    Dispense:  90 tablet    Refill:  3   Discussion:    Cough with phlegm Chronic cough with nocturnal phlegm production, slightly improved but persistent. CPAP and inhaler may reduce phlegm. Singulair  introduced for cough control. - Prescribe Singulair  once daily in the morning.  No evidence of type II inflammation identified today. - Continue CPAP use. - Ensure inhaler refills are available.  Use of CPAP for sleep apnea Initiated CPAP for sleep apnea with initial adjustment difficulties. Mask fit guidance provided. Continued use expected to alleviate symptoms. - Continue CPAP use. - Followed by [facility].  Memory issues post-COVID Memory issues post-COVID managed with Namenda. No new concerns during visit. - Continue Namenda per Dr. Maree Certain if symptoms do not improve or worsen, to please contact office for sooner follow up or seek emergency care.    I spent 33 minutes of dedicated to the care of this patient on the date of this encounter to include pre-visit review of records, face-to-face time with the patient discussing conditions above, post visit ordering of testing, clinical documentation with the electronic health record, making appropriate referrals as documented, and communicating necessary findings to members of the patients care team.     C. Leita Sanders, MD Advanced Bronchoscopy PCCM [facility]    *This note was generated using voice recognition software/Dragon and/or AI transcription program.  Despite best efforts to proofread, errors can occur which can change the meaning. Any transcriptional errors that result from this process are unintentional and may not be fully corrected at the time of dictation.

## 2024-01-07 NOTE — Patient Instructions (Signed)
 VISIT SUMMARY:  Today, we discussed your persistent cough with phlegm, your use of CPAP for sleep apnea, and your memory issues following COVID-19. We have made some adjustments to your treatment plan to help manage these conditions more effectively.  YOUR PLAN:  -COUGH WITH PHLEGM: You have a chronic cough with phlegm, especially at night. This can be due to various reasons, including respiratory conditions. We have prescribed Singulair  to help control your cough. Please take it once daily in the morning. Continue using your CPAP machine and ensure you have refills for your inhaler.  -USE OF CPAP FOR SLEEP APNEA: Sleep apnea is a condition where your breathing stops and starts during sleep. You have started using a CPAP machine to help with this. Although it can be challenging to adjust to, continued use is expected to improve your symptoms. Your husband can assist you with this adjustment.  -MEMORY ISSUES POST-COVID: You have been experiencing memory issues following COVID-19. This is being managed with Namenda (memantine), and there are no new concerns at this time. Please continue taking Namenda (memantine) as prescribed by Dr. Mason Sole.  INSTRUCTIONS:  Please follow up with us  if you experience any new or worsening symptoms. Continue taking your medications as prescribed and using your CPAP machine. If you have any questions or concerns, do not hesitate to contact our office.  Will see you in follow-up in 4 months time call sooner should any new problems arise.

## 2024-01-08 ENCOUNTER — Other Ambulatory Visit: Payer: Self-pay | Admitting: Family Medicine

## 2024-01-08 DIAGNOSIS — F5101 Primary insomnia: Secondary | ICD-10-CM

## 2024-01-08 DIAGNOSIS — E7800 Pure hypercholesterolemia, unspecified: Secondary | ICD-10-CM

## 2024-01-08 DIAGNOSIS — Z780 Asymptomatic menopausal state: Secondary | ICD-10-CM

## 2024-01-08 DIAGNOSIS — F331 Major depressive disorder, recurrent, moderate: Secondary | ICD-10-CM

## 2024-01-08 DIAGNOSIS — R053 Chronic cough: Secondary | ICD-10-CM

## 2024-01-08 NOTE — Telephone Encounter (Unsigned)
 Copied from CRM [id number]. Topic: Clinical - Medication Refill >> Jan 08, 2024  3:04 PM Lauren Johnston wrote: Medication: memantine (NAMENDA) 10 MG tablet estradiol  (ESTRACE ) 0.5 MG tablet benzonatate  (TESSALON ) 200 MG capsule DULoxetine  (CYMBALTA ) 60 MG capsule atorvastatin  (LIPITOR) 40 MG tablet traZODone  (DESYREL ) 50 MG tablet   - Pt also has another medication request for -  Pantoprazole - 20 MG tablets   Has the patient contacted their pharmacy? Yes (Agent: If no, request that the patient contact the pharmacy for the refill. If patient does not wish to contact the pharmacy document the reason why and proceed with request.) (Agent: If yes, when and what did the pharmacy advise?)  This is the patient's preferred pharmacy:  [facility] - [place], [place] - [address] [place] [address] Phone: [phone] Fax: [phone]  Is this the correct pharmacy for this prescription? Yes If no, delete pharmacy and type the correct one.   Has the prescription been filled recently? No  Is the patient out of the medication? Yes, pt has a few  Has the patient been seen for an appointment in the last year OR does the patient have an upcoming appointment? Yes, pt was seen 01/05/24  Can we respond through MyChart? Yes  Agent: Please be advised that Rx refills may take up to 3 business days. We ask that you follow-up with your pharmacy.

## 2024-01-11 NOTE — Telephone Encounter (Signed)
 Requested medications are due for refill today.  yes  Requested medications are on the active medications list.  Most are  Last refill. varied  Future visit scheduled.   no  Notes to clinic.  Pt is requesting many refills. Pt last seen 12/24/2022 and n/s on 5 6 2025. Please advise.    Requested Prescriptions  Pending Prescriptions Disp Refills   memantine (NAMENDA) 10 MG tablet 30 tablet 11    Sig: Take 1 tablet (10 mg total) by mouth daily.     Neurology:  Alzheimer's Agents 2 Failed - 01/11/2024  2:40 PM      Failed - Cr in normal range and within 360 days    Creat  Date Value Ref Range Status  06/30/2023 1.07 (H) 0.60 - 1.00 mg/dL Final         Passed - eGFR is 5 or above and within 360 days    GFR, Est African American  Date Value Ref Range Status  10/15/2020 71 > OR = 60 mL/min/1.73m2 Final   GFR, Est Non African American  Date Value Ref Range Status  10/15/2020 61 > OR = 60 mL/min/1.51m2 Final   eGFR  Date Value Ref Range Status  06/30/2023 55 (L) > OR = 60 mL/min/1.6m2 Final         Passed - Valid encounter within last 6 months    Recent Outpatient Visits           3 months ago History of fall   [facility] [place], Krichna, MD               estradiol  (ESTRACE ) 0.5 MG tablet 30 tablet 0    Sig: Take 1 tablet (0.5 mg total) by mouth daily.     OB/GYN:  Estrogens Passed - 01/11/2024  2:40 PM      Passed - Mammogram is up-to-date per Health Maintenance      Passed - Last BP in normal range    BP Readings from Last 1 Encounters:  01/07/24 110/80         Passed - Valid encounter within last 12 months    Recent Outpatient Visits           3 months ago History of fall   [facility] [place], Krichna, MD               benzonatate  (TESSALON ) 200 MG capsule 180 capsule 0    Sig: Take 1 capsule (200 mg total) by mouth 2 (two) times daily as needed for cough.     Ear, Nose, and Throat:   Antitussives/Expectorants Passed - 01/11/2024  2:40 PM      Passed - Valid encounter within last 12 months    Recent Outpatient Visits           3 months ago History of fall   [facility] [place], Krichna, MD               DULoxetine  (CYMBALTA ) 60 MG capsule 30 capsule 0    Sig: Take 1 capsule (60 mg total) by mouth every morning.     Psychiatry: Antidepressants - SNRI - duloxetine  Failed - 01/11/2024  2:40 PM      Failed - Cr in normal range and within 360 days    Creat  Date Value Ref Range Status  06/30/2023 1.07 (H) 0.60 - 1.00 mg/dL Final         Passed - eGFR  is 30 or above and within 360 days    GFR, Est African American  Date Value Ref Range Status  10/15/2020 71 > OR = 60 mL/min/1.59m2 Final   GFR, Est Non African American  Date Value Ref Range Status  10/15/2020 61 > OR = 60 mL/min/1.41m2 Final   eGFR  Date Value Ref Range Status  06/30/2023 55 (L) > OR = 60 mL/min/1.92m2 Final         Passed - Completed PHQ-2 or PHQ-9 in the last 360 days      Passed - Last BP in normal range    BP Readings from Last 1 Encounters:  01/07/24 110/80         Passed - Valid encounter within last 6 months    Recent Outpatient Visits           3 months ago History of fall   [facility] [place], Krichna, MD               traZODone  (DESYREL ) 50 MG tablet 90 tablet 0     Psychiatry: Antidepressants - Serotonin Modulator Passed - 01/11/2024  2:40 PM      Passed - Completed PHQ-2 or PHQ-9 in the last 360 days      Passed - Valid encounter within last 6 months    Recent Outpatient Visits           3 months ago History of fall   [facility] [facility] [place], Krichna, MD               atorvastatin  (LIPITOR) 40 MG tablet 30 tablet 0     Cardiovascular:  Antilipid - Statins Failed - 01/11/2024  2:40 PM      Failed - Lipid Panel in normal range within the last 12 months    Cholesterol, Total   Date Value Ref Range Status  03/06/2016 161 100 - 199 mg/dL Final   Cholesterol  Date Value Ref Range Status  12/24/2022 143 <200 mg/dL Final   LDL Cholesterol (Calc)  Date Value Ref Range Status  12/24/2022 81 mg/dL (calc) Final    Comment:    Reference range: <100 . Desirable range <100 mg/dL for primary prevention;   <70 mg/dL for patients with CHD or diabetic patients  with > or = 2 CHD risk factors. Aaron Aas LDL-C is now calculated using the Martin-Hopkins  calculation, which is a validated novel method providing  better accuracy than the Friedewald equation in the  estimation of LDL-C.  Melinda Sprawls et al. Erroll Heard. 6295;284(13): 2061-2068  (http://education.[employer].com/faq/FAQ164)    HDL  Date Value Ref Range Status  12/24/2022 48 (L) > OR = 50 mg/dL Final  24/40/1027 55 >25 mg/dL Final   Triglycerides  Date Value Ref Range Status  12/24/2022 67 <150 mg/dL Final         Passed - Patient is not pregnant      Passed - Valid encounter within last 12 months    Recent Outpatient Visits           3 months ago History of fall   [facility] [place], Krichna, MD

## 2024-01-14 ENCOUNTER — Other Ambulatory Visit: Payer: Self-pay | Admitting: Family Medicine

## 2024-01-14 DIAGNOSIS — E7800 Pure hypercholesterolemia, unspecified: Secondary | ICD-10-CM

## 2024-01-14 DIAGNOSIS — Z780 Asymptomatic menopausal state: Secondary | ICD-10-CM

## 2024-01-27 ENCOUNTER — Other Ambulatory Visit: Payer: Self-pay

## 2024-01-27 DIAGNOSIS — G3184 Mild cognitive impairment, so stated: Secondary | ICD-10-CM

## 2024-02-01 ENCOUNTER — Ambulatory Visit
Admission: RE | Admit: 2024-02-01 | Discharge: 2024-02-01 | Disposition: A | Discharge status: Home / Self Care | Source: Ambulatory Visit

## 2024-02-01 ENCOUNTER — Other Ambulatory Visit

## 2024-02-01 DIAGNOSIS — G3184 Mild cognitive impairment, so stated: Secondary | ICD-10-CM | POA: Diagnosis present

## 2024-03-08 ENCOUNTER — Telehealth: Payer: Self-pay

## 2024-03-08 NOTE — Telephone Encounter (Signed)
 Copied from CRM [id number]. Topic: Clinical - Prescription Issue >> Mar 08, 2024  2:03 PM Fonda T wrote: Reason for CRM: Patient calling, states, per [facility], medications were denied for refills.  Patient is inquiring why refill request was denied, as she states the last time she was seen was 10/06/23.  Per patient request, needs all medications refilled that were prescribed by Dr. Glenard.  Per patient also states she had to pay for all medications out of pocket, and was costly, an expense that was unexpected.   Patient can be reached at [phone] to discuss further.

## 2024-03-08 NOTE — Telephone Encounter (Signed)
 Pt scheduled with Mliss for 03/09/24 REGULAR FOLLOW up

## 2024-03-09 ENCOUNTER — Ambulatory Visit (INDEPENDENT_AMBULATORY_CARE_PROVIDER_SITE_OTHER): Admitting: Nurse Practitioner

## 2024-03-09 ENCOUNTER — Encounter: Payer: Self-pay | Admitting: Nurse Practitioner

## 2024-03-09 VITALS — BP 118/74 | HR 79 | Temp 98.1°F | Resp 18 | Ht 67.0 in | Wt 186.7 lb

## 2024-03-09 DIAGNOSIS — F325 Major depressive disorder, single episode, in full remission: Secondary | ICD-10-CM

## 2024-03-09 DIAGNOSIS — R053 Chronic cough: Secondary | ICD-10-CM

## 2024-03-09 DIAGNOSIS — E7800 Pure hypercholesterolemia, unspecified: Secondary | ICD-10-CM

## 2024-03-09 DIAGNOSIS — Z780 Asymptomatic menopausal state: Secondary | ICD-10-CM | POA: Insufficient documentation

## 2024-03-09 DIAGNOSIS — F5101 Primary insomnia: Secondary | ICD-10-CM

## 2024-03-09 DIAGNOSIS — E538 Deficiency of other specified B group vitamins: Secondary | ICD-10-CM | POA: Insufficient documentation

## 2024-03-09 DIAGNOSIS — N1831 Chronic kidney disease, stage 3a: Secondary | ICD-10-CM

## 2024-03-09 DIAGNOSIS — E559 Vitamin D deficiency, unspecified: Secondary | ICD-10-CM

## 2024-03-09 DIAGNOSIS — G3184 Mild cognitive impairment, so stated: Secondary | ICD-10-CM

## 2024-03-09 DIAGNOSIS — M170 Bilateral primary osteoarthritis of knee: Secondary | ICD-10-CM

## 2024-03-09 DIAGNOSIS — Z130 Encounter for screening for diseases of the blood and blood-forming organs and certain disorders involving the immune mechanism: Secondary | ICD-10-CM

## 2024-03-09 DIAGNOSIS — F331 Major depressive disorder, recurrent, moderate: Secondary | ICD-10-CM | POA: Insufficient documentation

## 2024-03-09 DIAGNOSIS — E890 Postprocedural hypothyroidism: Secondary | ICD-10-CM

## 2024-03-09 DIAGNOSIS — I739 Peripheral vascular disease, unspecified: Secondary | ICD-10-CM

## 2024-03-09 DIAGNOSIS — R432 Parageusia: Secondary | ICD-10-CM | POA: Diagnosis not present

## 2024-03-09 DIAGNOSIS — E042 Nontoxic multinodular goiter: Secondary | ICD-10-CM

## 2024-03-09 LAB — COMPREHENSIVE METABOLIC PANEL WITH GFR
AG Ratio: 1.4 (calc) (ref 1.0–2.5)
ALT: 12 U/L (ref 6–29)
AST: 15 U/L (ref 10–35)
Albumin: 3.8 g/dL (ref 3.6–5.1)
Alkaline phosphatase (APISO): 72 U/L (ref 37–153)
BUN/Creatinine Ratio: 12 (calc) (ref 6–22)
BUN: 14 mg/dL (ref 7–25)
CO2: 30 mmol/L (ref 20–32)
Calcium: 9.4 mg/dL (ref 8.6–10.4)
Chloride: 102 mmol/L (ref 98–110)
Creat: 1.2 mg/dL — ABNORMAL HIGH (ref 0.60–1.00)
Globulin: 2.8 g/dL (ref 1.9–3.7)
Glucose, Bld: 67 mg/dL (ref 65–99)
Potassium: 4 mmol/L (ref 3.5–5.3)
Sodium: 139 mmol/L (ref 135–146)
Total Bilirubin: 0.7 mg/dL (ref 0.2–1.2)
Total Protein: 6.6 g/dL (ref 6.1–8.1)
eGFR: 48 mL/min/1.73m2 — ABNORMAL LOW (ref >=60)

## 2024-03-09 LAB — CBC WITH DIFFERENTIAL/PLATELET
Absolute Lymphocytes: 1728 {cells}/uL (ref 850–3900)
Absolute Monocytes: 432 {cells}/uL (ref 200–950)
Basophils Absolute: 29 {cells}/uL (ref 0–200)
Basophils Relative: 0.6 %
Eosinophils Absolute: 173 {cells}/uL (ref 15–500)
Eosinophils Relative: 3.6 %
HCT: 40.9 % (ref 35.0–45.0)
Hemoglobin: 13.1 g/dL (ref 11.7–15.5)
MCH: 31.6 pg (ref 27.0–33.0)
MCHC: 32 g/dL (ref 32.0–36.0)
MCV: 98.6 fL (ref 80.0–100.0)
MPV: 10.1 fL (ref 7.5–12.5)
Monocytes Relative: 9 %
Neutro Abs: 2438 {cells}/uL (ref 1500–7800)
Neutrophils Relative %: 50.8 %
Platelets: 286 Thousand/uL (ref 140–400)
RBC: 4.15 Million/uL (ref 3.80–5.10)
RDW: 12.7 % (ref 11.0–15.0)
Total Lymphocyte: 36 %
WBC: 4.8 Thousand/uL (ref 3.8–10.8)

## 2024-03-09 LAB — TSH: TSH: 43.7 m[IU]/L — ABNORMAL HIGH (ref 0.40–4.50)

## 2024-03-09 LAB — LIPID PANEL
Cholesterol: 170 mg/dL (ref <200)
HDL: 56 mg/dL (ref >=50)
LDL Cholesterol (Calc): 99 mg/dL
Non-HDL Cholesterol (Calc): 114 mg/dL (ref <130)
Total CHOL/HDL Ratio: 3 (calc) (ref <5.0)
Triglycerides: 66 mg/dL (ref <150)

## 2024-03-09 LAB — VITAMIN B12: Vitamin B-12: 500 pg/mL (ref 200–1100)

## 2024-03-09 LAB — VITAMIN D 25 HYDROXY (VIT D DEFICIENCY, FRACTURES): Vit D, 25-Hydroxy: 53 ng/mL (ref 30–100)

## 2024-03-09 MED ORDER — ATORVASTATIN CALCIUM 40 MG PO TABS: 40.0000 mg | ORAL_TABLET | Freq: Every day | ORAL | 1 refills | Status: DC

## 2024-03-09 MED ORDER — DULOXETINE HCL 60 MG PO CPEP: 60.0000 mg | ORAL_CAPSULE | Freq: Every morning | ORAL | 1 refills | Status: DC

## 2024-03-09 MED ORDER — ESTRADIOL 0.5 MG PO TABS: 0.5000 mg | ORAL_TABLET | Freq: Every day | ORAL | 1 refills | Status: DC

## 2024-03-09 MED ORDER — TRAZODONE HCL 50 MG PO TABS: 50.0000 mg | ORAL_TABLET | Freq: Every evening | ORAL | 1 refills | Status: DC | PRN
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
## 2024-03-10 ENCOUNTER — Ambulatory Visit: Payer: Self-pay | Admitting: Nurse Practitioner

## 2024-05-11 ENCOUNTER — Ambulatory Visit: Admitting: Pulmonary Disease

## 2024-05-11 ENCOUNTER — Encounter: Payer: Self-pay | Admitting: Pulmonary Disease

## 2024-05-11 VITALS — BP 136/86 | HR 73 | Temp 97.9°F | Ht 67.0 in | Wt 187.4 lb

## 2024-05-11 DIAGNOSIS — J45991 Cough variant asthma: Secondary | ICD-10-CM | POA: Diagnosis not present

## 2024-05-11 DIAGNOSIS — K219 Gastro-esophageal reflux disease without esophagitis: Secondary | ICD-10-CM

## 2024-05-11 DIAGNOSIS — R413 Other amnesia: Secondary | ICD-10-CM

## 2024-05-11 DIAGNOSIS — G3184 Mild cognitive impairment, so stated: Secondary | ICD-10-CM

## 2024-05-11 DIAGNOSIS — G4733 Obstructive sleep apnea (adult) (pediatric): Secondary | ICD-10-CM

## 2024-05-11 DIAGNOSIS — R053 Chronic cough: Secondary | ICD-10-CM

## 2024-05-11 DIAGNOSIS — J3089 Other allergic rhinitis: Secondary | ICD-10-CM

## 2024-05-11 MED ORDER — ALBUTEROL SULFATE HFA 108 (90 BASE) MCG/ACT IN AERS: 2.0000 | INHALATION_SPRAY | Freq: Four times a day (QID) | RESPIRATORY_TRACT | 2 refills | Status: DC | PRN

## 2024-05-11 NOTE — Patient Instructions (Addendum)
 VISIT SUMMARY:  Today, we discussed your ongoing asthma management and memory issues. You are using your Advair inhaler regularly, although you find it only partially effective at times. We also talked about your memory lapses, which have been a concern since your COVID-19 infection.  YOUR PLAN:  -COUGH VARIANT ASTHMA: Cough variant asthma is a type of asthma where the main symptom is a dry, non-productive cough. It is important to continue using your Advair inhaler as prescribed to control your asthma symptoms. Remember to rinse your mouth after each use to prevent any oral side effects.  Your Advair prescription is up-to-date.  We did send in a prescription for your rescue inhaler which is albuterol  this is an inhaler that you can use just as needed.  -MEMORY IMPAIRMENT: Memory impairment can occur after a COVID-19 infection and may cause difficulty in recalling small details. It is good that you can still perform daily activities like driving without getting lost. We will continue to monitor your memory issues and address any concerns as they arise.  INSTRUCTIONS:  Please continue using your Advair inhaler as prescribed and ensure you rinse your mouth after each use. If you notice any worsening of your asthma symptoms or memory issues, please schedule a follow-up appointment.

## 2024-05-11 NOTE — Progress Notes (Signed)
 Subjective:    Patient ID: Lauren Johnston, female    DOB: February 13, 1951, 73 y.o.   MRN: [id number]  Patient Care Team: Sowles, Krichna, MD as PCP - General (Family Medicine) Damian Therisa HERO, MD as Physician Assistant (Endocrinology) Leora Lynwood SAUNDERS, MD as Consulting Physician (Orthopedic Surgery) Maree Jannett POUR, MD as Consulting Physician (Neurology)  Chief Complaint  Patient presents with   Follow-up    Occasional cough with clear phlegm. No shortness of breath or wheezing.     BACKGROUND/INTERVAL: Patient is a 73 year old lifelong never smoker is here for issues of excessive cough and phlegm production. 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 She underwent thyroidectomy on 18 June 2020. Since then she had noted that her phlegm production has decreased significantly.  However, she has had issues with recurrent cough and sputum production particularly at nighttime when she lays down.  She was last seen on 07 Jan 2024. High-resolution chest CT has shown no evidence of interstitial lung disease.  Allergen panels have been negative.  No eosinophilia.   HPI Discussed the use of AI scribe software for clinical note transcription with the patient, who gave verbal consent to proceed.  History of Present Illness   Lauren Johnston is a 73 year old female with asthma who presents for follow-up on asthma management and memory issues.  She experiences ongoing issues with asthma and uses an Advair inhaler regularly.  She finds it only partially effective at times. Despite her dislike for the inhaler, she ensures to rinse her mouth after use to prevent oral side effects.  She continues to have issues with postnasal drainage which is likely the main driver for her cough.  She does note that her cough has decreased significantly.  She does not endorse shortness of breath nor wheezing.  She also experiences memory issues that have persisted since her COVID-19 infection. She has difficulty recalling  small details but can still drive and does not get lost. Her husband is concerned about these memory lapses, which she finds frustrating.   She has issues with post thyroidectomy hypothyroidism and follows with [facility] for this.  She has been diagnosed with sleep apnea and follows up with [facility] for this.  She states she is compliant with CPAP.    Review of Systems A 10 point review of systems was performed and it is as noted above otherwise negative.   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 10/15/2020   Pure hypercholesterolemia 10/15/2020   Stage 3a chronic kidney disease (HCC) 10/15/2020   Mild cognitive impairment 10/15/2020   S/P total thyroidectomy 06/18/2020   Class 1 obesity in adult 11/11/2016   Osteopenia 10/09/2016   Rectal polyp    Right wrist tendonitis 05/16/2016   Osteoarthritis of left hip 05/16/2016   Vitamin D  deficiency 02/05/2016   Degeneration of intervertebral disc of cervical region 02/05/2016   Insomnia 04/23/2015   Major depression in remission (HCC) 04/23/2015   Chronic pain 04/23/2015   Goiter, nontoxic, 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   Hypothyroidism, 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   Uterine leiomyoma 06/16/2007    Social History  Tobacco Use   Smoking status: Never   Smokeless tobacco: Never  Substance Use Topics   Alcohol use: No    Alcohol/week: 0.0 standard drinks of alcohol    Allergies  Allergen Reactions   Codeine Itching   Sulfamethoxazole-Trimethoprim Other (See Comments) and Rash   Citalopram Itching   Omeprazole  Palpitations    Also weakness    Tetracycline Other (See Comments)    Other Reaction: URTICARIA   Tetracyclines & Related Itching    Current Meds  Medication Sig   acetaminophen  (TYLENOL ) 500 MG tablet Take 500 mg by mouth every 6 (six) hours as needed.   albuterol  (VENTOLIN  HFA) 108 (90 Base) MCG/ACT inhaler Inhale 2 puffs into the lungs every 6 (six) hours as needed.   Artificial Tear Solution (SYSTANE CONTACTS) SOLN Apply to eye.   atorvastatin  (LIPITOR) 40 MG tablet Take 1 tablet (40 mg total) by mouth daily.   Biotin w/ Vitamins C & E (HAIR SKIN & NAILS GUMMIES PO) Take 2 tablets by mouth every other day.   Cholecalciferol  (VITAMIN D ) 50 MCG (2000 UT) CAPS Take 1 capsule (2,000 Units total) by mouth daily.   Cyanocobalamin  (B-12) 500 MCG SUBL Place 1 tablet under the tongue daily at 12 noon.   diclofenac  Sodium (VOLTAREN ) 1 % GEL Apply 4 g topically 4 (four) times daily.   DULoxetine  (CYMBALTA ) 60 MG capsule Take 1 capsule (60 mg total) by mouth every morning.   estradiol  (ESTRACE ) 0.5 MG tablet Take 1 tablet (0.5 mg total) by mouth daily.   fluticasone  (FLONASE ) 50 MCG/ACT nasal spray Place 2 sprays into both nostrils every evening.   fluticasone -salmeterol (ADVAIR DISKUS) 250-50 MCG/ACT AEPB Inhale 1 puff into the lungs in the morning and at bedtime.   levothyroxine  (SYNTHROID ) 100 MCG tablet Take 1 tablet (100 mcg total) by mouth in the morning. Take 30-60 minutes before breakfast   memantine (NAMENDA) 10 MG tablet Take 1 tablet by mouth daily.   naproxen  (NAPROSYN ) 500 MG tablet Take 1 tablet (500 mg total) by mouth 2 (two) times daily with a meal.   traZODone  (DESYREL ) 50 MG tablet Take 1 tablet (50 mg total) by mouth at bedtime as needed for sleep.    Immunization History  Administered Date(s) Administered   Fluad Quad(high Dose 65+) 05/06/2019, 08/02/2020, 08/06/2021, 08/20/2022   INFLUENZA, HIGH DOSE SEASONAL PF 05/14/2017, 06/02/2018, 04/30/2023, 05/19/2024   Influenza Split 06/16/2007, 06/19/2008,  09/07/2008, 07/05/2009, 04/25/2010   Influenza, Seasonal, Injecte, Preservative Fre 06/25/2011, 05/26/2012, 05/18/2013   Influenza,inj,Quad PF,6+ Mos 06/07/2014, 04/23/2015, 05/07/2016   Influenza-Unspecified 06/07/2014   Moderna SARS-COV2 Booster Vaccination 05/30/2021, 04/30/2022, 05/19/2024   Moderna Sars-Covid-2 Vaccination 10/15/2019, 11/12/2019, 10/17/2020   Pneumococcal Conjugate-13 07/17/2017   Pneumococcal Polysaccharide-23 02/05/2016   Tdap 08/01/2010, 04/18/2021   Unspecified SARS-COV-2 Vaccination 04/30/2023   Zoster, Live 04/07/2011        Objective:     BP 136/86   Pulse 73   Temp 97.9 F (36.6 C) (Temporal)   Ht 5' 7 (1.702 m)   Wt 187 lb 6.4 oz (85 kg)   SpO2 99%   BMI 29.35 kg/m   SpO2: 99 %  GENERAL: Obese woman, no acute distress, fully ambulatory.  No conversational dyspnea.  Looks younger than stated age. HEAD: Normocephalic, atraumatic.  EYES: Pupils equal, round, reactive to light.  No scleral icterus.  MOUTH: Oral mucosa moist.  Clear postnasal drip noted. NECK: Supple. Trachea midline. No JVD.  No adenopathy.  Well-healed thyroidectomy scar. PULMONARY: Good air entry  bilaterally.  Lungs clear to auscultation bilaterally. CARDIOVASCULAR: S1 and S2. Regular rate and rhythm.  No rubs, murmurs or gallops. GASTROINTESTINAL: Benign. MUSCULOSKELETAL: No joint deformity, no clubbing, no edema.  NEUROLOGIC: No focal deficits, no gait disturbance.  Speech is fluent.  Appears forgetful at times. SKIN: Intact,warm,dry.  Limited exam, no rashes PSYCH: Mood and behavior appropriate.   Assessment & Plan:     ICD-10-CM   1. Chronic cough  R05.3     2. Cough variant asthma  J45.991     3. Perennial allergic rhinitis  J30.89     4. LPRD (laryngopharyngeal reflux disease)  K21.9     5. Mild cognitive impairment  G31.84     6. OSA on CPAP  G47.33    Followed through [facility] ordered this encounter  Medications   albuterol  (VENTOLIN   HFA) 108 (90 Base) MCG/ACT inhaler    Sig: Inhale 2 puffs into the lungs every 6 (six) hours as needed.    Dispense:  8 g    Refill:  2   Discussion:    Cough variant asthma Cough variant asthma is contributing to her cough.  Postnasal drip also contributes to her cough.  She is using Advair inhaler regularly, cough has actually reduced significantly. The importance of consistent inhaler use to control asthma symptoms was emphasized, along with rinsing her mouth post-use to prevent oral side effects. - Continue Advair inhaler as prescribed - Ensure she rinses mouth after inhaler use  Memory impairment Memory impairment persists, likely related to previous COVID-19 infection. She reports minor memory issues, such as difficulty recalling small details, but can perform daily activities like driving without getting lost. She acknowledges the impact of the virus and expresses gratitude for her current health status.      Advised if symptoms do not improve or worsen, to please contact office for sooner follow up or seek emergency care.    I spent 30 minutes of dedicated to the care of this patient on the date of this encounter to include pre-visit review of records, face-to-face time with the patient discussing conditions above, post visit ordering of testing, clinical documentation with the electronic health record, making appropriate referrals as documented, and communicating necessary findings to members of the patients care team.     C. Leita Sanders, MD Advanced Bronchoscopy PCCM [facility]    *This note was generated using voice recognition software/Dragon and/or AI transcription program.  Despite best efforts to proofread, errors can occur which can change the meaning. Any transcriptional errors that result from this process are unintentional and may not be fully corrected at the time of dictation.

## 2024-05-23 ENCOUNTER — Other Ambulatory Visit: Payer: Self-pay | Admitting: Family Medicine

## 2024-05-23 DIAGNOSIS — Z1231 Encounter for screening mammogram for malignant neoplasm of breast: Secondary | ICD-10-CM

## 2024-05-27 ENCOUNTER — Encounter

## 2024-07-12 ENCOUNTER — Encounter: Payer: Self-pay | Admitting: Family Medicine

## 2024-07-12 ENCOUNTER — Ambulatory Visit: Admitting: Family Medicine

## 2024-07-12 VITALS — BP 122/82 | HR 87 | Resp 16 | Ht 67.0 in | Wt 184.3 lb

## 2024-07-12 DIAGNOSIS — I739 Peripheral vascular disease, unspecified: Secondary | ICD-10-CM | POA: Diagnosis not present

## 2024-07-12 DIAGNOSIS — E890 Postprocedural hypothyroidism: Secondary | ICD-10-CM | POA: Diagnosis not present

## 2024-07-12 DIAGNOSIS — G3184 Mild cognitive impairment, so stated: Secondary | ICD-10-CM | POA: Diagnosis not present

## 2024-07-12 DIAGNOSIS — J45991 Cough variant asthma: Secondary | ICD-10-CM

## 2024-07-12 DIAGNOSIS — R053 Chronic cough: Secondary | ICD-10-CM

## 2024-07-12 DIAGNOSIS — Z780 Asymptomatic menopausal state: Secondary | ICD-10-CM

## 2024-07-12 DIAGNOSIS — N1831 Chronic kidney disease, stage 3a: Secondary | ICD-10-CM | POA: Diagnosis not present

## 2024-07-12 DIAGNOSIS — M8580 Other specified disorders of bone density and structure, unspecified site: Secondary | ICD-10-CM

## 2024-07-12 DIAGNOSIS — E7800 Pure hypercholesterolemia, unspecified: Secondary | ICD-10-CM

## 2024-07-12 DIAGNOSIS — F325 Major depressive disorder, single episode, in full remission: Secondary | ICD-10-CM

## 2024-07-12 DIAGNOSIS — Z1231 Encounter for screening mammogram for malignant neoplasm of breast: Secondary | ICD-10-CM

## 2024-07-12 DIAGNOSIS — F5101 Primary insomnia: Secondary | ICD-10-CM

## 2024-07-12 NOTE — Patient Instructions (Signed)
 [employer] app so your family can track you

## 2024-07-12 NOTE — Progress Notes (Signed)
 Name: Lauren Johnston   MRN: [id number]    DOB: 17-Mar-1951   Date:07/12/2024       Progress Note  Subjective  Chief Complaint  Chief Complaint  Patient presents with   Medical Management of Chronic Issues   Discussed the use of AI scribe software for clinical note transcription with the patient, who gave verbal consent to proceed.  History of Present Illness Lauren Johnston is a 73 year old female with mild cognitive impairment and suspected Alzheimer's disease pathology who presents for a regular follow-up visit.  She experiences ongoing memory issues, particularly repeating conversations with her husband shortly after having them. Despite these lapses, she maintains independence in driving, paying bills, and grocery shopping. She has had testing showing reduced beta amyloid 42 to 40 ratio and elevated PTAL 181. She has a significant family history of dementia.  She uses a CPAP machine for sleep apnea, wearing it every night, although she sometimes removes it when coughing. A new face mask fits better and helps her keep the machine on longer.  She has a history of major depression and is taking duloxetine  60 mg daily, which helps her maintain a good emotional state most of the time.  She underwent thyroidectomy and is on levothyroxine , though she is unsure of the dose. Her TSH was slightly suppressed in September, and she follows up with an endocrinologist for management.  She has dyslipidemia and is taking atorvastatin . She has a history of small vessel disease and pure hypercholesterolemia.  She experiences a chronic cough and has been diagnosed with cough variant asthma, perennial allergic rhinitis, and laryngopharyngeal reflux disease. She was prescribed Advair for the asthma and Pepcid  for the reflux, though she does not use Advair daily. She finds that taking Delsym at night helps her sleep better by reducing coughing.  She has a history of chronic kidney disease and her kidney  function has recently been between 48 and 55. She reports using Tylenol  for pain management and avoids NSAIDs like naproxen . She uses Voltaren  for knee arthritis.  She has a history of osteopenia.    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 10/15/2020   Pure hypercholesterolemia 10/15/2020   Stage 3a chronic kidney disease (HCC) 10/15/2020   Mild cognitive impairment 10/15/2020   S/P total thyroidectomy 06/18/2020   Class 1 obesity in adult 11/11/2016   Osteopenia 10/09/2016   Rectal polyp    Right wrist tendonitis 05/16/2016   Osteoarthritis of left hip 05/16/2016   Vitamin D  deficiency 02/05/2016   Degeneration of intervertebral disc of cervical region 02/05/2016   Insomnia 04/23/2015   Major depression in remission (HCC) 04/23/2015   Chronic pain 04/23/2015   Goiter, nontoxic, 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   Hypothyroidism, 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   Uterine leiomyoma 06/16/2007    Past Surgical History:  Procedure Laterality Date   ABDOMINAL HYSTERECTOMY     BILATERAL OOPHORECTOMY     COLONOSCOPY WITH PROPOFOL  N/A 06/02/2016   Procedure: COLONOSCOPY WITH PROPOFOL ;  Surgeon: Rogelia Copping, MD;  Location: [facility];  Service: Endoscopy;  Laterality: N/A;   COLONOSCOPY WITH PROPOFOL  N/A 06/21/2021  Procedure: COLONOSCOPY WITH PROPOFOL ;  Surgeon: Jinny Carmine, MD;  Location: [facility];  Service: Endoscopy;  Laterality: N/A;   dequavian tendonitis Left    KNEE ARTHROSCOPY Bilateral    POLYPECTOMY  06/02/2016   Procedure: POLYPECTOMY;  Surgeon: Carmine Jinny, MD;  Location: [facility];   Service: Endoscopy;;   THYROIDECTOMY N/A 06/18/2020   Procedure: THYROIDECTOMY, total;  Surgeon: Marolyn Nest, MD;  Location: [facility];  Service: General;  Laterality: N/A;   TONSILLECTOMY     AGE 15 OR 11    Family History  Problem Relation Age of Onset   Diabetes Mother    Heart disease Mother    Emphysema Father    Heart disease Father    COPD Father    Multiple myeloma Brother    Alzheimer's disease Sister    Dementia Sister     Social History   Tobacco Use   Smoking status: Never   Smokeless tobacco: Never  Substance Use Topics   Alcohol use: No    Alcohol/week: 0.0 standard drinks of alcohol     Current Outpatient Medications:    acetaminophen  (TYLENOL ) 500 MG tablet, Take 500 mg by mouth every 6 (six) hours as needed., Disp: , Rfl:    albuterol  (VENTOLIN  HFA) 108 (90 Base) MCG/ACT inhaler, Inhale 2 puffs into the lungs every 6 (six) hours as needed., Disp: 8 g, Rfl: 2   Artificial Tear Solution (SYSTANE CONTACTS) SOLN, Apply to eye., Disp: , Rfl:    atorvastatin  (LIPITOR) 40 MG tablet, Take 1 tablet (40 mg total) by mouth daily., Disp: 90 tablet, Rfl: 1   Biotin w/ Vitamins C & E (HAIR SKIN & NAILS GUMMIES PO), Take 2 tablets by mouth every other day., Disp: , Rfl:    Cholecalciferol  (VITAMIN D ) 50 MCG (2000 UT) CAPS, Take 1 capsule (2,000 Units total) by mouth daily., Disp: 100 capsule, Rfl: 1   Cyanocobalamin  (B-12) 500 MCG SUBL, Place 1 tablet under the tongue daily at 12 noon., Disp: 100 tablet, Rfl: 1   diclofenac  Sodium (VOLTAREN ) 1 % GEL, Apply 4 g topically 4 (four) times daily., Disp: 300 g, Rfl: 0   DULoxetine  (CYMBALTA ) 60 MG capsule, Take 1 capsule (60 mg total) by mouth every morning., Disp: 90 capsule, Rfl: 1   estradiol  (ESTRACE ) 0.5 MG tablet, Take 1 tablet (0.5 mg total) by mouth daily., Disp: 90 tablet, Rfl: 1   famotidine  (PEPCID ) 20 MG tablet, Take 1 tablet (20 mg total) by mouth at bedtime., Disp: 30 tablet, Rfl: 1   fluticasone  (FLONASE ) 50  MCG/ACT nasal spray, Place 2 sprays into both nostrils every evening., Disp: 48 g, Rfl: 1   fluticasone -salmeterol (ADVAIR DISKUS) 250-50 MCG/ACT AEPB, Inhale 1 puff into the lungs in the morning and at bedtime., Disp: 60 each, Rfl: 11   levothyroxine  (SYNTHROID ) 100 MCG tablet, Take 1 tablet (100 mcg total) by mouth in the morning. Take 30-60 minutes before breakfast, Disp: 30 tablet, Rfl: 5   memantine (NAMENDA) 10 MG tablet, Take 1 tablet by mouth daily., Disp: , Rfl:    naproxen  (NAPROSYN ) 500 MG tablet, Take 1 tablet (500 mg total) by mouth 2 (two) times daily with a meal., Disp: 20 tablet, Rfl: 0   traZODone  (DESYREL ) 50 MG tablet, Take 1 tablet (50 mg total) by mouth at bedtime as needed for sleep., Disp: 90 tablet, Rfl: 1  Allergies  Allergen Reactions   Codeine Itching   Sulfamethoxazole-Trimethoprim Other (See Comments) and Rash   Citalopram Itching  Omeprazole  Palpitations    Also weakness   Tetracycline Other (See Comments)    Other Reaction: URTICARIA   Tetracyclines & Related Itching    I personally reviewed active problem list, medication list, allergies, family history with the patient/caregiver today.   ROS  Ten systems reviewed and is negative except as mentioned in HPI    Objective Physical Exam  CONSTITUTIONAL: Patient appears well-developed and well-nourished. No distress. HEENT: Head atraumatic, normocephalic, neck supple. CARDIOVASCULAR: Normal rate, regular rhythm and normal heart sounds. No murmur heard. No BLE edema. No swelling in extremities. PULMONARY: Effort normal and breath sounds normal. No respiratory distress. ABDOMINAL: There is no tenderness or distention. MUSCULOSKELETAL: Normal gait. Without gross motor or sensory deficit. PSYCHIATRIC: Patient has a normal mood and affect. Behavior is normal. Judgment and thought content normal.  Vitals:   07/12/24 1308  BP: 122/82  Pulse: 87  Resp: 16  SpO2: 97%  Weight: 184 lb 4.8 oz (83.6 kg)   Height: 5' 7 (1.702 m)    Body mass index is 28.87 kg/m.   PHQ2/9:    07/12/2024    1:06 PM 03/09/2024    2:13 PM 10/06/2023   10:41 AM 06/30/2023    9:43 AM 12/24/2022    9:52 AM  Depression screen PHQ 2/9  Decreased Interest 0 0 0 2 3  Down, Depressed, Hopeless 0 0 0 1 1  PHQ - 2 Score 0 0 0 3 4  Altered sleeping 0 0 0 1 3  Tired, decreased energy 0 2 0 1 0  Change in appetite 0 2 0 0 0  Feeling bad or failure about yourself  0 0 0 0 0  Trouble concentrating 0 0 0 0 3  Moving slowly or fidgety/restless 0 0 0 0 0  Suicidal thoughts 0 0 0 0 0  PHQ-9 Score 0 4  0  5  10   Difficult doing work/chores Not difficult at all Not difficult at all Not difficult at all       Data saved with a previous flowsheet row definition    phq 9 is negative  Fall Risk:    07/12/2024    1:01 PM 03/09/2024    2:06 PM 10/06/2023   10:41 AM 06/30/2023    9:43 AM 12/24/2022    9:51 AM  Fall Risk   Falls in the past year? 0 0 1 0 0  Number falls in past yr: 0 0 0 0 0  Injury with Fall? 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 Family history of dementia. Continues on donepezil. Advised on lifestyle modifications to support memory. - Continue donepezil 10 mg twice daily. - Encouraged physical activity, socialization, healthy diet, and adequate sleep. - Consider using [id number] app for family tracking.  Chronic kidney disease, stage 3a Chronic kidney disease stage 3a with kidney function between 48 and 55. Advised to avoid NSAIDs. - Avoid NSAIDs such as naproxen , Aleve , and  Motrin . - Use Tylenol  for pain management.  Post-surgical hypothyroidism Post-surgical hypothyroidism with slightly suppressed TSH. Under endocrinologist care. - Continue follow-up with endocrinologist for TSH management.  Pure hypercholesterolemia Well-controlled with  atorvastatin . Recent cholesterol panel showed good control. - Continue atorvastatin  as prescribed.  Cough variant asthma, chronic cough, laryngopharyngeal reflux disease, and perennial allergic rhinitis Chronic cough due to cough variant asthma, perennial allergic rhinitis, and laryngopharyngeal reflux disease. Prescribed Advair and Pepcid . Uses Delsym for nighttime cough relief. - Use Advair discus daily for cough management. - Continue Pepcid  20 mg at night. - Consider Delsym for nighttime cough relief.  Osteopenia post menopausal  Discussed bone health and potential need for bone density testing. - Will schedule bone density test concurrently with mammogram.  Primary insomnia Managed with Delsym for nighttime cough and sleep aid. - Continue Delsym for nighttime cough and sleep aid.  Major depressive disorder in full remission Major depressive disorder in full remission. Continues on duloxetine  with good emotional well-being. - Continue duloxetine  60 mg daily.  Obstructive sleep apnea, on CPAP Obstructive sleep apnea managed with CPAP. Recent equipment adjustment improved fit and function. - Continue CPAP use nightly.

## 2024-08-17 ENCOUNTER — Inpatient Hospital Stay: Admission: RE | Admit: 2024-08-17 | Discharge: 2024-08-17 | Attending: Family Medicine | Admitting: Family Medicine

## 2024-08-17 ENCOUNTER — Ambulatory Visit: Payer: Self-pay | Admitting: Family Medicine

## 2024-08-17 DIAGNOSIS — Z1231 Encounter for screening mammogram for malignant neoplasm of breast: Secondary | ICD-10-CM | POA: Diagnosis present

## 2024-08-17 DIAGNOSIS — M8580 Other specified disorders of bone density and structure, unspecified site: Secondary | ICD-10-CM | POA: Diagnosis present

## 2024-08-17 DIAGNOSIS — Z780 Asymptomatic menopausal state: Secondary | ICD-10-CM | POA: Diagnosis present

## 2024-08-23 ENCOUNTER — Ambulatory Visit

## 2024-08-30 ENCOUNTER — Other Ambulatory Visit: Payer: Self-pay | Admitting: Nurse Practitioner

## 2024-08-30 ENCOUNTER — Other Ambulatory Visit: Payer: Self-pay | Admitting: Pulmonary Disease

## 2024-08-30 DIAGNOSIS — F5101 Primary insomnia: Secondary | ICD-10-CM

## 2024-08-30 DIAGNOSIS — F331 Major depressive disorder, recurrent, moderate: Secondary | ICD-10-CM

## 2024-08-30 DIAGNOSIS — J45991 Cough variant asthma: Secondary | ICD-10-CM

## 2024-08-30 DIAGNOSIS — Z780 Asymptomatic menopausal state: Secondary | ICD-10-CM

## 2024-08-30 DIAGNOSIS — E7800 Pure hypercholesterolemia, unspecified: Secondary | ICD-10-CM

## 2024-08-30 DIAGNOSIS — R053 Chronic cough: Secondary | ICD-10-CM

## 2024-09-01 NOTE — Telephone Encounter (Signed)
 Requested Prescriptions  Pending Prescriptions Disp Refills   atorvastatin  (LIPITOR) 40 MG tablet [Pharmacy Med Name: ATORVASTATIN  40MG  TAB] 90 tablet 1    Sig: TAKE ONE TABLET BY MOUTH EVERY DAY     Cardiovascular:  Antilipid - Statins Failed - 09/01/2024  9:22 AM      Failed - Lipid Panel in normal range within the last 12 months    Cholesterol, Total  Date Value Ref Range Status  03/06/2016 161 100 - 199 mg/dL Final   Cholesterol  Date Value Ref Range Status  03/09/2024 170 <200 mg/dL Final   LDL Cholesterol (Calc)  Date Value Ref Range Status  03/09/2024 99 mg/dL (calc) Final    Comment:    Reference range: <100 . Desirable range <100 mg/dL for primary prevention;   <70 mg/dL for patients with CHD or diabetic patients  with > or = 2 CHD risk factors. SABRA LDL-C is now calculated using the Martin-Hopkins  calculation, which is a validated novel method providing  better accuracy than the Friedewald equation in the  estimation of LDL-C.  Gladis APPLETHWAITE et al. SANDREA. 7986;689(80): 2061-2068  (http://education.[facility].com/faq/FAQ164)    HDL  Date Value Ref Range Status  03/09/2024 56 > OR = 50 mg/dL Final  92/93/7982 55 >60 mg/dL Final   Triglycerides  Date Value Ref Range Status  03/09/2024 66 <150 mg/dL Final         Passed - Patient is not pregnant      Passed - Valid encounter within last 12 months    Recent Outpatient Visits           1 month ago Stage 3a chronic kidney disease (HCC)   [place] [facility] Glenard Mire, [place]   5 months ago Chronic cough   [facility] Gareth Mliss FALCON, FNP   11 months ago History of fall   [facility] Sowles, Krichna, MD               DULoxetine  (CYMBALTA ) 60 MG capsule [Pharmacy Med Name: DULOXETINE  HCL 60MG  EC CAP] 90 capsule 0    Sig: TAKE 1 CAPSULE BY MOUTH EVERY DAY IN THE MORNING     Psychiatry: Antidepressants - SNRI - duloxetine  Failed -  09/01/2024  9:22 AM      Failed - Cr in normal range and within 360 days    Creat  Date Value Ref Range Status  03/09/2024 1.20 (H) 0.60 - 1.00 mg/dL Final         Passed - eGFR is 30 or above and within 360 days    GFR, Est African American  Date Value Ref Range Status  10/15/2020 71 > OR = 60 mL/min/1.73m2 Final   GFR, Est Non African American  Date Value Ref Range Status  10/15/2020 61 > OR = 60 mL/min/1.79m2 Final   eGFR  Date Value Ref Range Status  03/09/2024 48 (L) > OR = 60 mL/min/1.71m2 Final         Passed - Completed PHQ-2 or PHQ-9 in the last 360 days      Passed - Last BP in normal range    BP Readings from Last 1 Encounters:  07/12/24 122/82         Passed - Valid encounter within last 6 months    Recent Outpatient Visits           1 month ago Stage 3a chronic kidney disease (HCC)   [place] Glenard Mire, MD   5 months ago Chronic cough   [facility] Gareth Mliss FALCON, [place]   11 months ago History of fall   [facility], Krichna, MD               traZODone  (DESYREL ) 50 MG tablet [Pharmacy Med Name: TRAZODONE  HCL 50MG  TAB] 90 tablet 0    Sig: TAKE ONE TABLET BY MOUTH EVERY DAY AT BEDTIME AS NEEDED FOR SLEEP     Psychiatry: Antidepressants - Serotonin Modulator Passed - 09/01/2024  9:22 AM      Passed - Completed PHQ-2 or PHQ-9 in the last 360 days      Passed - Valid encounter within last 6 months    Recent Outpatient Visits           1 month ago Stage 3a chronic kidney disease [facility])   [facility] Glenard Mire, MD   5 months ago Chronic cough   [facility] Gareth Mliss FALCON, FNP   11 months ago History of fall   [facility] Sowles, Krichna, MD               estradiol  (ESTRACE ) 0.5 MG tablet [Pharmacy Med Name: ESTRADIOL  0.5MG  TAB] 90 tablet 1    Sig: TAKE ONE TABLET BY  MOUTH EVERY DAY     OB/GYN:  Estrogens Passed - 09/01/2024  9:22 AM      Passed - Mammogram is up-to-date per Health Maintenance      Passed - Last BP in normal range    BP Readings from Last 1 Encounters:  07/12/24 122/82         Passed - Valid encounter within last 12 months    Recent Outpatient Visits           1 month ago Stage 3a chronic kidney disease [facility])   [facility] Glenard Mire, MD   5 months ago Chronic cough   [facility] Gareth Mliss FALCON, FNP   11 months ago History of fall   [facility] Sowles, Krichna, MD

## 2024-09-23 ENCOUNTER — Ambulatory Visit

## 2024-09-23 DIAGNOSIS — Z0000 Encounter for general adult medical examination without abnormal findings: Secondary | ICD-10-CM

## 2024-09-23 NOTE — Progress Notes (Signed)
 "  Chief Complaint  Patient presents with   Medicare Wellness     Subjective:   Lauren Johnston is a 74 y.o. female who presents for a Medicare Annual Wellness Visit.  Visit info / Clinical Intake: Medicare Wellness Visit Type:: Subsequent Annual Wellness Visit Persons participating in visit and providing information:: patient Medicare Wellness Visit Mode:: Telephone If telephone:: video declined Since this visit was completed virtually, some vitals may be partially provided or unavailable. Missing vitals are due to the limitations of the virtual format.: Unable to obtain vitals - no equipment If Telephone or Video please confirm:: I connected with patient using audio/video enable telemedicine. I verified patient identity with two identifiers, discussed telehealth limitations, and patient agreed to proceed. Patient Location:: home Provider Location:: office Interpreter Needed?: No Pre-visit prep was completed: yes AWV questionnaire completed by patient prior to visit?: no Living arrangements:: lives with spouse/significant other Patient's Overall Health Status Rating: excellent Typical amount of pain: some (KNEE & THUMB) Does pain affect daily life?: no Are you currently prescribed opioids?: no  Dietary Habits and Nutritional Risks How many meals a day?: 3 Eats fruit and vegetables daily?: yes Most meals are obtained by: preparing own meals In the last 2 weeks, have you had any of the following?: none Diabetic:: no  Functional Status Activities of Daily Living (to include ambulation/medication): Independent Ambulation: Independent Medication Administration: Independent Home Management (perform basic housework or laundry): Independent Manage your own finances?: yes Primary transportation is: driving Concerns about vision?: no *vision screening is required for [facility]* (READERS- HASN'T BEEN TO MD IN A WHILE, CAN'T REMEMBER WHO IT IS) Concerns about hearing?: no  Fall  Screening Falls in the past year?: 1 Number of falls in past year: 0 Was there an injury with Fall?: 0 Fall Risk Category Calculator: 1 Patient Fall Risk Level: Low Fall Risk  Fall Risk Patient at Risk for Falls Due to: History of fall(s) Fall risk Follow up: Falls evaluation completed; Falls prevention discussed  Home and Transportation Safety: All rugs have non-skid backing?: yes All stairs or steps have railings?: yes Grab bars in the bathtub or shower?: yes Have non-skid surface in bathtub or shower?: yes Good home lighting?: yes Regular seat belt use?: yes Hospital stays in the last year:: no  Cognitive Assessment Difficulty concentrating, remembering, or making decisions? : yes Will 6CIT or Mini Cog be Completed: no 6CIT or Mini Cog Declined: patient has a diagnosis of dementia or cognitive impairment  Advance Directives (For Healthcare) Does Patient Have a Medical Advance Directive?: No Would patient like information on creating a medical advance directive?: No - Patient declined  Reviewed/Updated  Reviewed/Updated: Reviewed All (Medical, Surgical, Family, Medications, Allergies, Care Teams, Patient Goals)    Allergies (verified) Codeine, Sulfamethoxazole-trimethoprim, Citalopram, Omeprazole , Tetracycline, and Tetracyclines & related   Current Medications (verified) Outpatient Encounter Medications as of 09/23/2024  Medication Sig   acetaminophen  (TYLENOL ) 500 MG tablet Take 500 mg by mouth every 6 (six) hours as needed.   albuterol  (VENTOLIN  HFA) 108 (90 Base) MCG/ACT inhaler INHALE 2 PUFFS BY MOUTH EVERY 6 HOURS AS NEEDED   Artificial Tear Solution (SYSTANE CONTACTS) SOLN Apply to eye.   atorvastatin  (LIPITOR) 40 MG tablet TAKE ONE TABLET BY MOUTH EVERY DAY   Biotin w/ Vitamins C & E (HAIR SKIN & NAILS GUMMIES PO) Take 2 tablets by mouth every other day.   Cholecalciferol  (VITAMIN D ) 50 MCG (2000 UT) CAPS Take 1 capsule (2,000 Units total) by mouth daily.  Cyanocobalamin  (B-12) 500 MCG SUBL Place 1 tablet under the tongue daily at 12 noon.   DULoxetine  (CYMBALTA ) 60 MG capsule TAKE 1 CAPSULE BY MOUTH EVERY DAY IN THE MORNING   estradiol  (ESTRACE ) 0.5 MG tablet TAKE ONE TABLET BY MOUTH EVERY DAY   fluticasone  (FLONASE ) 50 MCG/ACT nasal spray Place 2 sprays into both nostrils every evening.   fluticasone -salmeterol (ADVAIR DISKUS) 250-50 MCG/ACT AEPB Inhale 1 puff into the lungs in the morning and at bedtime.   levothyroxine  (SYNTHROID ) 100 MCG tablet Take 1 tablet (100 mcg total) by mouth in the morning. Take 30-60 minutes before breakfast   memantine (NAMENDA) 10 MG tablet Take 1 tablet by mouth daily.   traZODone  (DESYREL ) 50 MG tablet TAKE ONE TABLET BY MOUTH EVERY DAY AT BEDTIME AS NEEDED FOR SLEEP   diclofenac  Sodium (VOLTAREN ) 1 % GEL Apply 4 g topically 4 (four) times daily.   famotidine  (PEPCID ) 20 MG tablet Take 1 tablet (20 mg total) by mouth at bedtime. (Patient not taking: Reported on 09/23/2024)   No facility-administered encounter medications on file as of 09/23/2024.    History: Past Medical History:  Diagnosis Date   Arthritis    hips   Chronic kidney disease    STAGE 3   Dental crowns present    dental implants - upper   Depression    GERD (gastroesophageal reflux disease)    Hyperlipidemia    Hypothyroidism    Thyroid  disease    Past Surgical History:  Procedure Laterality Date   ABDOMINAL HYSTERECTOMY     BILATERAL OOPHORECTOMY     COLONOSCOPY WITH PROPOFOL  N/A 06/02/2016   Procedure: COLONOSCOPY WITH PROPOFOL ;  Surgeon: Rogelia Copping, MD;  Location: [facility];  Service: Endoscopy;  Laterality: N/A;   COLONOSCOPY WITH PROPOFOL  N/A 06/21/2021   Procedure: COLONOSCOPY WITH PROPOFOL ;  Surgeon: Copping Rogelia, MD;  Location: [facility];  Service: Endoscopy;  Laterality: N/A;   dequavian tendonitis Left    KNEE ARTHROSCOPY Bilateral    POLYPECTOMY  06/02/2016   Procedure: POLYPECTOMY;  Surgeon: Rogelia Copping, MD;  Location: [facility];  Service: Endoscopy;;   THYROIDECTOMY N/A 06/18/2020   Procedure: THYROIDECTOMY, total;  Surgeon: Marolyn Nest, MD;  Location: [facility];  Service: General;  Laterality: N/A;   TONSILLECTOMY     AGE 33 OR 11   Family History  Problem Relation Age of Onset   Diabetes Mother    Heart disease Mother    Emphysema Father    Heart disease Father    COPD Father    Multiple myeloma Brother    Alzheimer's disease Sister    Dementia Sister    Social History   Occupational History   Occupation: retired  Tobacco Use   Smoking status: Never   Smokeless tobacco: Never  Vaping Use   Vaping status: Never Used  Substance and Sexual Activity   Alcohol use: No    Alcohol/week: 0.0 standard drinks of alcohol   Drug use: No   Sexual activity: Yes    Partners: Male    Birth control/protection: Post-menopausal    Comment: Hysterectomy   Tobacco Counseling Counseling given: Not Answered  SDOH Screenings   Food Insecurity: No Food Insecurity (09/23/2024)  Housing: Unknown (09/23/2024)  Transportation Needs: No Transportation Needs (09/23/2024)  Utilities: Not At Risk (09/23/2024)  Alcohol Screen: Low Risk (08/20/2022)  Depression (PHQ2-9): Low Risk (09/23/2024)  Financial Resource Strain: Low Risk (08/20/2022)  Physical Activity: Insufficiently Active (09/23/2024)  Social Connections: Moderately Integrated (  09/23/2024)  Stress: No Stress Concern Present (09/23/2024)  Tobacco Use: Low Risk (09/23/2024)  Health Literacy: Adequate Health Literacy (09/23/2024)   See flowsheets for full screening details  Depression Screen PHQ 2 & 9 Depression Scale- Over the past 2 weeks, how often have you been bothered by any of the following problems? Little interest or pleasure in doing things: 0 Feeling down, depressed, or hopeless (PHQ Adolescent also includes...irritable): 0 PHQ-2 Total Score: 0 Trouble falling or staying asleep, or sleeping too much:  0 Feeling tired or having little energy: 0 Poor appetite or overeating (PHQ Adolescent also includes...weight loss): 0 Feeling bad about yourself - or that you are a failure or have let yourself or your family down: 0 Trouble concentrating on things, such as reading the newspaper or watching television (PHQ Adolescent also includes...like school work): 0 Moving or speaking so slowly that other people could have noticed. Or the opposite - being so fidgety or restless that you have been moving around a lot more than usual: 0 Thoughts that you would be better off dead, or of hurting yourself in some way: 0 PHQ-9 Total Score: 0 If you checked off any problems, how difficult have these problems made it for you to do your work, take care of things at home, or get along with other people?: Not difficult at all  Depression Treatment Depression Interventions/Treatment : EYV7-0 Score <4 Follow-up Not Indicated     Goals Addressed             This Visit's Progress    DIET - EAT MORE FRUITS AND VEGETABLES               Objective:    There were no vitals filed for this visit. There is no height or weight on file to calculate BMI.  Hearing/Vision screen No results found. Immunizations and Health Maintenance Health Maintenance  Topic Date Due   COVID-19 Vaccine (5 - 2025-26 season) 07/14/2024   Zoster Vaccines- Shingrix  (1 of 2) 10/12/2024 (Originally 08/11/1970)   Medicare Annual Wellness (AWV)  09/23/2025   Colonoscopy  06/21/2026   Mammogram  08/17/2026   Bone Density Scan  08/17/2029   DTaP/Tdap/Td (3 - Td or Tdap) 04/19/2031   Pneumococcal Vaccine: 50+ Years  Completed   Influenza Vaccine  Completed   Hepatitis C Screening  Completed   Meningococcal B Vaccine  Aged Out        Assessment/Plan:  This is a routine wellness examination for Lauren Johnston.  Patient Care Team: Sowles, Krichna, MD as PCP - General (Family Medicine) Damian Therisa HERO, MD as Physician Assistant  (Endocrinology) Leora Lynwood SAUNDERS, MD as Consulting Physician (Orthopedic Surgery) Maree Jannett POUR, MD as Consulting Physician (Neurology)  I have personally reviewed and noted the following in the patients chart:   Medical and social history Use of alcohol, tobacco or illicit drugs  Current medications and supplements including opioid prescriptions. Functional ability and status Nutritional status Physical activity Advanced directives List of other physicians Hospitalizations, surgeries, and ER visits in previous 12 months Vitals Screenings to include cognitive, depression, and falls Referrals and appointments  No orders of the defined types were placed in this encounter.  In addition, I have reviewed and discussed with patient certain preventive protocols, quality metrics, and best practice recommendations. A written personalized care plan for preventive services as well as general preventive health recommendations were provided to patient.   Jhonnie GORMAN Das, LPN   8/76/7973   Return in about 1 year (  around 09/23/2025).  After Visit Summary: (MyChart) Due to this being a telephonic visit, the after visit summary with patients personalized plan was offered to patient via MyChart   Nurse Notes: UTD ON SHOTS EXCEPT SHINGRIX ; UTD ON MAMMOGRAM & BDS & COLONOSCOPY  No voiced or noted concerns at this time  "

## 2024-09-23 NOTE — Patient Instructions (Addendum)
 Lauren Johnston,  Thank you for taking the time for your Medicare Wellness Visit. I appreciate your continued commitment to your health goals. Please review the care plan we discussed, and feel free to reach out if I can assist you further.  Please note that Annual Wellness Visits do not include a physical exam. Some assessments may be limited, especially if the visit was conducted virtually. If needed, we may recommend an in-person follow-up with your provider.  Ongoing Care Seeing your primary care provider every 3 to 6 months helps us  monitor your health and provide consistent, personalized care. 12/12/24 @ 11:00 AM APPT W/ DR.SOWLES  Referrals If a referral was made during today's visit and you haven't received any updates within two weeks, please contact the referred provider directly to check on the status.  Recommended Screenings:  Health Maintenance  Topic Date Due   Medicare Annual Wellness Visit  08/08/2023   COVID-19 Vaccine (5 - 2025-26 season) 07/14/2024   Zoster (Shingles) Vaccine (1 of 2) 10/12/2024*   Colon Cancer Screening  06/21/2026   Breast Cancer Screening  08/17/2026   Osteoporosis screening with Bone Density Scan  08/17/2029   DTaP/Tdap/Td vaccine (3 - Td or Tdap) 04/19/2031   Pneumococcal Vaccine for age over 63  Completed   Flu Shot  Completed   Hepatitis C Screening  Completed   Meningitis B Vaccine  Aged Out  *Topic was postponed. The date shown is not the original due date.     Vision: Annual vision screenings are recommended for early detection of glaucoma, cataracts, and diabetic retinopathy. These exams can also reveal signs of chronic conditions such as diabetes and high blood pressure.  Dental: Annual dental screenings help detect early signs of oral cancer, gum disease, and other conditions linked to overall health, including heart disease and diabetes.  Please see the attached documents for additional preventive care recommendations.   NEXT AWV  09/28/25 @ 2:20 PM IN PERSON

## 2024-12-12 ENCOUNTER — Ambulatory Visit: Admitting: Family Medicine

## 2025-09-28 ENCOUNTER — Ambulatory Visit
